# Patient Record
Sex: Male | Born: 1945
Health system: Southern US, Community
[De-identification: ages and names within clinical notes are randomized; demographics above are authoritative.]

## PROBLEM LIST (undated history)

## (undated) DIAGNOSIS — J449 Chronic obstructive pulmonary disease, unspecified: Secondary | ICD-10-CM

## (undated) DIAGNOSIS — I10 Essential (primary) hypertension: Secondary | ICD-10-CM

## (undated) DIAGNOSIS — E119 Type 2 diabetes mellitus without complications: Secondary | ICD-10-CM

## (undated) DIAGNOSIS — N400 Enlarged prostate without lower urinary tract symptoms: Secondary | ICD-10-CM

## (undated) DIAGNOSIS — K219 Gastro-esophageal reflux disease without esophagitis: Secondary | ICD-10-CM

## (undated) DIAGNOSIS — F419 Anxiety disorder, unspecified: Secondary | ICD-10-CM

## (undated) DIAGNOSIS — M1990 Unspecified osteoarthritis, unspecified site: Secondary | ICD-10-CM

## (undated) DIAGNOSIS — F329 Major depressive disorder, single episode, unspecified: Secondary | ICD-10-CM

## (undated) DIAGNOSIS — C8310 Mantle cell lymphoma, unspecified site: Secondary | ICD-10-CM

## (undated) DIAGNOSIS — F32A Depression, unspecified: Secondary | ICD-10-CM

## (undated) HISTORY — DX: Essential (primary) hypertension: I10

## (undated) HISTORY — PX: CERVICAL FUSION: SHX112

## (undated) HISTORY — DX: Chronic obstructive pulmonary disease, unspecified: J44.9

## (undated) HISTORY — DX: Depression, unspecified: F32.A

## (undated) HISTORY — DX: Mantle cell lymphoma, unspecified site: C83.10

## (undated) HISTORY — PX: OTHER SURGICAL HISTORY: SHX169

## (undated) HISTORY — DX: Major depressive disorder, single episode, unspecified: F32.9

## (undated) HISTORY — DX: Anxiety disorder, unspecified: F41.9

## (undated) HISTORY — PX: THYROIDECTOMY, PARTIAL: SHX18

## (undated) HISTORY — PX: TONSILLECTOMY: SUR1361

## (undated) HISTORY — DX: Benign prostatic hyperplasia without lower urinary tract symptoms: N40.0

---

## 1998-02-23 ENCOUNTER — Inpatient Hospital Stay (HOSPITAL_COMMUNITY): Admission: AD | Admit: 1998-02-23 | Discharge: 1998-02-25 | Payer: Self-pay | Admitting: Cardiology

## 1998-04-14 ENCOUNTER — Ambulatory Visit (HOSPITAL_COMMUNITY): Admission: RE | Admit: 1998-04-14 | Discharge: 1998-04-14 | Payer: Self-pay | Admitting: *Deleted

## 1998-04-14 ENCOUNTER — Encounter: Payer: Self-pay | Admitting: *Deleted

## 1998-12-21 ENCOUNTER — Inpatient Hospital Stay (HOSPITAL_COMMUNITY): Admission: EM | Admit: 1998-12-21 | Discharge: 1998-12-22 | Payer: Self-pay | Admitting: Cardiology

## 2001-12-04 ENCOUNTER — Ambulatory Visit (HOSPITAL_COMMUNITY): Admission: RE | Admit: 2001-12-04 | Discharge: 2001-12-04 | Payer: Self-pay | Admitting: Orthopaedic Surgery

## 2004-01-27 ENCOUNTER — Ambulatory Visit (HOSPITAL_COMMUNITY): Admission: RE | Admit: 2004-01-27 | Discharge: 2004-01-27 | Payer: Self-pay | Admitting: Family Medicine

## 2004-12-20 ENCOUNTER — Ambulatory Visit (HOSPITAL_COMMUNITY): Admission: RE | Admit: 2004-12-20 | Discharge: 2004-12-20 | Payer: Self-pay | Admitting: Podiatry

## 2005-03-05 ENCOUNTER — Ambulatory Visit (HOSPITAL_COMMUNITY): Admission: RE | Admit: 2005-03-05 | Discharge: 2005-03-05 | Payer: Self-pay | Admitting: Orthopaedic Surgery

## 2005-03-12 ENCOUNTER — Ambulatory Visit (HOSPITAL_COMMUNITY): Admission: RE | Admit: 2005-03-12 | Discharge: 2005-03-12 | Payer: Self-pay | Admitting: Orthopaedic Surgery

## 2005-03-12 ENCOUNTER — Encounter (INDEPENDENT_AMBULATORY_CARE_PROVIDER_SITE_OTHER): Payer: Self-pay | Admitting: Orthopaedic Surgery

## 2005-05-04 ENCOUNTER — Ambulatory Visit (HOSPITAL_COMMUNITY): Admission: RE | Admit: 2005-05-04 | Discharge: 2005-05-04 | Payer: Self-pay | Admitting: Orthopaedic Surgery

## 2005-05-10 ENCOUNTER — Ambulatory Visit (HOSPITAL_COMMUNITY): Admission: RE | Admit: 2005-05-10 | Discharge: 2005-05-10 | Payer: Self-pay | Admitting: Orthopaedic Surgery

## 2005-05-10 ENCOUNTER — Encounter (INDEPENDENT_AMBULATORY_CARE_PROVIDER_SITE_OTHER): Payer: Self-pay | Admitting: Orthopaedic Surgery

## 2005-05-17 ENCOUNTER — Ambulatory Visit (HOSPITAL_COMMUNITY): Admission: RE | Admit: 2005-05-17 | Discharge: 2005-05-17 | Payer: Self-pay | Admitting: Orthopaedic Surgery

## 2005-10-02 ENCOUNTER — Inpatient Hospital Stay (HOSPITAL_COMMUNITY): Admission: RE | Admit: 2005-10-02 | Discharge: 2005-10-06 | Payer: Self-pay | Admitting: Orthopaedic Surgery

## 2005-10-02 ENCOUNTER — Encounter (INDEPENDENT_AMBULATORY_CARE_PROVIDER_SITE_OTHER): Payer: Self-pay | Admitting: *Deleted

## 2006-05-15 ENCOUNTER — Ambulatory Visit: Payer: Self-pay | Admitting: Cardiology

## 2006-05-20 ENCOUNTER — Ambulatory Visit: Payer: Self-pay

## 2008-01-02 ENCOUNTER — Ambulatory Visit (HOSPITAL_COMMUNITY): Admission: RE | Admit: 2008-01-02 | Discharge: 2008-01-02 | Payer: Self-pay | Admitting: Orthopaedic Surgery

## 2008-03-25 ENCOUNTER — Inpatient Hospital Stay (HOSPITAL_COMMUNITY): Admission: RE | Admit: 2008-03-25 | Discharge: 2008-03-30 | Payer: Self-pay | Admitting: Orthopaedic Surgery

## 2009-04-26 ENCOUNTER — Encounter: Payer: Self-pay | Admitting: Cardiology

## 2009-04-27 ENCOUNTER — Ambulatory Visit (HOSPITAL_COMMUNITY): Admission: RE | Admit: 2009-04-27 | Discharge: 2009-04-27 | Payer: Self-pay | Admitting: Internal Medicine

## 2009-05-03 DIAGNOSIS — E669 Obesity, unspecified: Secondary | ICD-10-CM

## 2009-05-03 DIAGNOSIS — F17200 Nicotine dependence, unspecified, uncomplicated: Secondary | ICD-10-CM | POA: Insufficient documentation

## 2009-05-03 DIAGNOSIS — Z8679 Personal history of other diseases of the circulatory system: Secondary | ICD-10-CM | POA: Insufficient documentation

## 2009-05-03 DIAGNOSIS — I2510 Atherosclerotic heart disease of native coronary artery without angina pectoris: Secondary | ICD-10-CM

## 2009-05-03 DIAGNOSIS — I10 Essential (primary) hypertension: Secondary | ICD-10-CM | POA: Insufficient documentation

## 2009-05-19 ENCOUNTER — Ambulatory Visit (HOSPITAL_COMMUNITY): Admission: RE | Admit: 2009-05-19 | Discharge: 2009-05-19 | Payer: Self-pay | Admitting: Otolaryngology

## 2009-06-28 ENCOUNTER — Inpatient Hospital Stay (HOSPITAL_COMMUNITY): Admission: RE | Admit: 2009-06-28 | Discharge: 2009-07-01 | Payer: Self-pay | Admitting: Neurosurgery

## 2010-04-30 ENCOUNTER — Encounter: Payer: Self-pay | Admitting: Otolaryngology

## 2010-05-09 NOTE — Progress Notes (Signed)
Summary: Dr Keane Police Office Note  Dr Keane [employer] Office Note   Imported By: Roderic Ovens 09/28/2009 12:56:02  _____________________________________________________________________  External Attachment:    Type:   Image     Comment:   External Document

## 2010-06-29 ENCOUNTER — Other Ambulatory Visit (HOSPITAL_COMMUNITY): Payer: Self-pay | Admitting: Internal Medicine

## 2010-06-29 ENCOUNTER — Ambulatory Visit (HOSPITAL_COMMUNITY)
Admission: RE | Admit: 2010-06-29 | Discharge: 2010-06-29 | Disposition: A | Discharge status: Home / Self Care | Payer: BC Managed Care – PPO | Source: Ambulatory Visit | Attending: Internal Medicine | Admitting: Internal Medicine

## 2010-06-29 DIAGNOSIS — R0789 Other chest pain: Secondary | ICD-10-CM | POA: Insufficient documentation

## 2010-06-29 DIAGNOSIS — R05 Cough: Secondary | ICD-10-CM

## 2010-06-29 DIAGNOSIS — R52 Pain, unspecified: Secondary | ICD-10-CM

## 2010-06-29 DIAGNOSIS — J449 Chronic obstructive pulmonary disease, unspecified: Secondary | ICD-10-CM | POA: Insufficient documentation

## 2010-06-29 DIAGNOSIS — J4489 Other specified chronic obstructive pulmonary disease: Secondary | ICD-10-CM | POA: Insufficient documentation

## 2010-06-29 DIAGNOSIS — R059 Cough, unspecified: Secondary | ICD-10-CM | POA: Insufficient documentation

## 2010-07-03 LAB — CBC
HCT: 48.6 % (ref 39.0–52.0)
Hemoglobin: 16.4 g/dL (ref 13.0–17.0)
RBC: 5.34 MIL/uL (ref 4.22–5.81)
WBC: 9.1 10*3/uL (ref 4.0–10.5)

## 2010-07-03 LAB — BASIC METABOLIC PANEL
BUN: 18 mg/dL (ref 6–23)
CO2: 29 mEq/L (ref 19–32)
Calcium: 9.3 mg/dL (ref 8.4–10.5)
GFR calc Af Amer: >60 mL/min (ref >60)
GFR calc non Af Amer: >60 mL/min (ref >60)
Sodium: 134 mEq/L — ABNORMAL LOW (ref 135–145)

## 2010-07-03 LAB — SURGICAL PCR SCREEN: MRSA, PCR: NEGATIVE

## 2010-07-13 ENCOUNTER — Encounter (HOSPITAL_COMMUNITY): Payer: Medicare Other

## 2010-07-13 ENCOUNTER — Ambulatory Visit (HOSPITAL_COMMUNITY)
Admission: RE | Admit: 2010-07-13 | Discharge: 2010-07-13 | Disposition: A | Discharge status: Home / Self Care | Payer: Medicare Other | Source: Ambulatory Visit | Attending: Internal Medicine | Admitting: Internal Medicine

## 2010-07-13 DIAGNOSIS — R0609 Other forms of dyspnea: Secondary | ICD-10-CM | POA: Insufficient documentation

## 2010-07-13 DIAGNOSIS — R0989 Other specified symptoms and signs involving the circulatory and respiratory systems: Secondary | ICD-10-CM | POA: Insufficient documentation

## 2010-07-20 ENCOUNTER — Ambulatory Visit (INDEPENDENT_AMBULATORY_CARE_PROVIDER_SITE_OTHER): Payer: Medicare Other | Admitting: Gastroenterology

## 2010-07-20 ENCOUNTER — Encounter: Payer: Self-pay | Admitting: Gastroenterology

## 2010-07-20 VITALS — BP 156/86 | HR 70 | Temp 98.0°F | Ht 73.0 in | Wt 275.4 lb

## 2010-07-20 DIAGNOSIS — R1012 Left upper quadrant pain: Secondary | ICD-10-CM

## 2010-07-20 DIAGNOSIS — Z1211 Encounter for screening for malignant neoplasm of colon: Secondary | ICD-10-CM

## 2010-07-20 NOTE — Patient Instructions (Signed)
We have set you up for a colonoscopy. We will also retrieve your records from your colonoscopy in the past.  Continue to take Prilosec daily. At the time of the colonoscopy, we will decide if we need to add an upper endoscopy.  In the meantime, you may use heatpacks and extra strength tylenol for possible muscle strain.  No changes to medications

## 2010-07-20 NOTE — Progress Notes (Signed)
Referring Provider: Dwana Melena, MD Primary Care Physician:  Dwana Melena, MD Primary Gastroenterologist:  Dr. Jena Gauss  Chief Complaint  Patient presents with  . Pre-op Exam    colonoscopy    HPI:  Chris Lopez is a 65 y.o. male here as a referral from Dr. Margo Aye for a colonoscopy. Pt reports last colonoscopy >10 years ago; thinks it was done at [facility], however there is no record of this. He reports BM daily, no melena or brbpr, no abdominal pain. Does report LUQ pain, starting in January after episode of bronchitis with severe episodes of coughing. waxes and wanes, but somewhat underlying. Tenderness when pressing on it. Denies pain with eating, denies reflux. No dysphagia. LUQ discomfort worsened with movement/exertion. Riding in a car makes it more uncomfortable. Uses a pillow to hold against his abdomen.  Given Prilosec samples by PCP on April 4th. Has noticed some improvement of discomfort. No BC, Goodys, Ibuprofen.   Past Medical History  Diagnosis Date  . Hypertension   . Depression   . Anxiety   . COPD (chronic obstructive pulmonary disease)   . Chronic bronchitis   . BPH (benign prostatic hyperplasia)     Past Surgical History  Procedure Date  . Left knee replacement   . Left knee revised x 3   . Right knee replacement   . Tarsal tunnel repair   . Carpal tunnel right hand   . Cervical fusion   . Thyroidectomy, partial   . Tonsillectomy   . Multiple knee arthroscopies     Current Outpatient Prescriptions  Medication Sig Dispense Refill  . albuterol (VENTOLIN HFA) 108 (90 BASE) MCG/ACT inhaler Inhale 2 puffs into the lungs every 6 (six) hours as needed.        Marland Kitchen aspirin 325 MG tablet Take 325 mg by mouth daily.        . diazepam (VALIUM) 10 MG tablet Take 10 mg by mouth every 6 (six) hours as needed.        . diclofenac (VOLTAREN) 75 MG EC tablet Take 75 mg by mouth 2 (two) times daily.        . metoprolol (TOPROL-XL) 50 MG 24 hr tablet Take 50 mg by mouth daily.          . nitroGLYCERIN (NITROSTAT) 0.4 MG SL tablet Place 0.4 mg under the tongue every 5 (five) minutes as needed.        Marland Kitchen omeprazole (PRILOSEC) 20 MG capsule Take 20 mg by mouth daily.        Marland Kitchen oxyCODONE-acetaminophen (PERCOCET) 7.5-325 MG per tablet Take 1 tablet by mouth every 4 (four) hours as needed.        . pravastatin (PRAVACHOL) 40 MG tablet Take 40 mg by mouth daily.        . Tamsulosin HCl (FLOMAX) 0.4 MG CAPS Take 0.4 mg by mouth daily.        Marland Kitchen venlafaxine (EFFEXOR-XR) 150 MG 24 hr capsule Take 150 mg by mouth daily.        Marland Kitchen zolpidem (AMBIEN CR) 12.5 MG CR tablet Take 12.5 mg by mouth at bedtime as needed.          Allergies as of 07/20/2010 - Review Complete 07/20/2010  Allergen Reaction Noted  . Feldene (piroxicam) Rash 07/20/2010    Family History  Problem Relation Age of Onset  . Colon cancer Neg Hx     History   Social History  . Marital Status: Married   Social History Main  Topics  . Smoking status: Former Smoker -- 1.0 packs/day for 40 years    Types: Cigarettes    Quit date: 06/29/2010  . Smokeless tobacco: Never Used   Comment: using electronic cigarettes  . Alcohol Use: No  . Drug Use: No  . Sexually Active: Yes -- Male partner(s)    Birth Control/ Protection: None     spouse     Review of Systems: Gen: Denies any fever, chills, sweats, anorexia, fatigue, weakness, malaise, weight loss, and sleep disorder CV: Denies chest pain, angina, palpitations, syncope, orthopnea, PND, peripheral edema, and claudication. Resp: Denies dyspnea at rest, dyspnea with exercise, cough, sputum, wheezing, coughing up blood, and pleurisy. GI: See HPI GU : Denies urinary burning, blood in urine, urinary frequency, urinary hesitancy, nocturnal urination, and urinary incontinence. MS: Denies joint pain, limitation of movement, and swelling, stiffness, low back pain, extremity pain. Denies muscle weakness, cramps, atrophy.  Derm: Denies rash, itching, dry skin, hives,  moles, warts, or unhealing ulcers.  Psych: Denies depression, anxiety, memory loss, suicidal ideation, hallucinations, paranoia, and confusion. Heme: Denies bruising, bleeding, and enlarged lymph nodes.  Physical Exam: BP 156/86  Pulse 70  Temp 98 F (36.7 C)  Ht 6\' 1"  (1.854 m)  Wt 275 lb 6.4 oz (124.921 kg)  BMI 36.33 kg/m2  SpO2 96% General:   Alert,  Well-developed, well-nourished, pleasant and cooperative in NAD Head:  Normocephalic and atraumatic. Eyes:  Sclera clear, no icterus.   Conjunctiva pink. Ears:  Normal auditory acuity. Nose:  No deformity, discharge,  or lesions. Mouth:  No deformity or lesions, dentition normal. Neck:  Supple; no masses or thyromegaly. Lungs:  Clear throughout to auscultation.   No wheezes, crackles, or rhonchi. No acute distress. Heart:  Regular rate and rhythm; no murmurs, clicks, rubs,  or gallops. Abdomen:  +BS, soft, obese, non-distended, mildly TTP LUQ. No masses or hernias noted. No HSM. Rectal:  Deferred until time of colonoscopy.   Msk:  Symmetrical without gross deformities. Normal posture. Pulses:  Normal pulses noted. Extremities:  Without clubbing or edema. Neurologic:  Alert and  oriented x4;  grossly normal neurologically. Skin:  Intact without significant lesions or rashes. Psych:  Alert and cooperative. Normal mood and affect.

## 2010-07-24 ENCOUNTER — Encounter: Payer: Self-pay | Admitting: Gastroenterology

## 2010-07-24 DIAGNOSIS — Z1211 Encounter for screening for malignant neoplasm of colon: Secondary | ICD-10-CM | POA: Insufficient documentation

## 2010-07-24 DIAGNOSIS — R1012 Left upper quadrant pain: Secondary | ICD-10-CM | POA: Insufficient documentation

## 2010-07-24 NOTE — Assessment & Plan Note (Signed)
65 year old pleasant Caucasian male with a remote hx of prior colonoscopy; unable to find records. BM daily, no melena or hematochezia. No lower abdominal pain. No loss of appetite or wt loss. Will proceed with routine screening colonoscopy in near future.   Proceed with TCS with Dr. Jena Gauss in near future: the risks, benefits, and alternatives have been discussed with the patient in detail. The patient states understanding and desires to proceed.

## 2010-07-24 NOTE — Assessment & Plan Note (Addendum)
Onset in Jan of this year after severe episodes of coughing r/t bronchitis. Pt tender to palpation on exam. Yet, no exacerbation with eating/drinking. No reflux, dysphagia/odynophagia. However, Prilosec started April 4th with some mild improvement in symptoms. Pt seems to believe this is musculoskeletal in nature, which may be quite likely. However, will continue Prilosec daily, use therapeutic intervention such as heat packs and Tylenol prn. At time of colonoscopy, if no improvement, consider EGD.   The R/B/A of possible upper endoscopy was discussed with pt at time of visit. He states understanding. He was also offered a CT scan, but he has declined at this time.

## 2010-08-07 ENCOUNTER — Other Ambulatory Visit: Payer: Self-pay | Admitting: Internal Medicine

## 2010-08-07 ENCOUNTER — Ambulatory Visit (HOSPITAL_COMMUNITY)
Admission: RE | Admit: 2010-08-07 | Discharge: 2010-08-07 | Disposition: A | Discharge status: Home / Self Care | Payer: BC Managed Care – HMO | Source: Ambulatory Visit | Attending: Internal Medicine | Admitting: Internal Medicine

## 2010-08-07 ENCOUNTER — Encounter: Payer: Medicare Other | Admitting: Internal Medicine

## 2010-08-07 DIAGNOSIS — R1013 Epigastric pain: Secondary | ICD-10-CM | POA: Insufficient documentation

## 2010-08-07 DIAGNOSIS — D129 Benign neoplasm of anus and anal canal: Secondary | ICD-10-CM | POA: Insufficient documentation

## 2010-08-07 DIAGNOSIS — D128 Benign neoplasm of rectum: Secondary | ICD-10-CM | POA: Insufficient documentation

## 2010-08-07 DIAGNOSIS — I10 Essential (primary) hypertension: Secondary | ICD-10-CM | POA: Insufficient documentation

## 2010-08-07 DIAGNOSIS — Z1211 Encounter for screening for malignant neoplasm of colon: Secondary | ICD-10-CM | POA: Insufficient documentation

## 2010-08-07 DIAGNOSIS — K449 Diaphragmatic hernia without obstruction or gangrene: Secondary | ICD-10-CM | POA: Insufficient documentation

## 2010-08-07 DIAGNOSIS — K5730 Diverticulosis of large intestine without perforation or abscess without bleeding: Secondary | ICD-10-CM | POA: Insufficient documentation

## 2010-08-08 NOTE — Op Note (Signed)
NAME:  Chris Lopez, ERVEN                ACCOUNT NO.:  [id number]  MEDICAL RECORD NO.:  [id number]           PATIENT TYPE:  O  LOCATION:  DAYP                          FACILITY:  [facility]  PHYSICIAN:  R. Roetta Sessions, M.D. DATE OF BIRTH:  1945-05-20  DATE OF PROCEDURE:  08/07/2010 DATE OF DISCHARGE:                              OPERATIVE REPORT   PROCEDURE:  EGD with biopsy followed by colonoscopy with biopsy.  INDICATIONS FOR PROCEDURE:  A 65 year old gentleman is due for routine screening colonoscopy.  No family history of polyps or colon cancer. The last colonoscopy over 10 years ago.  No lower GI tract symptoms, has had some epigastric left upper quadrant abdominal pain for some time which has been felt to be due to musculoskeletal origin.  He has been on Prilosec 20 mg once daily with some improvement in these symptoms, but they continue.  No dysphagia, no hematemesis, nausea, vomiting.  Both EGD and colonoscopy now being done at this time.  Risks, benefits, limitations, alternatives, imponderables have been discussed, questions answered.  Please see the documentation in the medical record.  PROCEDURE NOTE:  O2 saturation, blood pressure, pulse, respirations were monitored throughout the entirety of the procedure.  CONSCIOUS SEDATION:  Versed 9 mg IV, Demerol 125 mg IV in divided doses, Phenergan 12.5 mg dilute slow IV push to augment conscious sedation. Cetacaine spray for topical pharyngeal anesthesia.  FINDINGS:  EGD examination of tubular esophagus revealed circumferential distal esophageal erosions within 5 mm of the GE junction.  There is no Barrett's esophagus or other abnormality.  EG junction was patent, easily traversed.  Stomach:  Gastric cavity was emptied and insufflated well with air. Thorough examination of gastric mucosa including retroflexion of proximal stomach, esophagogastric junction demonstrated antral erosions and submucosal petechiae and erosions in the  body and fundus as well. No ulcer infiltrating process.  There was a small hiatal hernia on retroflexion.  Pylorus was patent, easily traversed.  Examination of the bulb and second portion revealed two 1-cm pale yellow submucosal nodules consistent with lipomas, otherwise duodenum appeared normal through the second portion.  THERAPEUTIC/DIAGNOSTIC MANEUVERS PERFORMED:  Biopsies of the antrum and body were taken for H. pylori.  The patient tolerated this procedure, was repaired for colonoscopy.  Digital rectal exam revealed no abnormalities.  Endoscopic findings:  Prep was suboptimal, but doable. Colon:  Colonic mucosa was surveyed from the rectosigmoid junction through the left transverse right colon to the appendiceal orifice, ileocecal valve/cecum.  These structures were well seen and photographed for the record.  From this level, scope was slowly withdrawn.  All previously mentioned mucosal surfaces were again seen.  The patient has extensive left-sided diverticulum.  Remainder of colonic mucosa, however appeared normal.  Scope was pulled down to the rectum where a thorough examination of rectal mucosa including retroflexed view of the anal verge demonstrated two diminutive polyps which were cold biopsied/removed.  The patient tolerated this procedure well.  Cecal withdrawal time 10 minutes.  IMPRESSION: 1. EGD, distal esophageal erosions consistent with erosive reflux     esophagitis may have much to do with the patient's recent  upper     abdominal discomfort. 2. Small hiatal hernia gastric erosions described above status post     biopsy lipomatous lesions in the duodenum.  Colonoscopy findings,     diminutive rectal polyp status post cold biopsy removal, otherwise     unremarkable rectum. 3. Left-sided diverticula.  Remainder of colonic mucosa appeared     normal.  RECOMMENDATIONS: 1. Increase Prilosec to 20 mg orally twice daily for now. 2. Literature on reflux and hiatal  hernia provided to Mr. Presley. 3. Follow up on path. 4. Further recommendations to follow.     Jonathon Bellows, M.D.     RMR/MEDQ  D:  08/07/2010  T:  08/07/2010  Job:  [id number]  cc:   Catalina Pizza, M.D. Fax: [phone]  Electronically Signed by Lorrin Goodell M.D. on 08/08/2010 07:53:00 PM

## 2010-08-22 NOTE — Consult Note (Signed)
NAME:  Chris Lopez, Chris Lopez                ACCOUNT NO.:  [id number]   MEDICAL RECORD NO.:  [id number]          PATIENT TYPE:  INP   LOCATION:  A332                          FACILITY:  [facility]   PHYSICIAN:  Lonia Blood, M.D.      DATE OF BIRTH:  Sep 07, 1945   DATE OF CONSULTATION:  DATE OF DISCHARGE:                                 CONSULTATION   DATE OF CONSULT:  March 26, 2008.   CONSULT REQUESTED:  [name], MD   REASON FOR CONSULTATION:  Hypertension, dyslipidemia.   HISTORY OF PRESENT ILLNESS:  The patient is a 65 year old gentleman with  known history of degenerative joint disease who was admitted by Dr.  Hilda Lias for right total knee replacement.  The patient has a total knee  replacement done on March 25, 2008.  He has been recovering now in  the hospital.  He has underlying history of hypertension and also has  probably developed thrombocytopenia and other medical issues, hence we  are being consulted.  The patient is currently recuperating.  Denied any  specific complaints.  No dizziness.  He is on bedrest today.   PAST MEDICAL HISTORY:  Significant for hypertension and dyslipidemia.   ALLERGIES:  Feldene.   PAST SURGICAL HISTORY:  Status post multiple total knee surgery on the  left side who usually follows with [facility].   SOCIAL HISTORY:  The patient lives in [place].  Denied any tobacco,  alcohol, or IV drug use.  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  The patient is married, with 3 children.   FAMILY HISTORY:  Positive for premature coronary artery disease in one  of his brothers that died at the age of 90 from a heart attack.   REVIEW OF SYSTEMS:  A 14-point review of systems is negative except per  HPI.   PHYSICAL EXAMINATION:  VITAL SIGNS:  His temperature is 99.1, blood  pressure 125/68, pulse 102, and respiratory rate 20.  His sats 91% on 2  liters.  GENERAL:  The patient is awake, alert, conversant, in no acute  distress.  HEENT:  PERRL.  EOMI.  NECK:  Supple.  No JVD, no lymphadenopathy.  RESPIRATORY:  He has good air entry bilaterally.  No wheezes, no rales.  CARDIOVASCULAR SYSTEM:  The patient has S1 and S2.  No murmur.  ABDOMEN:  Soft, obese, nontender with positive bowel sounds.  EXTREMITIES:  No edema, cyanosis, or clubbing.  The right knee is  currently immobilized with pump in place postoperatively.   CURRENT MEDICATIONS:  1. Albuterol 2.5 mg inhale nebulizer.  2. Dilaudid 7.5 mg IV q.4 h. PCA pump as well as p.r.n. medications.   LABORATORY DATA:  Most recent labs today showed a white count of 9.7,  hemoglobin 12.5, and platelet count of 143.  Sodium 135, potassium 3.8,  chloride 101, CO2 29, glucose 150, BUN 8, creatinine 0.92, and calcium  8.4.   ASSESSMENT:  Therefore, this is consult for [name], status  post total knee replacement on the right, also had his baseline history  of hypertension, although his blood pressure is controlled today.  The  patient also had history of dyslipidemia and currently, has  thrombocytopenia and postoperative anemia.  A thrombocytopenia could be  due to heparin-induced thrombocytopenia or reactionary.  His anemia has  been borderline and could be postoperative in nature.  For his  hypertension, the patient's blood pressure is fully controlled on no  medication at the moment.  He was apparently not taking medicine before  coming in, although last year, he was on amlodipine.   RECOMMENDATIONS:  We will check fasting lipid panel and follow his CBCs  to see if his platelets continue to drop.  We will check screen for  heparin-induced thrombocytopenia.  We will continue his home medication  and continue to hold antihypertensives until his blood pressure rises.  We will follow up with you.  Thank you for the consult.      Lonia Blood, M.D.  Electronically Signed     LG/MEDQ  D:  03/27/2008  T:  03/27/2008  Job:  [id number]

## 2010-08-22 NOTE — H&P (Signed)
NAME:  Chris Lopez, Chris Lopez                ACCOUNT NO.:  [id number]   MEDICAL RECORD NO.:  [id number]          PATIENT TYPE:  AMB   LOCATION:  DAY                           FACILITY:  [facility]   PHYSICIAN:  J. Darreld Mclean, M.D. DATE OF BIRTH:  Feb 17, 1946   DATE OF ADMISSION:  DATE OF DISCHARGE:  LH                              HISTORY & PHYSICAL   CHIEF COMPLAINT:  Severe pain and tenderness in the right knee.   The patient is a 65 year old male with marked degenerative joint disease  of the right knee.  It has gotten progressively worse over the last  several years, so he would like to have a total knee arthroplasty.   He is status post total knee arthroplasty on the right originally done  by me October 02, 2005.  After surgery he did well but then he complained  of pain around the patella that became chronic.  There were no signs of  infection.  His motion was good but he complained of patella pain.  I  had him seen eventually at [facility], where they thought  that he had an oversized patella and in July of 2008 they did a revision  where they removed the patella button and placed a new patella button.  The prosthesis was found to be satisfactory and that was not removed.  Since then, they have done a total revision of the left total knee and  then he was taken to surgery in October of this year where he had  another surgical procedure on the left, with removal of retained bone  and cement fragments.  He is very familiar in other words about total  knee arthroplasty.  He has talked to his surgeon at [facility]  and he would rather have this right total knee done here in [place].  His surgeon feels that he can tolerate increased weight to the left knee  now.  He saw his surgeon just recently.  The patient would like to  proceed with total knee arthroplasty on the right.  Again, he  understands the risks and imponderables of the procedure.   Other than the multiple  procedures on his left knee for the total knee,  he has also had arthroscopy of the left knee in 1993.  He had a  thyroidectomy in 1992.  He has had the right knee arthroscopic surgery  in 1998 in [place], in 2000 in [place], [place], and in 2003 by me.  He  is also status post tarsal tunnel and [place] release in 2006.  The  patient has a history of heart disease.  He has had no significant  problems with his heart or arrhythmias.  He denies any other serious  medical problem except for the multiple knee surgeries.   ALLERGIES:  HE IS ALLERGIC TO FELDENE.   He takes Percocet 7.5/325 for pain, Voltaren 75 mg p.o. b.i.d., diazepam  10 mg p.r.n. spasms, and Ambien 10 mg.   The patient smokes, does not use alcoholic beverages.  Dr. Morrie Sheldon is his  family doctor.  The patient lives  in [place].   VITAL SIGNS:  Normal.  GENERAL:  He is alert, cooperative, and oriented.  HEENT:  Negative.  NECK:  Supple.  LUNGS:  Clear to P and A.  HEART:  Regular rhythm without murmur heard.  ABDOMEN:  Soft, nontender, without masses.  EXTREMITIES:  A well-healed scar, left knee, with good tracking of his  knee, with range of motion from 0 to 95 degrees.  On the right knee, he  has good range of motion from 0 to 90 degrees with pain and tenderness,  and he has a slight varus deformity.  He has crepitus.  The knee is  slightly unstable anteriorly about one-half +.  He has no distal edema.  The other extremities are negative.  CNS:  Neurovascularly intact.   IMPRESSION:  1. Status post multiple total knees on the left.  2. Significant degenerative joint disease of the right knee for a      total knee arthroplasty.   I discussed with the patient the planned procedure risks and  imponderables. He appears to understand.  Labs are pending.   Several years ago the patient had a MRSA infection and because of that,  it has been recommended he have vancomycin given preoperatively.  This  been done at [facility]  on his multiple revisions and it is again  recommended that it be done here through the infection control service  at [facility].  I will honor their request and he will be  having vancomycin preoperatively.                                            ______________________________  Shela Commons. Darreld Mclean, M.D.     JWK/MEDQ  D:  03/24/2008  T:  03/24/2008  Job:  [id number]

## 2010-08-22 NOTE — Group Therapy Note (Signed)
NAME:  Chris Lopez, Chris Lopez                ACCOUNT NO.:  [id number]   MEDICAL RECORD NO.:  [id number]          PATIENT TYPE:  INP   LOCATION:  A332                          FACILITY:  [facility]   PHYSICIAN:  Margaretmary Dys, M.D.DATE OF BIRTH:  07/03/1945   DATE OF PROCEDURE:  03/28/2008  DATE OF DISCHARGE:                                 PROGRESS NOTE   SUBJECTIVE:  The patient feels fairly well.  Patient is status post  right total knee replacement on December 17.  The patient has very  minimal pain.  We are following the patient secondary to hypertension,  dyslipidemia and thrombocytopenia.  The patient's blood pressure has  really remained stable with no concerns at this time.   OBJECTIVE:  Conscious, alert, comfortable, not in acute distress.  Well-  oriented in time, place and person.  VITAL SIGNS:  Blood pressure was 118/69, pulse of 111, respirations 24,  temperature 98.7 degrees Fahrenheit.  Oxygen saturation was 93% on room  air.  HEENT EXAM:  Normocephalic, atraumatic.  Oral mucosa was moist.  No  exudates.  NECK:  Supple.  No JVD, lymphadenopathy.  LUNGS:  Clear clinically, good air entry bilaterally.  HEART:  S1 and S2 regular.  ABDOMEN:  Soft.  EXTREMITIES:  Right leg in a splint, mobile splint machine.  Toes feel  warm.  CNS:  Exam was grossly intact with no focal neurological deficits.   LABORATORY/DIAGNOSTIC DATA:  White blood count was 11.2, hemoglobin  11.6, hematocrit 34.7, platelet count was 148,000.  Sodium is 132,  potassium 3.6, chloride 96,  CO2 was 29, BUN 7, creatinine was 0.77.  Heparin-induced thrombocytopenia panel was negative.   ASSESSMENT/PLAN:  1. Hypertension well-controlled.  2. History of dyslipidemia.  3. Thrombocytopenia, possibly consumptive thrombocytopenia.   PLAN:  1. Will continue to follow his platelet counts at this time.  The      benefit of continuing with Lovenox outweighs the      risks at this time.  His platelet count has actually  showed some      improvement.  2. The patient will likely need rehab.  I will defer this to Dr.      Hilda Lias.   DISPOSITION:  Overall the patient remains stable and from the medical  standpoint, I think we can discharge him.      Margaretmary Dys, M.D.  Electronically Signed     AM/MEDQ  D:  03/28/2008  T:  03/28/2008  Job:  [id number]

## 2010-08-22 NOTE — Op Note (Signed)
NAME:  Chris Lopez, Chris Lopez                ACCOUNT NO.:  [id number]   MEDICAL RECORD NO.:  [id number]          PATIENT TYPE:  INP   LOCATION:  A332                          FACILITY:  [facility]   PHYSICIAN:  J. Darreld Mclean, M.D. DATE OF BIRTH:  12-Mar-1946   DATE OF PROCEDURE:  03/25/2008  DATE OF DISCHARGE:                               OPERATIVE REPORT   PREOPERATIVE DIAGNOSIS:  Severe degenerative joint disease of the right  knee.   POSTOPERATIVE DIAGNOSIS:  Severe degenerative joint disease of the right  knee.   PROCEDURE:  Right knee arthroplasty using a #6 component femoral and #6  component tibial, an 11-mm tibial wafer, and a 29-mm, 9-mm tall patellar  button with methyl methacrylate.   ANESTHESIA:  Spinal.   SURGEON:  J. Darreld Mclean, MD   TOURNIQUET TIME:  79 minutes.  One large Hemovac drain used.  No  apparent complications, no blood given.   The patient is a 65 year old male with severe degenerative joint disease  of the right knee.  He has had a total knee arthroplasty on the left  with 3 revisions.  Please refer to the history and physical.  His has  had trouble with the right.  He would like to go with surgery on the  right knee.  The patient has a history in the past of MRSA and  vancomycin has been recommended and strongly recommended for preop  antibiotics.  The patient understands the risks and imponderables of the  procedure.  I have gone over these on multiple occasions plus he has had  multiple surgeries on the left knee.   DESCRIPTION OF PROCEDURE:  The patient was seen in the holding area.  The right knee was identified as correct surgical site by the patient  and then I put a mark on the right knee.  The patient put a mark on the  right knee.  He was brought to the operating room, given spinal  anesthesia.  He was placed supine.  Tourniquet placed deflated right  upper thigh.  He was prepped and draped in usual manner.  He had a  universal time-out  identifying the patient as Chris Lopez and doing the  right knee for total knee arthroplasty.  All members of the surgical  team knew each other and were introduced.  All instrumentations were  deemed to be present and working properly.   Leg was elevated, wrapped circumferentially with Esmarch bandage,  tourniquet inflated 300 mmHg.  Esmarch bandage removed.  Midline  incision made.  Parapatellar incision was made medially.  Patella was  everted.  Knee had significant degenerative joint disease present in all  compartments.  Initial drill hole was made and the cutting block was  then inserted on the femur.  I elected to a 0-cut at 11 mm.  Cuts were  made.  Tension directed.  The tibia was exposed and an 11-mm was taken  off of the lower side.  A spacing bag was placed for extension of the  test and it fit well without any undue tightness or any undue looseness  medially or laterally.  Attention directed back to the femoral side.  The femoral side was measured, it measured to size 6.  It should be  noted that the tibia was also measured and it measured to size 6 as  well.  The cutting block was made for the size 6.  Cuts were made  anterior-posterior, posterior chamfer, and anterior chamfer.  The  central cutting block was used and a large circular cutting jig was used  with power and then the box osteotome was used.  Femoral side was then  completed.  Trial reduction carried out and fit extremely well.  This  was kept in and then a trial reduction was carried out and a size 9  wafer was used on the tibia and a size 11/11 fit best.  These were all  removed after marking where the tibia rode the best and flexion and  extension was tested and fit very nicely.  Both flexion and extension  without any undue looseness and undue laxity.  The patella was then  prepared.  The patella measured 26 mm, rather large patella.  Cuts were  made to accommodate a 9-mm wafer.  Cut was made to 17 mm,  rechecked with  a caliber, was smoothened and leveled.  The 29 button was selected.  Drill holes were made.  The femur, tibia, and patellar area were  debrided with [place] while the methyl methacrylate layer was mixed up  by myself.  It was placed in a caulk gun after mixing and under vacuum.  The tibial component was put in first, impacted, excess glue removed.  Femoral component was placed, impacted, excess glue removed.  The tibial  wafer 11 mm was then inserted with excellent visualization and impacted,  fit very well.  Attention directed to patella button and this was then  inserted and compression applied to it with a special gage.  After glue  hardness was removed.  Patella tracked very nicely after putting these  few sutures and using a no-thumb technique.  There was no excess  looseness and that rode very well, clotted very well, and did not seem  to be extra big.  Hemovac drain was placed, sewn with 2-0 silk.  The  fascia was reapproximated using interrupted figure-of-eight #1 Surgilon  suture.  X-rays were taken.  On the true AP and true lateral view they  looked good and we will repeat these in recovery we will get true AP and  true lateral views.  There was no access bone cement seen or access bony  particles left.  Tourniquet was deflated for 79 minutes and then  subcutaneous tissue was examined and no excessive bleeding in this was  repaired using a 2-0 plain in layers.  Staples were then used and closed  the wound.  Sterile dressing was applied and a cryo cuff applied.  Hemovac was put to dissection.  The patient tolerated the procedure well  and went to recovery in good condition.  We placed in a CPM machine.  Morphine PCA has been ordered.  I will have a hospitalist see him.  We  will continue the vancomycin for one more dose later this evening.  Enoxaparin has been ordered postoperatively.           ______________________________  Shela Commons. Darreld Mclean, M.D.      JWK/MEDQ  D:  03/25/2008  T:  03/26/2008  Job:  [id number]

## 2010-08-25 NOTE — Op Note (Signed)
NAME:  Chris Lopez, Chris Lopez                ACCOUNT NO.:  [id number]   MEDICAL RECORD NO.:  [id number]          PATIENT TYPE:  AMB   LOCATION:  DAY                           FACILITY:  [facility]   PHYSICIAN:  Denny Peon. Ulice Brilliant, D.P.M.  DATE OF BIRTH:  May 15, 1945   DATE OF PROCEDURE:  12/20/2004  DATE OF DISCHARGE:                                 OPERATIVE REPORT   PREOPERATIVE DIAGNOSIS:  Plantar fasciitis, left foot. Tarsal tunnel  symptoms, left medial ankle and foot.   POSTOPERATIVE DIAGNOSIS:  Plantar fasciitis, left foot. Tarsal tunnel  symptoms, left medial ankle and foot.   PROCEDURE PERFORMED:  Endoscopic plantar fasciotomy, left foot. Tarsal  tunnel release, left medial ankle.   SURGEON:  Denny Peon. Ulice Brilliant, D.P.M.   ANESTHESIA:  Spinal.   INDICATIONS FOR SURGERY:  Several month to two-year history of pain in  medial and central and plantar heel with classic plantar fasciitis heel spur  symptoms and secondly burning pain in medial ankle extending into heel and  lateral aspect of the foot with some neuritis/anesthesia symptoms. He is  noted clinically to have absent epicritic sensation of the fourth and fifth  digits suggestive of a lateral plantar nerve entrapment.   DESCRIPTION OF PROCEDURE:  Arul is brought into the OR and placed on the  table in the supine position. He is then rotated over onto his side where  spinal anesthesia is induced. Within two minutes, the anesthesia had taken  effect. Local anesthesia is obtained about the medial aspect of his left  ankle utilizing 1% lidocaine with epinephrine 1:100,000. The purpose for the  lidocaine with epinephrine is for hemostasis during the tarsal tunnel  release. Following the injection of lidocaine with epinephrine, a pneumatic  ankle tourniquet is applied across his left ankle for use during the  endoscopic plantar fasciotomy. The left foot is then prepped and draped for  the EPF in standard aseptic fashion. An Esmarch bandage utilized  to  exsanguinate the foot, and the tourniquet is inflated to 250 mmHg.   PROCEDURE #1:  ENDOSCOPIC PLANTAR FASCIOTOMY OF LEFT FOOT. Attention is  directed to the medial aspect of the first MTP. The medial calcaneal  tubercle is appreciated. One centimeter distal to the palpable portion of  the calcaneal tubercle, a 1-cm vertical incision is made. The incision is  deepened via blunt dissection utilizing a curved Hemostat. A freer elevator  is introduced and utilized to create a channel just inferior to the plantar  fascia working from medial to lateral. This is broadened utilizing the  spatula from the EPF instrumentation. The obturator cannula is driven  through this tunnel, and a separate incision was made on the lateral aspect  of the heel to pass this through. The obturator was removed. The cannula was  rotated so that its slot laid against the fascia. The fiberoptic equipment  is inserted medially. A hook probe was inserted laterally. Prior to the  procedure, I palpated Gagan's heel, and he is painful almost across the  entire width of the heel, worse centrally and medially than laterally. The  lateral  most aspect of the central band of the fascia is approximated, and a  transection is made utilizing the triangle knife in this portion of the  fascia. Working from lateral to medial, the plantar fascia is transected  utilizing the triangle knife. Following this, the fiberoptic equipment is  removed and inserted laterally, and the orientation is reversed. At this  point, any further remaining fibers that were not cut were released. The  intrinsic muscle belly just superior to the fascia is well visualized on the  screen. The cannula was then rotated so that its slot laid plantar, and the  foot is inspected for any remaining fascia here if the cannula had somehow  driven through the body of the fascia itself. There is no ligament noted;  all adipose tissue. The EPF is deemed complete. The  wound is flushed. Both  incisions following removal of the cannula are closed utilizing 4-0 Prolene.  Two x 2s are folded over, and a Tegaderm dressing is applied over the  incision at this point. The tourniquet was then deflated. The patient's left  foot and leg are then removed of draping and the tourniquet and padding  material. The foot is then prepped up to about the lower one third of the  leg in standard fashion and then draped for the tarsal tunnel release.   PROCEDURE #2:  TARSAL TUNNEL RELEASE, LEFT MEDIAL ANKLE. Attention is  directed to the left medial ankle. A 7-cm slight hockey-stick skin incision  is made. The incision is deepened by sharp and blunt dissection through  subcutaneous tissues, all crossing vessels that could not be retracted or  clamped and cauterized. Slow careful blunt dissection is utilized to get  good exposure to the laciniate ligament. The superior and inferior  boundaries of this ligament are appreciated. Working from superior to  inferior, this ligament is then completed transected, utilizing a  combination of Metzenbaum scissors, iris scissors, and #15 blade. Following  complete severing of the ligament, the underlying contents of the tarsal  compartments are visualized. The posterior tibial artery is noted to be very  evident and appreciated. Care is taken that there is no crossing vessels  across the content of the tarsal canal at this point. Care is taken also  that there is good opening of the porta pedis inferiorly. During the  dissection, one of the veins running along the posterior tibial artery is  nicked. This is stopped of bleeding, utilizing cautery. The wound is flushed  with copious amounts of irrigant. No active bleeding is noted. Subcutaneous  tissues are reapproximated and closed, utilizing a horizontal mattress of 4-  0 Vicryl. Skin is closed with 4-0 Vicryl and subcuticular suture. Steri- Strips are applied across this incision. The  Tegaderm dressings are removed,  and the entire left foot and lower leg are dressed using a combination of a  2 x 2s, 3 x 3s, 4 x 4s, and Kerlix dressing. Prior to application of the  dressing material, a postoperative injection is administered to both the  ankle and the heel. Utilizing 0.5% Marcaine and Hexadrol. An Ace bandage is  applied across the Kerlix dressing. Tage was then removed of all draping  material.   Mr. Hal Hope tolerates the anesthesia and procedure well. During the last  portion of the skin closure, he was started to regain feeling in the foot.  While in postoperative recovery, he had complete feeling of the lower aspect  of his foot, and he had really no  pain in the medial ankle or heel secondary  to the Marcaine use. Following the procedure, I had a discussion with his  wife about postoperative care. A prescription for Tylox and Phenergan is  dispensed. He will be seen within seven days for first postoperative visit.           ______________________________  Denny Peon. Ulice Brilliant, D.P.M.     CMD/MEDQ  D:  12/20/2004  T:  12/20/2004  Job:  [id number]

## 2010-08-25 NOTE — H&P (Signed)
NAME:  Chris Lopez, Chris Lopez                ACCOUNT NO.:  [id number]   MEDICAL RECORD NO.:  [id number]          PATIENT TYPE:  AMB   LOCATION:  DAY                           FACILITY:  [facility]   PHYSICIAN:  J. Darreld Mclean, M.D. DATE OF BIRTH:  August 01, 1945   DATE OF ADMISSION:  DATE OF DISCHARGE:  LH                                HISTORY & PHYSICAL   CHIEF COMPLAINT:  My knee hurts, on the left.   The patient is a 65 year old male with pain and tenderness of his left knee  for several weeks.  I saw him in the office, March 05, 2005, with  complaints of pain and tenderness of his left knee that has been going on  for several months.  He recently had surgery by Dr. Ulice Brilliant on his foot but he  has had pain and tenderness to his knee.  Surgery was on the left foot.  There has been giving way of the knee, swelling.  I previously did an  arthroscopy on his right knee in 2003 and he says the left knee feels just  like that did.  I obtained an MRI through the hospital which showed a joint  effusion and tear of the medial and lateral menisci.  There was tri-  compartmental arthritis present.  Surgery is recommended.  He is agreeable  to this.   PAST HISTORY:  Heart disease.   ALLERGIES:  FELDENE.   He takes __________  and Flomax.   He smokes a pack a day.  He does not use alcohol.  He is a Engineer, maintenance (IT).   Dr. Morrie Sheldon is his family doctor.   SURGERY:  1.  Recent surgery on the right foot and heel.  2.  Right knee arthroscopy, by me, 2003.  3.  Right knee, 2000, while he was in [place].  4.  Right knee, 1998, at [facility].  5.  Thyroidectomy, 1992, in [place].  6.  Left knee surgery in 1993 in [place].   Prostate cancer runs in the family.   [place]   PHYSICAL EXAMINATION:  VITAL SIGNS:  Within normal limits.  HEENT:  Negative.  GENERAL:  He is alert, cooperative, oriented.  NECK:  Supple.  LUNGS:  Clear to P&A.  HEART:  Regular without murmur  heard.  ABDOMEN:  Soft, nontender without masses.  EXTREMITIES:  Left knee with slight effusion, tenderness particularly  medially.  Range of motion is good.  Some crepitus is present.  The right  knee is full range of motion.  Other extremities are negative.  CNS:  Intact.  SKIN:  Intact.   IMPRESSION:  1.  Tear of the medial and lateral meniscus of the left knee.  2.  Recent surgery, left foot.   PLAN:  Arthroscopy of the left knee.  Risks and imponderables have been  explained.  Labs are pending.  He understands the procedure having undergone  it on the other side.  ______________________________  Shela Commons. Darreld Mclean, M.D.     JWK/MEDQ  D:  03/09/2005  T:  03/09/2005  Job:  [id number]

## 2010-08-25 NOTE — Assessment & Plan Note (Signed)
[facility]                            CARDIOLOGY OFFICE NOTE   NAME:Chris Lopez                       MRN:          [id number]  DATE:05/15/2006                            DOB:          02-Nov-1945    CHIEF COMPLAINT:  I am having my angina again.   HISTORY OF PRESENT ILLNESS:  [name] is a pleasant 65 year old  gentleman who carries a diagnosis of angina since late 1998.  At that  time he had a heart catheterization which showed a 40% - 50% blockage in  the circumflex.  He pointed this out to me on the heart monitor today.   He had recurrent problems in 2000 and was apparently catheterized at  [facility].  We can not find records of that.  He was told then  that there was no blockage.  He has normal left ventricular function.   Christmas Eve he was under a lot of stress and had severe chest  pressure.  He had a little radiation to the left neck.  There was no  other concomitant symptoms.   He has had some light tach since then.   It is not provoked by exertion or activity.  He is fairly immobile now,  walking with a cane secondary to left foot and left knee surgery.   PAST MEDICAL HISTORY:  Has multiple risk factors including age, sex, and  tobacco use of a pack per day.  He is overweight and very sedentary, he  does not know his lipid status.  He is not diabetic.  He does have some  hypertension treated with amlodipine.  The rest of his past medical  history is significant for left knee replacement 2007, heel spurs on  left foot in 2006, right knee arthroscopy, partial thyroidectomy, and  tonsillectomy.  He is not on thyroid replacement therapy.   CURRENT MEDICATIONS:  1. Amlodipine 5 mg daily.  2. Oxycodone p.r.n.  3. Diclofenac 75 mg post-meals.  4. Aspirin 81 mg a day which is on hold.  5. Diazepam 5 mg two every day.   He does carry nitroglycerin.   HE IS INTOLERANT OF FELDENE.   FAMILY HISTORY:  Positive for  premature coronary disease in a brother  who died at 37 of a heart attack.   SOCIAL HISTORY:  He is a [employer] manager, Emergency planning/management officer.  He is  married and has 3 children.   REVIEW OF SYSTEMS:  Other than the HPI is remarkable for a hiatal  hernia, some urinary frequency problems, arthritis which is chronic,  diabetes which I do not get any history of otherwise, and anxiety.   PHYSICAL EXAMINATION:  He is very pleasant, he is bearded, he is no  acute distress.  He is alert and oriented x3.  Blood pressure is 140/92,  his pulse is 76 and regular, his weight is 267, he is 6 foot 1 inch.  HEENT:  Normocephalic/atraumatic, PERRLA, extraocular movements intact,  sclerae are clear.  He wears glasses, facial symmetry is normal.  NECK:  Supple, carotid upstrokes are equal bilaterally without bruits,  there is no JVD, thyroid is not enlarged, trachea is midline.  LUNGS:  Clear to auscultation.  HEART:  Reveals a soft S1, S2, PMI could not be appreciated.  ABDOMINAL:  Was protuberant, good bowel sounds, there is no tenderness.  Organomegaly could not be assessed.  EXTREMITIES:  Reveal a tender left ankle.  There was no obvious edema.  Pulses, posterior tibias were equal bilaterally.  NEUROLOGIC:  Grossly intact.  SKIN:  Grossly intact.   Electrocardiogram shows normal sinus rhythm and a normal EKG.   ASSESSMENT:  [name] apparently has had some angina in the past.  Catheterization in 1998 apparently showed nonobstructive disease in the  circumflex.  Catheterization in 2000 he was told was normal.   He has multiple cardiac risk factors as he and I discussed today.  We  discussed at length therapeutic lifestyle changes that would have a  favorable influence on his future.   PLAN:  Adenosine stress test Myoview.  If he has substantial ischemia he  will need a heart catheterization.  He and I are hoping we can avoid  this.   He has had lipids and blood work done at [facility].  I have  told him he probably needs a statin with his multiple risk factors.  He  will follow up with them concerning that.     Thomas C. Daleen Squibb, MD, [facility]  Electronically Signed    TCW/MedQ  DD: 05/15/2006  DT: 05/15/2006  Job #: [id number]   cc:   Ernestina Penna, M.D.

## 2010-08-25 NOTE — H&P (Signed)
NAME:  Chris Lopez, Chris Lopez                ACCOUNT NO.:  [id number]   MEDICAL RECORD NO.:  [id number]          PATIENT TYPE:  AMB   LOCATION:  DAY                           FACILITY:  [facility]   PHYSICIAN:  J. Darreld Mclean, M.D. DATE OF BIRTH:  01-Nov-1945   DATE OF ADMISSION:  05/10/2005  DATE OF DISCHARGE:  LH                                HISTORY & PHYSICAL   CHIEF COMPLAINT:  My knee is hurting again.   The patient is a 65 year old male on whom I did knee arthroscopy on his left  knee December 4.  He had a partial medial meniscectomy.  Postoperatively, he  initially did well, but then he started having more pain and tenderness in  this knee after undergoing physical therapy in the last several weeks.  The  pain has not improved.  I have injected the knee which has not helped.  Physical therapy has not helped.  Pain medicine has not helped.  Repeat MRI  has just been done here at the hospital on January 26.  This shows joint  effusion, DJD.  There is some subchondral edema, and he is post partial  meniscectomy.  Radiologist is suspicious there may be a recurrent tear.  Cannot identify displacement or meniscal fragment.   The patient's increased pain with MRI showing changes and no improvement  with his therapy, I think repeat arthroscopy is warranted.  The patient  appears to understand.   The patient is familiar with risks and imponderables.   PAST MEDICAL HISTORY:  Heart disease.   ALLERGIES:  He is allergic to [facility].   CURRENT MEDICATIONS:  BuSpar and Flomax.   He smokes a pack a day and does not use alcohol.  He is a Engineer, maintenance (IT).   Dr. Morrie Sheldon is his family physician.   PREVIOUS SURGERY:  1.  Right foot and heel.  2.  Right knee arthroscopy in 2003.  3.  Left knee arthroscopy this past December 2006.  4.  Right knee surgery in 2000 while in [place].  5.  Right knee surgery in 1998 at [facility].  6.  Thyroidectomy in 1992 in [place].  7.  Left knee surgery in 1993 in  [place].   Prostate cancer runs in the family.   The patient lives in [place].   PHYSICAL EXAMINATION:  VITAL SIGNS: Within normal limits.  HEENT:  Negative.  GENERAL:  Cooperative and oriented.  NECK:  Supple.  LUNGS:  Clear to percussion and auscultation.  HEART: Regular rate and rhythm.  No murmur heard.  ABDOMEN:  Soft, nontender with good bowel sounds.  Slightly obese.  EXTREMITIES:  Left knee has effusion, pain, tenderness medially.  Range of  motion of the knee is very good.  There is crepitus.  Right knee has full  range of motion, no pain, no tenderness.  He has a limp to the left when  walking.  CNS: Intact.  SKIN:  Intact.   IMPRESSION:  Recurrent tear of the left knee medial meniscus.   PLAN:  Discussed with patient plan and procedure, appears to understand  risks and  imponderables.  Surgery is scheduled for February 1.                                            ______________________________  J. Darreld Mclean, M.D.     JWK/MEDQ  D:  05/08/2005  T:  05/08/2005  Job:  [id number]

## 2010-08-25 NOTE — H&P (Signed)
NAME:  Chris Lopez, Chris Lopez                ACCOUNT NO.:  [id number]   MEDICAL RECORD NO.:  [id number]          PATIENT TYPE:  AMB   LOCATION:  DAY                           FACILITY:  [facility]   PHYSICIAN:  Denny Peon. Ulice Brilliant, D.P.M.  DATE OF BIRTH:  [birth year]   DATE OF ADMISSION:  12/20/2004  DATE OF DISCHARGE:  LH                                HISTORY & PHYSICAL   HISTORY OF PRESENT ILLNESS:  Chris Lopez has a longstanding problem with pain in  the left heel, foot, and ankle present since probably Christmas-time of  2004.  He originally was seen in our office April 05, 2004.  He was  diagnosed with plantar fasciitis and tarsal tunnel symptoms of this left  foot and ankle.  He has been treated conservatively with function orthotics,  10-20 BK compression hose, injection therapy, nonsteroidal anti-inflammatory  medication, reduction in activity, all of which have given him really  minimal relief.  Chris Lopez relates symptoms as thus:  When he gets up in the  morning, he has significant pain upon getting out of bed and ambulating.  Once it loosens up, it begins to feel a little better only to worsen as the  day goes on with an intense burning sensation in the medial ankle and going  through the heel up the lateral plantar aspect of the foot.  He relates that  the fourth and fifth toes feel as though they have gone numb.   PAST MEDICAL HISTORY:  Hypertension.   He is on BuSpar at this point, and he takes Flomax for his urological  condition.   He has an allergy to [facility] which causes a rash.  He did, however, tolerate  Celebrex and Diclofenac with no apparent problems.   OBJECTIVE:  MUSCULOSKELETAL:  He is focally tender to palpation of the  medial aspect of this left foot and heel.  He is noted radiographically to  have a small plantar calcaneal spur.  He does have a positive Tinel sign at  the medial ankle with pain radiating to the heel and the foot.  He has  decreased epicritic sensation along the  lateral plantar sole of this left  foot.   ASSESSMENT:  His symptoms are consistent with plantar fasciitis and tarsal  tunnel of the left medial ankle.   PLAN:  Surgical correction will consist of an endoscopic plantar fasciotomy  followed by a posterior tibial nerve decompression.  We will first to the  EPF under a separate draping with the use of a tourniquet.  Following the  completion of this procedure, he will be removed of the draping and of the  tourniquet and we will then proceed with the tarsal tunnel release.  Prior  to both procedures, he will be induced under general anesthesia and we will  couple that with lidocaine with epinephrine for hemostasis for the tarsal  tunnel.  I have discussed the procedure with Baldo Ash.  He has read his consent  form, apparently understood this, and signed.  We have discussed the  possibility of  complications including minimal to no improvement with the  burning pain and  numbness.  I think his heel pain will improve.  He also is described the  usual postoperative course.  He will need to be non-weightbearing on  crutches in a CAM walker.  [name], as stated, has participated in the  discussion and apparently has no further questions.                                            ______________________________  Denny Peon. Ulice Brilliant, D.P.M.     CMD/MEDQ  D:  12/19/2004  T:  12/19/2004  Job:  [id number]

## 2010-08-25 NOTE — H&P (Signed)
NAME:  Chris Lopez, ARMSTRONG                          ACCOUNT NO.:  [id number]   MEDICAL RECORD NO.:  [id number]                   PATIENT TYPE:  AMB   LOCATION:  DAY                                  FACILITY:  [facility]   PHYSICIAN:  J. Darreld Mclean, M.D.              DATE OF BIRTH:  25-May-1945   DATE OF ADMISSION:  DATE OF DISCHARGE:                                HISTORY & PHYSICAL   SUBJECTIVE:  My knee hurts.   HISTORY OF PRESENT ILLNESS:  The patient is a 65 year old male with pain and  tenderness in his right knee that he got injured on the job at work.  The  injury occurred around August 06, 2001 around 1:30 p.m.  He reports to [place] [facility] [place].  He was walking downstairs to the basement of a  building at 1247-D at [employer].  When he got on the last step, his foot  slipped and he jammed his knee.  When he came back down and hit the concrete  floor he had pain and tenderness.  He was eventually seen at [facility] in [place].  X-rays taken there on May 2.  He was seen on  subsequent visits on May 9 as well, and x-rays were taken.  He was first  seen in our office on July 16.  At that time there was concern he had a  meniscal injury.  It should be noted he has had two previous surgeries to  his knee on the right - the first was in March 1998 by Dr. Elana Alm. Wainer  in [place]; in August 2000 he had surgery in [place], [place].   MRI of his knee was done at [facility] which showed tear of the posterior  horn of the medial meniscus to the right knee, mild DJD, small joint  effusion.  It was requested from [employer], planned to have surgery on  the knee, it has been delayed until now, and they have agreed for him to  undergo arthroscopy of the right knee.  Risks and the problems have been  explained to the patient.  He is somewhat familiar with this, having  undergone two surgical procedures.   PAST MEDICAL HISTORY:  The patient has a history of heart  disease and denies  any other medical problems.   ALLERGIES:  FELDENE.   CURRENT MEDICATIONS:  1. BuSpar two a day.  2. Wellbutrin one a day.  3. Vicodin ES for pain.   SOCIAL HISTORY:  He smokes one pack of cigarettes per day and drinks  alcoholic beverages socially.  He has finished high school plus two years of  college.  Dr. Zenda Alpers is his family doctor.   PREVIOUS SURGERY:  Right knee surgery as stated above two times, and  thyroidectomy in 1993.   FAMILY HISTORY:  History of heart disease runs in his family and his  father  had prostate problems.   PHYSICAL EXAMINATION:  VITAL SIGNS:  Blood pressure 120/70, pulse 76,  respirations 16, afebrile.  Height 6 feet three-quarters inches, weight 237.  GENERAL:  The patient is alert, cooperative, and oriented.  HEENT:  Negative.  NECK:  Supple.  LUNGS:  Clear to P&A.  HEART:  Regular rhythm without murmur heard.  ABDOMEN:  Soft, nontender, without masses.  EXTREMITIES:  Right knee is tender, particularly in the medial joint line  with positive McMurray medially.  Range of motion is full.  There is no  crepitus.  Other extremities within normal limits.  CNS:  Intact.  SKIN:  Intact.   IMPRESSION:  Tear of the medial meniscus, right knee.   PLAN:  Operative arthroscopy, right knee.  Risks and problems of the  procedure have been discussed.  We have also discussed spinal anesthesia.  Labs are pending.  The patient appears to understand and agrees with the  procedure as outlined.                                               Chris Lopez, M.D.    JWK/MEDQ  D:  12/03/2001  T:  12/03/2001  Job:  [id number]

## 2010-08-25 NOTE — Discharge Summary (Signed)
NAME:  Chris Lopez, Chris Lopez                ACCOUNT NO.:  [id number]   MEDICAL RECORD NO.:  [id number]          PATIENT TYPE:  INP   LOCATION:  A332                          FACILITY:  [facility]   PHYSICIAN:  J. Darreld Mclean, M.D. DATE OF BIRTH:  29-Jan-1946   DATE OF ADMISSION:  03/25/2008  DATE OF DISCHARGE:  12/22/2009LH                               DISCHARGE SUMMARY   DISCHARGE DIAGNOSIS:  Severe degenerative joint disease of the right  knee.   PROCEDURE PERFORMED:  Total knee arthroplasty right knee.   DISCHARGE STATUS:  Improved.   PROGNOSIS:  Good.   DISPOSITION:  Home, to be seen and followed by Home Health.  The patient  is to use a Cryo/Cuff and is to be followed by [facility].  He is to have physical therapy through them.   DISCHARGE MEDICATIONS:  1. Enoxaparin 40 mg subcutaneous daily for 14 days.  2. Percocet 10/325 every four p.r.n. pain.  3. ProAir inhaler twice a day.  4. Effexor XR 150 mg daily.  5. Aspirin 325 mg daily.  6. Lipitor 40 mg daily.  7. Benazepril 20 mg daily.  8. Ambien 10 mg at night as needed.  9. Voltaren 75 twice a day after eating.  10.Diazepam 10 mg as needed.  11.NitroQuick patch as needed.   The patient was seen my office on January 5 for suture removal.  He is  to call me if any difficulty or any problem.  He has the numbers.   BRIEF HISTORY:  The patient was scheduled for an elective total knee  arthroplasty.  He underwent the above-mentioned procedure the day of  admission and tolerated it very well.  A CPM machine was placed and he  tolerated that well.  I saw him the night of surgery, he was doing well  and the pain was controlled.  I changed him to Dilaudid, as the morphine  did not seem to be as strong as he would like.  The first day after  surgery he was doing well, the pain was controlled, the CPM machine was  working well and he was seen by physical therapy.  He did well on his  first visit, he had previously had a  total knee on the left and was seen  and evaluated by the hospitalist during the stay per my request.  His  Hemovac was removed on the second postoperative day, he remained  afebrile.  His pain was well-controlled.  On the third day, he was doing  well, I discontinued his IV and his IV Dilaudid, I placed him on p.o.  medication.  His Foley was discontinued as well.  By the fourth  postoperative day, he was doing well, he was walking well and plans were  made for discharge.  I arranged home health.  Instructions were care and  use of the walker.  Labs otherwise were negative and normal.  If he has  any difficulty or any problem, he is to contact me through the office or  the hospital beeper system.  Holiday season is approaching and I  told  him that if he had any difficulty he could call through the hospital and  if I was not on-call that Dr. Romeo Apple would see him.  The patient  understood.                                            ______________________________  Shela Commons. Darreld Mclean, M.D.     JWK/MEDQ  D:  05/06/2008  T:  05/06/2008  Job:  [id number]

## 2010-08-25 NOTE — Op Note (Signed)
NAME:  TREYTEN, MONESTIME                ACCOUNT NO.:  [id number]   MEDICAL RECORD NO.:  [id number]          PATIENT TYPE:  AMB   LOCATION:  DAY                           FACILITY:  [facility]   PHYSICIAN:  J. Darreld Mclean, M.D. DATE OF BIRTH:  02-Apr-1946   DATE OF PROCEDURE:  10/02/2005  DATE OF DISCHARGE:                                 OPERATIVE REPORT   PREOPERATIVE DIAGNOSIS:  Severe degenerative joint disease of the left knee.   POSTOPERATIVE DIAGNOSES:  Severe degenerative joint disease of the left  knee.   PROCEDURE:  Total knee arthroplasty on the left using an [employer]  system with a #11 left femoral component, a #11 tibial component, a #10  tibial wafer, a #9 tibial button with methylmethacrylate cemented in place.   ANESTHESIA:  Spinal.   SURGEON:  J. Darreld Mclean, M.D.   TOURNIQUET TIME:  1 hour and 16 minutes.   DRAINS:  One large Hemovac drain used.   INDICATIONS:  The patient is a 65 year old male with pain and tenderness  left knee.  He has undergone two arthroscopies.  He has continued to have  pain and tenderness in his knee.  He was seen in [place] by another  orthopedist and felt to be a candidate for total knee arthroplasty.  We have  tried conservative treatment.  He has tried Synvisc, tried other means of  treatment and still has continued pain and tenderness.  Total knee  arthroplasty was entered and agreed to by the other surgeon, as well, in a  second opinion.  The patient understands the risk and imponderables of the  procedure.  I have discussed with him on multiple occasions including  infection, embolization, loosening of the knee, repeat surgery  possibilities, need for physical therapy, autotransfusion of blood,  anesthesia risk.  Elected to give autotransfusion of blood.  He understands  risk with transfusions.  He was given antibiotic, Vancomycin, preoperatively  and understands about infection and how this could be a very serious  problem.  He understands what pulmonary embolization is and this could lead  to death.  He has attended the total joint class and found that very  instructive.  He appears to understand and agrees to the procedure as  outlined.   DESCRIPTION OF PROCEDURE:  The patient was seen in the holding area.  He  identified the left knee as the correct surgical site.  He placed a mark on  the left knee.  I placed a mark on the left knee.  He was brought to the  operating room and given spinal anesthesia.  He was placed supine on the  operating room table.  Tourniquet placed deflated on left upper thigh.  He  was prepped and draped in the usual manner.  Leg was elevated, wrapped with  an Esmarch bandage, tourniquet inflated to 350 mmHg.  We had a time out,  reidentifying the patient as Mr. Carreker and doing the left knee as a total  knee arthroplasty.  Midline incision was made.  Medial parapatellar incision  was made.  The patella was everted.  The knee had significant degenerative  joint disease present, mostly medially.  The patient had first drill hole  made and a first gig was placed.  I cut it at a 0 mark.  Gig was removed and  first cuts were made.  We did a sizing with the third gig and it measured a  size 11.  Size 11 block was placed and cuts were made anterior to posterior,  posterior to chamfer, and anterior to chamfer.  Attention directed to the  tibia.  Tibia was brought forward and held in place with so-called pickle  fork.  Excess meniscus and anterior and posterior cruciate were removed.  Then, made a mark from 8 mm from the lowest side which was on the medial  aspect.  Cut was made.  Attention was then directed back to the femur.  So-  called box cuts were made.  After these were made, a trial was carried out  of the tibial component, a size 11 fitting the best and a trial reduction of  the femur which fit excellently.  Size #10 wafer fit the best.  Dawna Part were  made on the tibia.   Tibial prosthesis then had its components placed  properly with the so-called arrowheads.  Attention was then directed to the  patella.  Patella measured approximately 26 to 27 mm; 10 mm were removed.  We did another check and it was 16 mm.  A size  #9 button was fit the best.  Drill holes were made for this.  The knee was  irrigated with Water-Pik lavage systems.  Then, I mixed the cement under  vacuum in a viscous stage was placed in a caulk gun.  Caulk gun was used and  the cement was placed.  First, the tibial component was placed and put in  position, excess cement removal.  Femoral component was placed, excess  cement removed.  Patella button was placed, excess cement removed.  Then,  the tibial wafer was placed.  Once the glue had hardened, Hemovac drain was  placed along with 2-0 silk.  Fascial layer was reapproximated using  interrupted #1 Surgilon suture.  Tourniquet was deflated after 1 hour and 16  minutes.  X-rays were taken in [facility] and lateral view which looked very good.  Subcutaneous tissues were approximated using 2-0 plain and then the skin  reapproximated with skin staples.  Sterile dressing applied.  Bulky dressing  applied.  Will be placed in a cryo cuff and then placed into a CPM machine.  Tolerated the procedure well.           ______________________________  J. Darreld Mclean, M.D.     JWK/MEDQ  D:  10/02/2005  T:  10/02/2005  Job:  [id number]

## 2010-08-25 NOTE — H&P (Signed)
NAME:  Chris Lopez, Chris Lopez                ACCOUNT NO.:  [id number]   MEDICAL RECORD NO.:  [id number]          PATIENT TYPE:  AMB   LOCATION:  DAY                           FACILITY:  [facility]   PHYSICIAN:  J. Darreld Mclean, M.D. DATE OF BIRTH:  03/16/1946   DATE OF ADMISSION:  10/02/2005  DATE OF DISCHARGE:  LH                                HISTORY & PHYSICAL   CHIEF COMPLAINT:  Left knee pain.   HISTORY:  The patient is a 65 year old male with significant pain and  tenderness in his left knee. He has had an arthroscopy of the left knee on  March 12, 2006, underwent a medial and partial meniscectomy and a partial  lateral meniscectomy using a laser and had a repeat arthroscopy on May 10, 2005 with removal of loose fragments medially. Also had removal of small  remnant of the medial meniscus. Continued hip pain and tenderness in his  knee. He has had Synvisc injections. He has had cortisone injection. He has  had rest. He has had exercise, had physical therapy, and anti-  inflammatories. Still has continued pain and tenderness in his knee. I had  him seen as a second opinion in [place] by Dr. Sherlean Foot, and he concurred  that the patient had continued pain and tenderness and degenerative joint  disease and will need total knee arthroplasty if the pain continued. He  recommended further physical therapy which was done, but the patient still  had pain and tenderness, and he elected to have a total knee arthroplasty. I  have discussed with him the risks and imponderables of the procedure  including infection, pulmonary embolization leading to death, blood  transfusion and consideration of autotransfusion of blood, anesthesia risks,  postoperative physical therapy. I told him about loosening that could occur  over time and about some restrictions on activity he will have. He appeared  to understand. He elected to have autotransfusion of blood. He has obtained  this through the [facility]. He  has been to the Total Joint Class offered by  the hospital and found this to be informative and helpful. He also  reiterated the risks and imponderables that I previously discussed with him.  He wants to proceed with total knee arthroplasty. He says he is mentally  prepared for the procedure.   ALLERGIES:  THE PATIENT IS ALLERGIC TO FELDENE.   PAST HISTORY:  The patient has a history of heart disease. He smokes a pack  a day. Does not use alcohol. 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 Dr.  Morrie Sheldon is his family doctor. He lives in [place].   PREVIOUS SURGERY:  Right foot and heel. Right knee arthroscopy by me 2003,  left knee arthroscopy 2006 in November and again in February of this year.  Right knee surgery while in [place], right knee surgery in 1998. Thyroidectomy  in 1992. Left knee surgery 1993 while living in [place].   FAMILY HISTORY:  The patient states prostate cancer runs in his family.   PHYSICAL EXAMINATION:  VITAL SIGNS:  Within normal limits.  HEENT:  Negative.  Alert, cooperative, oriented.  NECK:  Supple.  LUNGS:  Clear to P&A.  HEART:  Regular without murmur heard.  ABDOMEN:  Soft, nontender without masses.  EXTREMITIES:  Left knee with effusion, tenderness, crepitus. He has pain and  tenderness more medially through the knee, and he points ____________  medially where it is very painful. Knee is very painful. Knee is stable.  Other extremities within normal limits.  CENTRAL NERVOUS SYSTEM:  Intact.  SKIN:  Intact.   IMPRESSION:  Significant degenerative joint disease, left knee, status post  arthroscopy x2 within the last year on the left knee. For total knee  arthroplasty.   We discussed with the patient the planned procedure, risks and imponderables  as stated. It appears to understand and agrees to the procedure as outlined.  I will obtain the hospitalist to see him after surgery, arrange for physical  therapy, and will arrange for home health postoperatively.  Will placed on  enoxaparin, and he has blood transfusion given by himself through the [facility].                                            ______________________________  Shela Commons. Darreld Mclean, M.D.     JWK/MEDQ  D:  10/02/2005  T:  10/02/2005  Job:  [id number]

## 2010-08-25 NOTE — Op Note (Signed)
NAME:  Chris Lopez, Chris Lopez                ACCOUNT NO.:  [id number]   MEDICAL RECORD NO.:  [id number]          PATIENT TYPE:  AMB   LOCATION:  DAY                           FACILITY:  [facility]   PHYSICIAN:  J. Darreld Mclean, M.D. DATE OF BIRTH:  1945-08-22   DATE OF PROCEDURE:  05/10/2005  DATE OF DISCHARGE:                                 OPERATIVE REPORT   PREOPERATIVE DIAGNOSES:  1.  Status post arthroscopy of the left knee with questionable retained      fragment.  2.  New meniscal tear.   POSTOPERATIVE DIAGNOSES:  Small retained fragment new meniscal tear, left  knee.   PROCEDURE:  1.  Arthroscopy of the knee.  2.  Removal of loose fragment.  3.  Partial medial meniscectomy, almost subtotal.   ANESTHESIA:  Spinal.   TOURNIQUET TIME:  37 minutes.   SURGEON:  J. Darreld Mclean, M.D.   INDICATIONS:  Patient had  arthroscopy of the knee done December 4. He  initially did well, then started having increased pain and tenderness to his  knee medially. He did not respond well to therapy or injection.  He had  another MRI done that showed a possible new meniscal tear.  Because he had  not gotten any better, I recommended repeat surgery.  The risks and  imponderables were discussed preoperatively.   DESCRIPTION OF PROCEDURE:  The patient was seen in the holding area.  The  left knee was identified as the correct surgical site.  He placed a mark, I  placed a mark on the left knee.  He was taken back to the operating room. He  was given spinal anesthesia, placed supine on the operating room table, leg  holder placed, deflated left upper thigh, tourniquet applied.  The patient  was prepped and draped in the usual manner.   We identified the patient as doing a left knee as the correct surgical site,  and doing a time out.  Then the leg was elevated and wrapped  circumferentially with an Esmarch bandage, tourniquet inflated to 300 mmHg.  Arthroscope was inserted laterally after medial inflow  cannula had been  inserted with lactated Ringers through an infusion pump.   Findings of the knee:  Suprapatellar pouch had some mild inflammation. There  were some grade 2 changes of the patellofemoral joint.  Medially there was a  small retained fragment floating in the knee, I removed. There was some  excess synovium anteriorly; I removed that.  There was a small tear in the  posterior horn with a small remnant that was left.  The rest of the knee  looked good; and there was grade 2-3 changes on the articular surface  particularly of the femur. Anterior cruciate was intact.  Laterally, the  menisci looked normal, just minor grade 2 changes; no loose fragments over  there.   DESCRIPTION OF PROCEDURE:  Attention was directed to the medial side, again,  there was a small fragment and removed that. There was significant synovium,  anteriorly, and we removed that.  The meniscus seemed to be loose around the  anterior margin and I cut the meniscus anteriorly to the portion that had  previously been removed posteriorly.  Posteriorly there was a very small  meniscal tear with a small remnant that remained and I removed that.  Basically, I did sort of a subtotal meniscectomy. I spent some time with  this. I used the laser, the meniscal shaver. When I finished there was no  evidence of any loose fragments.  There was very little meniscus, at all,  left. The knee tracked well.  I looked around for any other retained  fragments.   The wound was irrigated with the remaining part of lactated Ringers.  The  wound was reapproximated using 3-0 Nylon in an interrupted vertical mattress  manner.  Marcaine 0.25% was instilled into each portal. Tourniquet was  deflated after 37 minutes.  The patient tolerated the procedure well and  went to recovery in good condition.  Prescription for Vicodin ES given for  pain.  I will see him in the office in 10 days to 2 weeks.  Physical therapy  has been arranged.   If any difficulties, contact me through the office,  hospital beeper system.           ______________________________  Shela Commons. Darreld Mclean, M.D.     JWK/MEDQ  D:  05/10/2005  T:  05/10/2005  Job:  [id number]

## 2010-08-25 NOTE — Consult Note (Signed)
NAME:  Chris Lopez, ENRIQUES                ACCOUNT NO.:  [id number]   MEDICAL RECORD NO.:  [id number]          PATIENT TYPE:  INP   LOCATION:  A322                          FACILITY:  [facility]   PHYSICIAN:  Hanley Hays. Dechurch, M.D.DATE OF BIRTH:  1945-04-26   DATE OF CONSULTATION:  10/02/2005  DATE OF DISCHARGE:                                   CONSULTATION   REASON FOR CONSULTATION:  History of coronary artery disease and  perioperative management.   HISTORY OF THE PRESENT ILLNESS:  The patient is a 65 year old gentleman who  underwent a total left knee replacement by Dr. Hilda Lias and who has a past  medical history remarkable for coronary artery disease diagnosed by  catheterization, though apparently was not obstructive secondary to  progressive deterioration and degenerative joint disease.  The patient is  actually seen postoperatively post recovery.   PAST MEDICAL AND SURGICAL HISTORY:  The past medical history is remarkable  for:  1.  Coronary artery disease.  He is status post cardiac catheterization      times two.  Apparently he has no obstructive disease and is only on      aspirin.  He has no history of hypertension or other risk factors.  He      does smoke about a pack per day.  2.  History of thyroidectomy secondary to goiter.  3.  History of multiple arthroscopies of the bilateral knees.  4.  History of metatarsal surgery in September 2006.   Otherwise his history is unremarkable.  The patient did undergo an EGD and  total colonoscopy about a year ago as part of an employee physical.   FAMILY HISTORY:  The patient's family history is pertinent for prostate  cancer in his father, severe coronary artery disease in his brothers with  one is dead status post six stents and another died at age 7 of sudden  cardiac death.   SOCIAL HISTORY:  The patient has been married times two.  He currently  resides in [place].  He works as an Acupuncturist and travels quite  frequently.  He has a total of five children.  No alcohol abuse.  Tobacco  abuse as noted above.  No history of drug abuse.   MEDICATIONS:  The patient's current medications include:  1.  Valium 5 mg as needed for anxiety.  2.  Vicodin 5/500 q.4 hours p.r.n. pain.  3.  Voltaren.  4.  Ambien.  5.  Aspirin 81 mg daily, which has been on hold.   ALLERGIES:  ALLERGIES INCLUDE FELDENE.   REVIEW OF SYSTEMS:  The patient has been less active secondary to his  degenerative joint disease and has gained probably 10-15 pounds over the  last year.  No history of shortness of breath, chest pain, orthopnea, etc.  He has had, what he calls diarrhea that he thinks was related to voltaren;  where he will have normal bowel movements for three or four days, and then a  day with several loose bowel movements.  He has increased stooling when he  becomes anxious.  I suspect he  has a degree of irritable bowel syndrome.  He  describes himself as high strung.  He has had poor sleep over the last  several months secondary to knee pain.   PHYSICAL EXAMINATION:  GENERAL APPEARANCE:  The physical exam is limited as  the patient is in a CPM, but he is alert and appropriate, and in no  distress.  VITAL SIGNS:  Blood pressure is 114/67,  temperature 97.5, pulse is 60 and  regular, respirations are unlabored, and O2 saturation is documented as 94%  on room air.  HEENT:  Oropharynx is moist.  Teeth are in good repair.  LUNGS:  The patient's lungs are clear to auscultation anteriorly and  posteriorly.  HEART:  From what I can examine his heart is regular with no murmur or  gallop.  ABDOMEN:  The and is protuberant, soft and nontender with bowel sounds.  EXTREMITIES:  The left lower extremity is currently in a CPM.  His feet are  warm.  The right lower extremity is intact.  No edema. Good pulses.  NEUROLOGIC:  The neurological exam is intact.  SKIN:  The patient has no skin rashes, lesions or breakdown of  significance.   ASSESSMENT AND PLAN:  1.  Status post left total knee replacement.  In the immediate postoperative course he is very stable.   The patient is on prophylactic Lovenox, which, of course, will be continued  as per orthopedic's recommendation.   1.  Coronary artery disease, nonobstructive, stable.  Lipid status is unknown.  He is followed by Dr. Morrie Sheldon in [place].   1.  History of thyroidectomy.  The patient relates his thyroid functions are monitored and have been  stable.  He is not on replacement.   1.  Tobacco abuse.   The patient has opted to use this to quit smoking and has nicotine  replacement in place.  He was recommended to contact the smoking cessation  group should he need further assistance.   1.  Probable irritable bowel syndrome.   Manage expectantly with fiber; and, would not be surprised to see  constipation during this hospital stay,   1.  Anxiety.   The patient had previously been on BuSpar, but is not taking Valium as need.  Again, we will defer this to his primary care physician.   Thank you for the opportunity to see this very nice person.  I will follow  him with you in the postoperative period.      Hanley Hays Josefine Class, M.D.  Electronically Signed     FED/MEDQ  D:  10/03/2005  T:  10/04/2005  Job:  [id number]

## 2010-08-25 NOTE — Op Note (Signed)
NAME:  Chris Lopez, Chris Lopez                ACCOUNT NO.:  [id number]   MEDICAL RECORD NO.:  [id number]           PATIENT TYPE:  AMB   LOCATION:  DAY                           FACILITY:  [facility]   PHYSICIAN:  J. Darreld Mclean, M.D. DATE OF BIRTH:  1945-05-04   DATE OF PROCEDURE:  03/12/2005  DATE OF DISCHARGE:                                 OPERATIVE REPORT   PREOPERATIVE DIAGNOSIS:  Tear left knee medial and lateral meniscus.   POSTOPERATIVE DIAGNOSIS:  Tear left knee medial and lateral meniscus.   PROCEDURE:  Operative arthroscopy of the left knee with partial medial and  lateral meniscectomy using the laser.   ANESTHESIA:  Spinal.   SURGEON:  J. Darreld Mclean, M.D.   TOURNIQUET TIME:  24 minutes, no drains.   INDICATIONS:  The patient is a 65 year old male with pain and tenderness in  his left knee giving way.  MRI shows a tear in the medial and lateral  meniscus left knee.  He has not improved with conservative treatment and  surgery is recommended.  He has undergone a previous arthroscopy on the  other knee and understands the procedure, risk and imponderables. He asked  appropriate questions preoperatively.   DESCRIPTION OF PROCEDURE:  The patient was seen in the holding area.  The  left knee was identified as the correct surgical site.  He placed a marked  on the left knee, I placed a mark on the left knee.  He was brought back to  the operating room.  He was given spinal anesthesia.  He was placed supine  on the operating room table.  The leg holder was placed, deflated left upper  thigh along with the tourniquet.  The patient was prepped and draped in the  usual manner.  We took a time out, identifying the patient and the left knee  as an arthroscopy procedure.  The leg was elevated and wrapped  circumferentially with an esmarch bandage.  The tourniquet was inflated to  300 mmHg.  An inflow cannula inserted and lactated Ringers instilled in the  knee by an infusion pump.  An  arthroscope was inserted laterally and the  knee systematically examined.   FINDINGS:  The suprapatellar pouch looked normal and the surface of the  patella showed some grade 2 changes.  Medially there was a tear of the  posterior horn of the medial meniscus with grade 2 to grade 3 changes on the  articular surface, no loose bodies.  The anterior cruciate was intact.  Laterally, there was a tear on the midportion of the posterior horn, no  loose bodies.  The articular surface looked a little better on the lateral  side, a grade 2.   Using a meniscal shave, a punch and a laser, the meniscus was removed  medially and then laterally.  A smooth contour was obtained.  Permanent  pictures were taken throughout the procedure.  Once both sides were done,  the knee was systematically reexamined and no pathology was found and no  loose bodies.  The knee was irrigated with the remaining part of  lactated  Ringers.  The wound was reapproximated using 3-0 nylon in an interrupted  vertical mattress manner.  Marcaine 0.25% instilled in each portal.  The  tourniquet was deflated and it was 24 minutes.  Sterile dressing applied and bulky dressing applied.  The patient tolerated  the procedure well and will go to recovery in good condition.  A  prescription for Vicodin ES given for pain.  Physical therapy has been  arranged.  If he has any difficulties he is to contact me through the  office, hospital beeper system.           ______________________________  Shela Commons. Darreld Mclean, M.D.     JWK/MEDQ  D:  03/12/2005  T:  03/12/2005  Job:  [id number]

## 2010-08-25 NOTE — Op Note (Signed)
NAME:  Chris Lopez, Chris Lopez                          ACCOUNT NO.:  [id number]   MEDICAL RECORD NO.:  [id number]                   PATIENT TYPE:  AMB   LOCATION:  DAY                                  FACILITY:  [facility]   PHYSICIAN:  J. Darreld Mclean, M.D.              DATE OF BIRTH:  13-Jun-1945   DATE OF PROCEDURE:  DATE OF DISCHARGE:                                 OPERATIVE REPORT   PREOPERATIVE DIAGNOSIS:  Tear medial meniscus right knee.   POSTOPERATIVE DIAGNOSIS:  Tear medial meniscus right knee.   PROCEDURE:  Right knee arthroscopy, partial medial meniscectomy.   SURGEON:  J. Darreld Mclean, M.D.   ASSISTANT:  Candace Cruise, [facility]   TOURNIQUET TIME:  26 minutes.   DISPOSITION:  To recovery room in good condition   INDICATIONS:  The patient is a 65 year old male who injured his right knee  at work in April.  He has been having continued pain and tenderness since  then.  MRI of the knee shows tear of the posterior horn, medial meniscus.   The patient has had two previous surgeries on his knee, but this is a new  injury, new problem, new pain.  His history and findings are consistent with  a new tear of the medial meniscus.  Surgery is indicated as stated and risks  and imponderables of the procedure have been discussed with the patient.  He  appeared to understand and agreed to the procedure as outlined.   FINDINGS:  The suprapatellar pouch had a large plica, but otherwise  negative.  The medial gutter was intact.  The articular surfaces had grade 2  changes medially with perhaps an early grade 3 at the tibial plateau.  There  is a tear in the most posterior portion towards the midline of the medial  meniscus.  He had previous evidence of medial meniscectomy on the anterior  to posterior horn.  Anterior cruciate was intact.  Laterally the meniscus  was intact with much fraying.  Articular surfaces had grade 2 changes.  Lateral gutter was negative.  There were no loose bodies.   DESCRIPTION OF PROCEDURE:  The patient was given spinal anesthesia and  placed supine on the operating room table.  Tourniquet and leg holder  placed, deflated right upper thigh.  The patient was prepped and draped in  the usual manner.  The leg was elevated and wrapped circumferentially with  an Esmarch bandage, tourniquet inflated to 300 mmHg.  Prior to making any  incision we re-verified that we were doing Mr. Sida and it was his right  knee for a suspected right medial meniscectomy.   At this point, after verification was re-established, inflow cannula  inserted medially and lactated Ringers instilled by infusion pump.  Arthroscope inserted laterally and knee systematically examined.  Please see  findings above.  Pertinent pictures were taken.  When we first put the scope  in  there was a problem with the camera, one of the settings had been  unobtained and this was quickly corrected.  We could see very well.   Attention was directed to the medial side and given as a previous  meniscectomy anterior to the most medial portion, but in the posterior horn,  and in the far posterior horn more towards the midline there was a definite  definitive tear and this was removed using a meniscal punch and a meniscal  shaver.  A good smooth contour was obtained.  The knee was systematically re-  examined and no new pathology found.  Had a large plica in the pouch and  this was removed with the meniscal shaver.   The knee was systematically re-examined.  The wound was irrigated with  lactated Ringers.  The portals were then closed using 3-0 Nylon in an  interrupted vertical mattress manner.  Marcaine 0.25% was instilled into  each portal, 30 cc was used.  Tourniquet deflated after 26 minutes.  Sterile  dressing applied.  Bulky dressing applied.  Knee immobilizer applied.  Patient given a prescription for Tylox for pain and will be seen in the  office in approximately 10 days to 2 weeks.  Physical  therapy has been  arranged.  If any difficulty he is to contact me through the office or  through the hospital beeper system.  Numbers have been provided.                                               [name], M.D.    JWK/MEDQ  D:  12/04/2001  T:  12/06/2001  Job:  [id number]

## 2010-08-25 NOTE — Discharge Summary (Signed)
NAME:  Chris Lopez, Chris Lopez                ACCOUNT NO.:  [id number]   MEDICAL RECORD NO.:  [id number]          PATIENT TYPE:  INP   LOCATION:  A322                          FACILITY:  [facility]   PHYSICIAN:  J. Darreld Mclean, M.D. DATE OF BIRTH:  1945-12-13   DATE OF ADMISSION:  10/02/2005  DATE OF DISCHARGE:  06/30/2007LH                                 DISCHARGE SUMMARY   DISCHARGE DIAGNOSIS:  Degenerative joint disease in the knee on the left.   PROCEDURE PERFORMED:  Total knee arthroplasty on the left.   DISCHARGE STATUS:  Improved.   DISPOSITION:  Home.   The patient had an [employer] system, a #11 left femoral component, a  #11 left tibial component, a #10 tibial wafer, and a #9 tibial button with  methylmethacrylate.   DISCHARGE MEDICATIONS:  1.  Percocet 7.5/325, one every 4 hours p.r.n. pain.  2.  Enoxaparin 40 mg subcutaneously to be given by home health daily.   FOLLOW UP:  She will be seen in my office on October 16, 2005, for suture  removal.  Instructions were given for care, home health is going to arrange.   BRIEF HISTORY:  The patient was admitted on the date of the procedure,  underwent the procedure, and tolerated it well.  Postoperatively, he was  given a nicotine patch and has since stopped smoking which is a wonderful  thing.  On the first day, he was afebrile, pain was controlled, CPM was  adjusted.  CryoCuff was adjusted.  He began physical therapy.  He was seen  by PT and also by the hospitalist because he does not have a local family  physician.  On the second postoperative day, his temperature maximum was  100, Hemovac was removed, hemoglobin was 12.1.  His pain was controlled with  PCA.  The Foley was discontinued and he began walking approximately 15 feet  that day with motion from 15 to 75.  On October 05, 2005, he was doing well,  the pain was well-controlled.  I discontinued his intravenous morphine and  his CPM.  I recommended increasing physical therapy  and home health was  ordered.  On October 05, 2005, he was afebrile, the lungs looked good, he was  having excellent motion, he was doing well with physical therapy, and he was  discharged home.  Home health was arranged.  The patient's labs basically  stayed within normal limits.  EKG was negative.  Chest x-ray negative.  He  will be seen in the office as stated.  If he has any difficulties, he can  contact me through the office/hospital beeper system.                                           ______________________________  Shela Commons. Darreld Mclean, M.D.    JWK/MEDQ  D:  10/23/2005  T:  10/23/2005  Job:  [id number]

## 2011-01-11 LAB — DIFFERENTIAL
Basophils Absolute: 0 10*3/uL (ref 0.0–0.1)
Basophils Absolute: 0 10*3/uL (ref 0.0–0.1)
Basophils Relative: 0 % (ref 0–1)
Basophils Relative: 0 % (ref 0–1)
Basophils Relative: 0 % (ref 0–1)
Blasts: 0 %
Eosinophils Absolute: 0 10*3/uL (ref 0.0–0.7)
Eosinophils Absolute: 0.1 10*3/uL (ref 0.0–0.7)
Eosinophils Absolute: 0.3 10*3/uL (ref 0.0–0.7)
Eosinophils Relative: 0 % (ref 0–5)
Eosinophils Relative: 1 % (ref 0–5)
Eosinophils Relative: 1 % (ref 0–5)
Eosinophils Relative: 1 % (ref 0–5)
Eosinophils Relative: 3 % (ref 0–5)
Lymphocytes Relative: 10 % — ABNORMAL LOW (ref 12–46)
Lymphocytes Relative: 17 % (ref 12–46)
Lymphocytes Relative: 8 % — ABNORMAL LOW (ref 12–46)
Lymphs Abs: 1.1 10*3/uL (ref 0.7–4.0)
Lymphs Abs: 1.9 10*3/uL (ref 0.7–4.0)
Metamyelocytes Relative: 0 %
Metamyelocytes Relative: 0 %
Monocytes Absolute: 0.6 10*3/uL (ref 0.1–1.0)
Monocytes Absolute: 0.8 10*3/uL (ref 0.1–1.0)
Monocytes Absolute: 0.9 10*3/uL (ref 0.1–1.0)
Monocytes Relative: 10 % (ref 3–12)
Monocytes Relative: 6 % (ref 3–12)
Monocytes Relative: 7 % (ref 3–12)
Myelocytes: 0 %
Neutro Abs: 7.3 10*3/uL (ref 1.7–7.7)
Neutro Abs: 8.1 10*3/uL — ABNORMAL HIGH (ref 1.7–7.7)
Neutro Abs: 8.3 10*3/uL — ABNORMAL HIGH (ref 1.7–7.7)
Neutrophils Relative %: 78 % — ABNORMAL HIGH (ref 43–77)
nRBC: 0 /100 WBC
nRBC: 0 /100 WBC

## 2011-01-11 LAB — CROSSMATCH
ABO/RH(D): O POS
Antibody Screen: NEGATIVE
Antibody Screen: NEGATIVE

## 2011-01-11 LAB — BASIC METABOLIC PANEL
BUN: 10 mg/dL (ref 6–23)
BUN: 7 mg/dL (ref 6–23)
BUN: 9 mg/dL (ref 6–23)
CO2: 29 mEq/L (ref 19–32)
Calcium: 8.5 mg/dL (ref 8.4–10.5)
Calcium: 8.6 mg/dL (ref 8.4–10.5)
Chloride: 101 mEq/L (ref 96–112)
Chloride: 101 mEq/L (ref 96–112)
Creatinine, Ser: 0.92 mg/dL (ref 0.4–1.5)
GFR calc Af Amer: >60 mL/min (ref >60)
GFR calc Af Amer: >60 mL/min (ref >60)
GFR calc Af Amer: >60 mL/min (ref >60)
GFR calc non Af Amer: >60 mL/min (ref >60)
GFR calc non Af Amer: >60 mL/min (ref >60)
GFR calc non Af Amer: >60 mL/min (ref >60)
GFR calc non Af Amer: >60 mL/min (ref >60)
Glucose, Bld: 138 mg/dL — ABNORMAL HIGH (ref 70–99)
Potassium: 3.5 mEq/L (ref 3.5–5.1)
Potassium: 3.6 mEq/L (ref 3.5–5.1)
Potassium: 3.8 mEq/L (ref 3.5–5.1)
Sodium: 132 mEq/L — ABNORMAL LOW (ref 135–145)
Sodium: 138 mEq/L (ref 135–145)
Sodium: 139 mEq/L (ref 135–145)

## 2011-01-11 LAB — CBC
HCT: 34.5 % — ABNORMAL LOW (ref 39.0–52.0)
HCT: 34.7 % — ABNORMAL LOW (ref 39.0–52.0)
HCT: 37.1 % — ABNORMAL LOW (ref 39.0–52.0)
HCT: 39.3 % (ref 39.0–52.0)
Hemoglobin: 12.5 g/dL — ABNORMAL LOW (ref 13.0–17.0)
Hemoglobin: 13.1 g/dL (ref 13.0–17.0)
Hemoglobin: 14.8 g/dL (ref 13.0–17.0)
MCHC: 33.6 g/dL (ref 30.0–36.0)
MCHC: 33.7 g/dL (ref 30.0–36.0)
MCV: 88.7 fL (ref 78.0–100.0)
MCV: 88.9 fL (ref 78.0–100.0)
MCV: 89.5 fL (ref 78.0–100.0)
Platelets: 123 10*3/uL — ABNORMAL LOW (ref 150–400)
Platelets: 127 10*3/uL — ABNORMAL LOW (ref 150–400)
Platelets: 148 10*3/uL — ABNORMAL LOW (ref 150–400)
Platelets: 180 10*3/uL (ref 150–400)
Platelets: 185 10*3/uL (ref 150–400)
RBC: 3.89 MIL/uL — ABNORMAL LOW (ref 4.22–5.81)
RBC: 4.15 MIL/uL — ABNORMAL LOW (ref 4.22–5.81)
RBC: 4.95 MIL/uL (ref 4.22–5.81)
RDW: 14.4 % (ref 11.5–15.5)
RDW: 14.4 % (ref 11.5–15.5)
WBC: 10 10*3/uL (ref 4.0–10.5)
WBC: 11.2 10*3/uL — ABNORMAL HIGH (ref 4.0–10.5)
WBC: 13.3 10*3/uL — ABNORMAL HIGH (ref 4.0–10.5)
WBC: 7.9 10*3/uL (ref 4.0–10.5)

## 2011-01-11 LAB — BLOOD GAS, ARTERIAL
Acid-Base Excess: 1.9 mmol/L (ref 0.0–2.0)
Patient temperature: 37
TCO2: 22.3 mmol/L (ref 0–100)
pCO2 arterial: 33.6 mmHg — ABNORMAL LOW (ref 35.0–45.0)
pH, Arterial: 7.487 — ABNORMAL HIGH (ref 7.350–7.450)

## 2011-01-11 LAB — PROTIME-INR
INR: 1 (ref 0.00–1.49)
Prothrombin Time: 13.1 seconds (ref 11.6–15.2)

## 2011-01-11 LAB — COMPREHENSIVE METABOLIC PANEL
Alkaline Phosphatase: 81 U/L (ref 39–117)
BUN: 12 mg/dL (ref 6–23)
Creatinine, Ser: 0.99 mg/dL (ref 0.4–1.5)
Glucose, Bld: 90 mg/dL (ref 70–99)
Potassium: 4.2 mEq/L (ref 3.5–5.1)
Total Protein: 6.2 g/dL (ref 6.0–8.3)

## 2011-01-11 LAB — URINALYSIS, ROUTINE W REFLEX MICROSCOPIC
Glucose, UA: NEGATIVE mg/dL
pH: 5.5 (ref 5.0–8.0)

## 2011-01-11 LAB — LIPID PANEL
LDL Cholesterol: 124 mg/dL — ABNORMAL HIGH (ref 0–99)
Total CHOL/HDL Ratio: 5 RATIO
VLDL: 16 mg/dL (ref 0–40)

## 2011-01-11 LAB — HEPARIN ANTIBODY SCREEN: Heparin Antibody Screen: NEGATIVE

## 2011-01-11 LAB — APTT: aPTT: 29 seconds (ref 24–37)

## 2012-03-15 ENCOUNTER — Emergency Department (HOSPITAL_COMMUNITY): Payer: Medicare Other

## 2012-03-15 ENCOUNTER — Emergency Department (HOSPITAL_COMMUNITY)
Admission: EM | Admit: 2012-03-15 | Discharge: 2012-03-15 | Disposition: A | Discharge status: Home / Self Care | Payer: Medicare Other | Attending: Emergency Medicine | Admitting: Emergency Medicine

## 2012-03-15 ENCOUNTER — Encounter (HOSPITAL_COMMUNITY): Payer: Self-pay | Admitting: Emergency Medicine

## 2012-03-15 DIAGNOSIS — Y929 Unspecified place or not applicable: Secondary | ICD-10-CM | POA: Insufficient documentation

## 2012-03-15 DIAGNOSIS — E119 Type 2 diabetes mellitus without complications: Secondary | ICD-10-CM | POA: Insufficient documentation

## 2012-03-15 DIAGNOSIS — Z9889 Other specified postprocedural states: Secondary | ICD-10-CM | POA: Insufficient documentation

## 2012-03-15 DIAGNOSIS — J449 Chronic obstructive pulmonary disease, unspecified: Secondary | ICD-10-CM | POA: Insufficient documentation

## 2012-03-15 DIAGNOSIS — S6390XA Sprain of unspecified part of unspecified wrist and hand, initial encounter: Secondary | ICD-10-CM | POA: Insufficient documentation

## 2012-03-15 DIAGNOSIS — Z79899 Other long term (current) drug therapy: Secondary | ICD-10-CM | POA: Insufficient documentation

## 2012-03-15 DIAGNOSIS — F411 Generalized anxiety disorder: Secondary | ICD-10-CM | POA: Insufficient documentation

## 2012-03-15 DIAGNOSIS — W19XXXA Unspecified fall, initial encounter: Secondary | ICD-10-CM | POA: Insufficient documentation

## 2012-03-15 DIAGNOSIS — F3289 Other specified depressive episodes: Secondary | ICD-10-CM | POA: Insufficient documentation

## 2012-03-15 DIAGNOSIS — Y939 Activity, unspecified: Secondary | ICD-10-CM | POA: Insufficient documentation

## 2012-03-15 DIAGNOSIS — I10 Essential (primary) hypertension: Secondary | ICD-10-CM | POA: Insufficient documentation

## 2012-03-15 DIAGNOSIS — N400 Enlarged prostate without lower urinary tract symptoms: Secondary | ICD-10-CM | POA: Insufficient documentation

## 2012-03-15 DIAGNOSIS — S63613A Unspecified sprain of left middle finger, initial encounter: Secondary | ICD-10-CM

## 2012-03-15 DIAGNOSIS — J4489 Other specified chronic obstructive pulmonary disease: Secondary | ICD-10-CM | POA: Insufficient documentation

## 2012-03-15 DIAGNOSIS — Z87891 Personal history of nicotine dependence: Secondary | ICD-10-CM | POA: Insufficient documentation

## 2012-03-15 DIAGNOSIS — Z7982 Long term (current) use of aspirin: Secondary | ICD-10-CM | POA: Insufficient documentation

## 2012-03-15 DIAGNOSIS — Z9181 History of falling: Secondary | ICD-10-CM | POA: Insufficient documentation

## 2012-03-15 DIAGNOSIS — F329 Major depressive disorder, single episode, unspecified: Secondary | ICD-10-CM | POA: Insufficient documentation

## 2012-03-15 DIAGNOSIS — J42 Unspecified chronic bronchitis: Secondary | ICD-10-CM | POA: Insufficient documentation

## 2012-03-15 HISTORY — DX: Type 2 diabetes mellitus without complications: E11.9

## 2012-03-15 NOTE — ED Provider Notes (Signed)
History     CSN: [id number]  Arrival date & time 03/15/12  7829   First MD Initiated Contact with Patient 03/15/12 0935      Chief Complaint  Patient presents with  . Finger Injury    (Consider location/radiation/quality/duration/timing/severity/associated sxs/prior treatment) HPI Comments: Pt states he falls frequently.  Walks with a cane.  Fell last PM  And injured L 3rd finger.  No other injuries or complaints.  The history is provided by the patient. No language interpreter was used.    Past Medical History  Diagnosis Date  . Hypertension   . Depression   . Anxiety   . COPD (chronic obstructive pulmonary disease)   . Chronic bronchitis   . BPH (benign prostatic hyperplasia)   . Diabetes mellitus without complication     Past Surgical History  Procedure Date  . Left knee replacement   . Left knee revised x 3   . Right knee replacement   . Tarsal tunnel repair   . Carpal tunnel right hand   . Cervical fusion   . Thyroidectomy, partial   . Tonsillectomy   . Multiple knee arthroscopies     Family History  Problem Relation Age of Onset  . Colon cancer Neg Hx     History  Substance Use Topics  . Smoking status: Former Smoker -- 1.0 packs/day for 40 years    Types: Cigarettes    Quit date: 06/29/2010  . Smokeless tobacco: Never Used     Comment: using electronic cigarettes  . Alcohol Use: No      Review of Systems  Musculoskeletal:       Finger injury   Skin: Positive for wound.  Neurological: Negative for weakness and numbness.  All other systems reviewed and are negative.    Allergies  Feldene  Home Medications   Current Outpatient Rx  Name  Route  Sig  Dispense  Refill  . ALBUTEROL SULFATE HFA 108 (90 BASE) MCG/ACT IN AERS   Inhalation   Inhale 2 puffs into the lungs every 6 (six) hours as needed. For shortness of  breath         . ASPIRIN 325 MG PO TABS   Oral   Take 325 mg by mouth daily.          . ATORVASTATIN CALCIUM 20 MG  PO TABS   Oral   Take 20 mg by mouth daily.         Marland Kitchen DIAZEPAM 10 MG PO TABS   Oral   Take 10 mg by mouth every 6 (six) hours as needed. For muscle spasms         . DICLOFENAC SODIUM 75 MG PO TBEC   Oral   Take 75 mg by mouth 2 (two) times daily.           Marland Kitchen GABAPENTIN 300 MG PO CAPS   Oral   Take 300 mg by mouth daily.         Marland Kitchen LOSARTAN POTASSIUM 100 MG PO TABS   Oral   Take 100 mg by mouth daily.         Marland Kitchen METFORMIN HCL 500 MG PO TABS   Oral   Take 500 mg by mouth 2 (two) times daily.         Marland Kitchen METOPROLOL TARTRATE 50 MG PO TABS   Oral   Take 50 mg by mouth daily.         Marland Kitchen OMEPRAZOLE MAGNESIUM 20 MG PO TBEC  Oral   Take 20 mg by mouth daily.         . OXYCODONE-ACETAMINOPHEN 10-325 MG PO TABS   Oral   Take 1 tablet by mouth every 4 (four) hours as needed. For pain         . TAMSULOSIN HCL 0.4 MG PO CAPS   Oral   Take 0.4 mg by mouth daily.           . VENLAFAXINE HCL ER 150 MG PO CP24   Oral   Take 150 mg by mouth daily.           Marland Kitchen ZOLPIDEM TARTRATE 10 MG PO TABS   Oral   Take 10 mg by mouth at bedtime.         Marland Kitchen NITROGLYCERIN 0.4 MG SL SUBL   Sublingual   Place 0.4 mg under the tongue every 5 (five) minutes x 3 doses as needed. For chest pain           BP 147/87  Pulse 60  Temp 98.6 F (37 C) (Oral)  Resp 18  Wt 275 lb (124.739 kg)  SpO2 96%  Physical Exam  Nursing note and vitals reviewed. Constitutional: He is oriented to person, place, and time. He appears well-developed and well-nourished.  HENT:  Head: Normocephalic and atraumatic.  Eyes: EOM are normal.  Neck: Normal range of motion.  Cardiovascular: Normal rate, regular rhythm and intact distal pulses.   Pulmonary/Chest: Effort normal. No respiratory distress.  Abdominal: Soft. He exhibits no distension. There is no tenderness.  Musculoskeletal: He exhibits tenderness.       Left hand: He exhibits decreased range of motion and tenderness. He exhibits no  deformity and no laceration. normal sensation noted. Normal strength noted.       Hands: Neurological: He is alert and oriented to person, place, and time.  Skin: Skin is warm and dry.  Psychiatric: He has a normal mood and affect. Judgment normal.    ED Course  Procedures (including critical care time)  Labs Reviewed - No data to display Dg Hand Complete Left  03/15/2012  *RADIOLOGY REPORT*  Clinical Data: Pain post fall  LEFT HAND - COMPLETE 3+ VIEW  Comparison: None.  Findings: Three views of the left hand submitted.  No acute fracture or subluxation.  No radiopaque foreign body.  IMPRESSION: No acute fracture or subluxation.   Original Report Authenticated By: Natasha Mead, M.D.      1. Sprain of left middle finger       MDM  No fxs  Splint and buddy tape Ice, elevation F/u with PCP prn.        Evalina Field, [place] 03/15/12 1025

## 2012-03-15 NOTE — ED Notes (Signed)
Patient c/o fall last night. States "my knees gave out". Swelling to left third finger, PMS intact.

## 2012-03-15 NOTE — ED Provider Notes (Signed)
Medical screening examination/treatment/procedure(s) were performed by non-physician practitioner and as supervising physician I was immediately available for consultation/collaboration.   Charles B. Bernette Mayers, MD 03/15/12 [phone]

## 2012-09-19 ENCOUNTER — Other Ambulatory Visit (HOSPITAL_COMMUNITY): Payer: Self-pay | Admitting: Internal Medicine

## 2012-09-19 DIAGNOSIS — G459 Transient cerebral ischemic attack, unspecified: Secondary | ICD-10-CM

## 2012-09-23 ENCOUNTER — Ambulatory Visit (HOSPITAL_COMMUNITY)
Admission: RE | Admit: 2012-09-23 | Discharge: 2012-09-23 | Disposition: A | Discharge status: Home / Self Care | Payer: Medicare Other | Source: Ambulatory Visit | Attending: Internal Medicine | Admitting: Internal Medicine

## 2012-09-23 DIAGNOSIS — G459 Transient cerebral ischemic attack, unspecified: Secondary | ICD-10-CM | POA: Insufficient documentation

## 2012-10-13 ENCOUNTER — Encounter: Payer: Self-pay | Admitting: *Deleted

## 2012-10-13 DIAGNOSIS — M25569 Pain in unspecified knee: Secondary | ICD-10-CM | POA: Insufficient documentation

## 2012-10-13 DIAGNOSIS — E782 Mixed hyperlipidemia: Secondary | ICD-10-CM

## 2012-10-13 DIAGNOSIS — F329 Major depressive disorder, single episode, unspecified: Secondary | ICD-10-CM

## 2012-10-13 DIAGNOSIS — E119 Type 2 diabetes mellitus without complications: Secondary | ICD-10-CM | POA: Insufficient documentation

## 2012-10-13 DIAGNOSIS — R0789 Other chest pain: Secondary | ICD-10-CM | POA: Insufficient documentation

## 2012-10-13 DIAGNOSIS — R252 Cramp and spasm: Secondary | ICD-10-CM | POA: Insufficient documentation

## 2012-10-14 ENCOUNTER — Ambulatory Visit: Payer: BC Managed Care – PPO | Admitting: Cardiovascular Disease

## 2012-10-30 ENCOUNTER — Encounter: Payer: Self-pay | Admitting: *Deleted

## 2012-10-31 ENCOUNTER — Ambulatory Visit: Payer: Self-pay | Admitting: Cardiovascular Disease

## 2012-11-13 ENCOUNTER — Ambulatory Visit: Payer: Self-pay | Admitting: Cardiovascular Disease

## 2012-11-27 ENCOUNTER — Ambulatory Visit: Payer: Self-pay | Admitting: Cardiovascular Disease

## 2012-12-12 ENCOUNTER — Ambulatory Visit (INDEPENDENT_AMBULATORY_CARE_PROVIDER_SITE_OTHER): Payer: Medicare Other | Admitting: Internal Medicine

## 2012-12-12 ENCOUNTER — Encounter: Payer: Self-pay | Admitting: *Deleted

## 2012-12-12 ENCOUNTER — Encounter: Payer: Self-pay | Admitting: Internal Medicine

## 2012-12-12 VITALS — BP 138/84 | HR 59 | Ht 73.0 in | Wt 210.8 lb

## 2012-12-12 DIAGNOSIS — I10 Essential (primary) hypertension: Secondary | ICD-10-CM

## 2012-12-12 DIAGNOSIS — R079 Chest pain, unspecified: Secondary | ICD-10-CM

## 2012-12-12 NOTE — Patient Instructions (Addendum)
Your physician recommends that you schedule a follow-up appointment in: AS NEEDED  Your physician has requested that you have a lexiscan myoview. For further information please visit [email or web address]. Please follow instruction sheet, as given.    

## 2012-12-12 NOTE — Progress Notes (Signed)
HPI Patinet seen by Hughie Closs  Hx CP for past couple years Mid chest goest to L neck.  Not associated with activty.  Breathing makes worse.  NO cough.  Occurs a couple times per month  catn get with sitting Having now.   Uses cane.  WOrking in garden  If gets hot can pass out.  Occurred last couple years.   Allergies  Allergen Reactions  . Feldene [Piroxicam] Rash    Current Outpatient Prescriptions  Medication Sig Dispense Refill  . aspirin 325 MG tablet Take 325 mg by mouth daily.       Marland Kitchen atorvastatin (LIPITOR) 20 MG tablet Take 20 mg by mouth daily.      . diazepam (VALIUM) 10 MG tablet Take 10 mg by mouth every 6 (six) hours as needed. For muscle spasms      . diclofenac (VOLTAREN) 75 MG EC tablet Take 75 mg by mouth 2 (two) times daily.        Marland Kitchen gabapentin (NEURONTIN) 300 MG capsule Take 300 mg by mouth 3 (three) times daily.       Marland Kitchen LORazepam (ATIVAN) 1 MG tablet Take 1 mg by mouth at bedtime.       . metFORMIN (GLUCOPHAGE) 500 MG tablet Take 500 mg by mouth 2 (two) times daily.      . metoprolol (LOPRESSOR) 50 MG tablet Take 50 mg by mouth 2 (two) times daily.       . nitroGLYCERIN (NITROSTAT) 0.4 MG SL tablet Place 0.4 mg under the tongue every 5 (five) minutes x 3 doses as needed. For chest pain      . omeprazole (PRILOSEC OTC) 20 MG tablet Take 20 mg by mouth daily.      Marland Kitchen oxyCODONE-acetaminophen (PERCOCET) 10-325 MG per tablet Take 1 tablet by mouth every 4 (four) hours as needed. For pain      . Tamsulosin HCl (FLOMAX) 0.4 MG CAPS Take 0.4 mg by mouth daily.        Marland Kitchen venlafaxine (EFFEXOR-XR) 150 MG 24 hr capsule Take 150 mg by mouth daily.         No current facility-administered medications for this visit.    Past Medical History  Diagnosis Date  . Hypertension   . Depression   . Anxiety   . COPD (chronic obstructive pulmonary disease)   . Chronic bronchitis   . BPH (benign prostatic hyperplasia)   . Diabetes mellitus without complication     Past Surgical History   Procedure Laterality Date  . Left knee replacement    . Left knee revised x 3    . Right knee replacement    . Tarsal tunnel repair    . Carpal tunnel right hand    . Cervical fusion    . Thyroidectomy, partial    . Tonsillectomy    . Multiple knee arthroscopies      Family History  Problem Relation Age of Onset  . Colon cancer Neg Hx     History   Social History  . Marital Status: Married    Spouse Name: N/A    Number of Children: N/A  . Years of Education: N/A   Occupational History  . Not on file.   Social History Main Topics  . Smoking status: Former Smoker -- 1.00 packs/day for 40 years    Types: Cigarettes    Quit date: 06/29/2010  . Smokeless tobacco: Never Used     Comment: using electronic cigarettes  . Alcohol Use:  No  . Drug Use: No  . Sexual Activity: Yes    Partners: Female    Pharmacist, hospital Protection: None     Comment: spouse   Other Topics Concern  . Not on file   Social History Narrative  . No narrative on file    Review of Systems:  All systems reviewed.  They are negative to the above problem except as previously stated.  Vital Signs: BP 138/84  Pulse 59  Ht 6\' 1"  (1.854 m)  Wt 210 lb 12 oz (95.596 kg)  BMI 27.81 kg/m2  Physical Exam Patient is in NAD HEENT:  Normocephalic, atraumatic. EOMI, PERRLA.  Neck: JVP is normal.  No bruits. Tender t palpitation of L neck   Lungs: clear to auscultation. No rales no wheezes. Rhonchi bilaterally Heart: Regular rate and rhythm. Normal S1, S2. No S3.   No significant murmurs. PMI not displaced. Chest:  Minimal tenderness.   Abdomen:  Supple, nontender. Normal bowel sounds. No masses. No hepatomegaly.  Extremities:   Good distal pulses throughout. No lower extremity edema.  Musculoskeletal :moving all extremities.  Neuro:   alert and oriented x3.  CN II-XII grossly intact.  EKG  SB 57 Assessment and Plan: 1  CP/Neck pain.  Neck pain appears to be muscular.  chst pain is probably also  noncardiac given how presents But he is not very active  Risks for CAD  Would sched lexiscan myoview  2.  Tobacco  Patient counselled on tobacco  3.  HL  Will need to get lipids  4.  Syncope  Patients with spells over years that sound orthostatic  Has warning symptoms  Rec following when feels bad and sit down.

## 2012-12-18 ENCOUNTER — Encounter: Payer: Self-pay | Admitting: Internal Medicine

## 2012-12-19 ENCOUNTER — Encounter (HOSPITAL_COMMUNITY): Payer: Self-pay

## 2012-12-19 ENCOUNTER — Encounter (HOSPITAL_COMMUNITY)
Admission: RE | Admit: 2012-12-19 | Discharge: 2012-12-19 | Disposition: A | Discharge status: Home / Self Care | Payer: Medicare Other | Source: Ambulatory Visit | Attending: Internal Medicine | Admitting: Internal Medicine

## 2012-12-19 DIAGNOSIS — R079 Chest pain, unspecified: Secondary | ICD-10-CM

## 2012-12-19 DIAGNOSIS — R0789 Other chest pain: Secondary | ICD-10-CM

## 2012-12-19 MED ORDER — SODIUM CHLORIDE 0.9 % IJ SOLN
INTRAMUSCULAR | Status: AC
  Administered 2012-12-19: 10 mL via INTRAVENOUS
  Filled 2012-12-19: qty 10

## 2012-12-19 MED ORDER — TECHNETIUM TC 99M SESTAMIBI - CARDIOLITE
10.0000 | Freq: Once | INTRAVENOUS | Status: AC | PRN
  Administered 2012-12-19: 10 via INTRAVENOUS

## 2012-12-19 MED ORDER — TECHNETIUM TC 99M SESTAMIBI - CARDIOLITE
30.0000 | Freq: Once | INTRAVENOUS | Status: AC | PRN
  Administered 2012-12-19: 12:00:00 30 via INTRAVENOUS

## 2012-12-19 MED ORDER — REGADENOSON 0.4 MG/5ML IV SOLN
INTRAVENOUS | Status: AC
  Administered 2012-12-19: 0.4 mg via INTRAVENOUS
  Filled 2012-12-19: qty 5

## 2012-12-19 NOTE — Progress Notes (Signed)
Stress Lab Nurses Notes - Chris Lopez  Chris Lopez 12/19/2012 Reason for doing test: Chest Pain Type of test: Marlane Hatcher Nurse performing test: Parke Poisson, RN Nuclear Medicine Tech: Lyndel Pleasure Echo Tech: Not Applicable MD performing test: Chris / Joni Reining NP Family MD: Dr. Margo Aye Test explained and consent signed: yes IV started: 22g jelco, Saline lock flushed, No redness or edema and Saline lock started in radiology Symptoms: SOB & stomach pressure Treatment/Intervention: None Reason test stopped: protocol completed After recovery IV was: Discontinued via X-ray tech and No redness or edema Patient to return to Nuc. Med at : 12:30 Patient discharged: Home Patient's Condition upon discharge was: stable Comments: During test BP 111/72 & HR 71.  Recovery BP 130/76 & HR 57.  Symptoms resolved in recovery. Chris Lopez

## 2013-12-24 ENCOUNTER — Other Ambulatory Visit (HOSPITAL_COMMUNITY): Payer: Self-pay | Admitting: Orthopaedic Surgery

## 2013-12-24 DIAGNOSIS — M545 Low back pain, unspecified: Secondary | ICD-10-CM

## 2013-12-28 ENCOUNTER — Ambulatory Visit (HOSPITAL_COMMUNITY)
Admission: RE | Admit: 2013-12-28 | Discharge: 2013-12-28 | Disposition: A | Discharge status: Home / Self Care | Payer: Medicare PPO | Source: Ambulatory Visit | Attending: Orthopaedic Surgery | Admitting: Orthopaedic Surgery

## 2013-12-28 DIAGNOSIS — M51379 Other intervertebral disc degeneration, lumbosacral region without mention of lumbar back pain or lower extremity pain: Secondary | ICD-10-CM | POA: Insufficient documentation

## 2013-12-28 DIAGNOSIS — M4800 Spinal stenosis, site unspecified: Secondary | ICD-10-CM | POA: Diagnosis not present

## 2013-12-28 DIAGNOSIS — M545 Low back pain, unspecified: Secondary | ICD-10-CM

## 2013-12-28 DIAGNOSIS — M79609 Pain in unspecified limb: Secondary | ICD-10-CM | POA: Diagnosis present

## 2013-12-28 DIAGNOSIS — M5137 Other intervertebral disc degeneration, lumbosacral region: Secondary | ICD-10-CM | POA: Diagnosis not present

## 2013-12-28 DIAGNOSIS — R29898 Other symptoms and signs involving the musculoskeletal system: Secondary | ICD-10-CM | POA: Diagnosis present

## 2014-04-16 DIAGNOSIS — M5416 Radiculopathy, lumbar region: Secondary | ICD-10-CM | POA: Diagnosis not present

## 2014-04-16 DIAGNOSIS — M533 Sacrococcygeal disorders, not elsewhere classified: Secondary | ICD-10-CM | POA: Diagnosis not present

## 2014-04-16 DIAGNOSIS — G8929 Other chronic pain: Secondary | ICD-10-CM | POA: Diagnosis not present

## 2014-04-16 DIAGNOSIS — M545 Low back pain: Secondary | ICD-10-CM | POA: Diagnosis not present

## 2014-05-05 DIAGNOSIS — M47817 Spondylosis without myelopathy or radiculopathy, lumbosacral region: Secondary | ICD-10-CM | POA: Diagnosis not present

## 2014-05-05 DIAGNOSIS — M545 Low back pain: Secondary | ICD-10-CM | POA: Diagnosis not present

## 2014-05-10 DIAGNOSIS — M545 Low back pain: Secondary | ICD-10-CM | POA: Diagnosis not present

## 2014-05-10 DIAGNOSIS — M25562 Pain in left knee: Secondary | ICD-10-CM | POA: Diagnosis not present

## 2014-05-10 DIAGNOSIS — Z720 Tobacco use: Secondary | ICD-10-CM | POA: Diagnosis not present

## 2014-05-10 DIAGNOSIS — M25561 Pain in right knee: Secondary | ICD-10-CM | POA: Diagnosis not present

## 2014-06-09 DIAGNOSIS — M47817 Spondylosis without myelopathy or radiculopathy, lumbosacral region: Secondary | ICD-10-CM | POA: Diagnosis not present

## 2014-06-09 DIAGNOSIS — M62830 Muscle spasm of back: Secondary | ICD-10-CM | POA: Diagnosis not present

## 2014-06-21 DIAGNOSIS — M25562 Pain in left knee: Secondary | ICD-10-CM | POA: Diagnosis not present

## 2014-06-21 DIAGNOSIS — Z720 Tobacco use: Secondary | ICD-10-CM | POA: Diagnosis not present

## 2014-06-21 DIAGNOSIS — Z5181 Encounter for therapeutic drug level monitoring: Secondary | ICD-10-CM | POA: Diagnosis not present

## 2014-06-21 DIAGNOSIS — M25561 Pain in right knee: Secondary | ICD-10-CM | POA: Diagnosis not present

## 2014-07-05 DIAGNOSIS — M47817 Spondylosis without myelopathy or radiculopathy, lumbosacral region: Secondary | ICD-10-CM | POA: Diagnosis not present

## 2014-07-05 DIAGNOSIS — M62830 Muscle spasm of back: Secondary | ICD-10-CM | POA: Diagnosis not present

## 2014-07-12 DIAGNOSIS — E782 Mixed hyperlipidemia: Secondary | ICD-10-CM | POA: Diagnosis not present

## 2014-07-12 DIAGNOSIS — E119 Type 2 diabetes mellitus without complications: Secondary | ICD-10-CM | POA: Diagnosis not present

## 2014-08-02 DIAGNOSIS — E782 Mixed hyperlipidemia: Secondary | ICD-10-CM | POA: Diagnosis not present

## 2014-08-02 DIAGNOSIS — E119 Type 2 diabetes mellitus without complications: Secondary | ICD-10-CM | POA: Diagnosis not present

## 2014-08-02 DIAGNOSIS — G3189 Other specified degenerative diseases of nervous system: Secondary | ICD-10-CM | POA: Diagnosis not present

## 2014-08-02 DIAGNOSIS — M25561 Pain in right knee: Secondary | ICD-10-CM | POA: Diagnosis not present

## 2014-08-02 DIAGNOSIS — G894 Chronic pain syndrome: Secondary | ICD-10-CM | POA: Diagnosis not present

## 2014-08-02 DIAGNOSIS — Z6833 Body mass index (BMI) 33.0-33.9, adult: Secondary | ICD-10-CM | POA: Diagnosis not present

## 2014-08-02 DIAGNOSIS — M25562 Pain in left knee: Secondary | ICD-10-CM | POA: Diagnosis not present

## 2014-08-02 DIAGNOSIS — Z720 Tobacco use: Secondary | ICD-10-CM | POA: Diagnosis not present

## 2014-08-18 DIAGNOSIS — N189 Chronic kidney disease, unspecified: Secondary | ICD-10-CM | POA: Diagnosis not present

## 2014-08-18 DIAGNOSIS — J449 Chronic obstructive pulmonary disease, unspecified: Secondary | ICD-10-CM | POA: Diagnosis not present

## 2014-08-18 DIAGNOSIS — Z91030 Bee allergy status: Secondary | ICD-10-CM | POA: Diagnosis not present

## 2014-08-18 DIAGNOSIS — F17210 Nicotine dependence, cigarettes, uncomplicated: Secondary | ICD-10-CM | POA: Diagnosis not present

## 2014-08-18 DIAGNOSIS — Z7982 Long term (current) use of aspirin: Secondary | ICD-10-CM | POA: Diagnosis not present

## 2014-08-18 DIAGNOSIS — M5416 Radiculopathy, lumbar region: Secondary | ICD-10-CM | POA: Diagnosis not present

## 2014-08-18 DIAGNOSIS — E119 Type 2 diabetes mellitus without complications: Secondary | ICD-10-CM | POA: Diagnosis not present

## 2014-08-18 DIAGNOSIS — I129 Hypertensive chronic kidney disease with stage 1 through stage 4 chronic kidney disease, or unspecified chronic kidney disease: Secondary | ICD-10-CM | POA: Diagnosis not present

## 2014-08-18 DIAGNOSIS — Z888 Allergy status to other drugs, medicaments and biological substances status: Secondary | ICD-10-CM | POA: Diagnosis not present

## 2014-09-09 DIAGNOSIS — Z79899 Other long term (current) drug therapy: Secondary | ICD-10-CM | POA: Diagnosis not present

## 2014-09-09 DIAGNOSIS — I10 Essential (primary) hypertension: Secondary | ICD-10-CM | POA: Diagnosis not present

## 2014-09-09 DIAGNOSIS — E119 Type 2 diabetes mellitus without complications: Secondary | ICD-10-CM | POA: Diagnosis not present

## 2014-09-09 DIAGNOSIS — M5417 Radiculopathy, lumbosacral region: Secondary | ICD-10-CM | POA: Diagnosis not present

## 2014-09-27 DIAGNOSIS — M1711 Unilateral primary osteoarthritis, right knee: Secondary | ICD-10-CM | POA: Diagnosis not present

## 2014-09-27 DIAGNOSIS — M1712 Unilateral primary osteoarthritis, left knee: Secondary | ICD-10-CM | POA: Diagnosis not present

## 2014-09-27 DIAGNOSIS — Z5181 Encounter for therapeutic drug level monitoring: Secondary | ICD-10-CM | POA: Diagnosis not present

## 2014-09-27 DIAGNOSIS — Z720 Tobacco use: Secondary | ICD-10-CM | POA: Diagnosis not present

## 2014-09-27 DIAGNOSIS — Z6831 Body mass index (BMI) 31.0-31.9, adult: Secondary | ICD-10-CM | POA: Diagnosis not present

## 2014-10-27 DIAGNOSIS — Z6830 Body mass index (BMI) 30.0-30.9, adult: Secondary | ICD-10-CM | POA: Diagnosis not present

## 2014-10-27 DIAGNOSIS — M25561 Pain in right knee: Secondary | ICD-10-CM | POA: Diagnosis not present

## 2014-10-27 DIAGNOSIS — Z720 Tobacco use: Secondary | ICD-10-CM | POA: Diagnosis not present

## 2014-10-27 DIAGNOSIS — Z8709 Personal history of other diseases of the respiratory system: Secondary | ICD-10-CM | POA: Diagnosis not present

## 2014-10-27 DIAGNOSIS — M25562 Pain in left knee: Secondary | ICD-10-CM | POA: Diagnosis not present

## 2014-10-27 DIAGNOSIS — M1711 Unilateral primary osteoarthritis, right knee: Secondary | ICD-10-CM | POA: Diagnosis not present

## 2014-10-27 DIAGNOSIS — M1712 Unilateral primary osteoarthritis, left knee: Secondary | ICD-10-CM | POA: Diagnosis not present

## 2014-10-27 DIAGNOSIS — M79662 Pain in left lower leg: Secondary | ICD-10-CM | POA: Diagnosis not present

## 2014-12-08 DIAGNOSIS — Z6830 Body mass index (BMI) 30.0-30.9, adult: Secondary | ICD-10-CM | POA: Diagnosis not present

## 2014-12-08 DIAGNOSIS — M79662 Pain in left lower leg: Secondary | ICD-10-CM | POA: Diagnosis not present

## 2014-12-08 DIAGNOSIS — Z8709 Personal history of other diseases of the respiratory system: Secondary | ICD-10-CM | POA: Diagnosis not present

## 2014-12-08 DIAGNOSIS — Z720 Tobacco use: Secondary | ICD-10-CM | POA: Diagnosis not present

## 2014-12-08 DIAGNOSIS — M25562 Pain in left knee: Secondary | ICD-10-CM | POA: Diagnosis not present

## 2015-01-25 DIAGNOSIS — Z125 Encounter for screening for malignant neoplasm of prostate: Secondary | ICD-10-CM | POA: Diagnosis not present

## 2015-01-25 DIAGNOSIS — E119 Type 2 diabetes mellitus without complications: Secondary | ICD-10-CM | POA: Diagnosis not present

## 2015-02-07 DIAGNOSIS — M25569 Pain in unspecified knee: Secondary | ICD-10-CM | POA: Diagnosis not present

## 2015-02-07 DIAGNOSIS — M25562 Pain in left knee: Secondary | ICD-10-CM | POA: Diagnosis not present

## 2015-02-07 DIAGNOSIS — M25561 Pain in right knee: Secondary | ICD-10-CM | POA: Diagnosis not present

## 2015-02-07 DIAGNOSIS — D696 Thrombocytopenia, unspecified: Secondary | ICD-10-CM | POA: Diagnosis not present

## 2015-02-07 DIAGNOSIS — E119 Type 2 diabetes mellitus without complications: Secondary | ICD-10-CM | POA: Diagnosis not present

## 2015-02-07 DIAGNOSIS — Z6829 Body mass index (BMI) 29.0-29.9, adult: Secondary | ICD-10-CM | POA: Diagnosis not present

## 2015-02-07 DIAGNOSIS — E782 Mixed hyperlipidemia: Secondary | ICD-10-CM | POA: Diagnosis not present

## 2015-02-07 DIAGNOSIS — D509 Iron deficiency anemia, unspecified: Secondary | ICD-10-CM | POA: Diagnosis not present

## 2015-02-07 DIAGNOSIS — Z23 Encounter for immunization: Secondary | ICD-10-CM | POA: Diagnosis not present

## 2015-02-07 DIAGNOSIS — Z720 Tobacco use: Secondary | ICD-10-CM | POA: Diagnosis not present

## 2015-02-07 DIAGNOSIS — J209 Acute bronchitis, unspecified: Secondary | ICD-10-CM | POA: Diagnosis not present

## 2015-02-07 DIAGNOSIS — G3189 Other specified degenerative diseases of nervous system: Secondary | ICD-10-CM | POA: Diagnosis not present

## 2015-02-07 DIAGNOSIS — Z8709 Personal history of other diseases of the respiratory system: Secondary | ICD-10-CM | POA: Diagnosis not present

## 2015-04-08 DIAGNOSIS — M25561 Pain in right knee: Secondary | ICD-10-CM | POA: Diagnosis not present

## 2015-04-08 DIAGNOSIS — Z8709 Personal history of other diseases of the respiratory system: Secondary | ICD-10-CM | POA: Diagnosis not present

## 2015-04-08 DIAGNOSIS — Z6830 Body mass index (BMI) 30.0-30.9, adult: Secondary | ICD-10-CM | POA: Diagnosis not present

## 2015-04-08 DIAGNOSIS — M25562 Pain in left knee: Secondary | ICD-10-CM | POA: Diagnosis not present

## 2015-04-08 DIAGNOSIS — J449 Chronic obstructive pulmonary disease, unspecified: Secondary | ICD-10-CM | POA: Diagnosis not present

## 2015-04-08 DIAGNOSIS — E079 Disorder of thyroid, unspecified: Secondary | ICD-10-CM | POA: Diagnosis not present

## 2015-04-08 DIAGNOSIS — I10 Essential (primary) hypertension: Secondary | ICD-10-CM | POA: Diagnosis not present

## 2015-05-16 ENCOUNTER — Telehealth: Payer: Self-pay | Admitting: Orthopaedic Surgery

## 2015-05-16 NOTE — Telephone Encounter (Signed)
Patient requesting refill on Oxycodone 7.5/325mg  Please advise.

## 2015-05-17 MED ORDER — OXYCODONE-ACETAMINOPHEN 7.5-325 MG PO TABS: 1.0000 | ORAL_TABLET | ORAL | Status: DC | PRN

## 2015-05-17 NOTE — Telephone Encounter (Signed)
Rx printed

## 2015-05-18 NOTE — Telephone Encounter (Signed)
Patient picked up meds.

## 2015-05-26 ENCOUNTER — Telehealth: Payer: Self-pay | Admitting: Orthopaedic Surgery

## 2015-05-26 NOTE — Telephone Encounter (Signed)
Prescription faxed to patient's pharmacy. 

## 2015-06-07 ENCOUNTER — Ambulatory Visit (INDEPENDENT_AMBULATORY_CARE_PROVIDER_SITE_OTHER): Payer: Commercial Managed Care - HMO | Admitting: Orthopaedic Surgery

## 2015-06-07 VITALS — BP 145/82 | HR 62 | Temp 97.9°F | Ht 72.0 in | Wt 215.6 lb

## 2015-06-07 DIAGNOSIS — M25561 Pain in right knee: Secondary | ICD-10-CM | POA: Diagnosis not present

## 2015-06-07 DIAGNOSIS — M25562 Pain in left knee: Secondary | ICD-10-CM

## 2015-06-07 MED ORDER — OXYCODONE-ACETAMINOPHEN 7.5-325 MG PO TABS: 1.0000 | ORAL_TABLET | ORAL | Status: DC | PRN

## 2015-06-07 NOTE — Progress Notes (Signed)
Patient Chris Lopez, male DOB:05/11/1945, 70 y.o. LTJ:[id number]  Chief Complaint  Patient presents with  . Follow-up    bilateral knee pain left worse than right    HPI  Chris Lopez is a 71 y.o. male who has chronic pain in both knees more on the left than the right. He has bilateral total knees.  He has been followed at [facility].  He uses a knee sleeve on the left knee.  He has no new trauma.  He has deep constant pain with no acute episode.  He takes his pain medicine which works.   HPI  Body mass index is 29.23 kg/(m^2).  Review of Systems  Patient has Diabetes Mellitus. Patient has hypertension. Patient has COPD or shortness of breath. Patient does not have BMI > 35. Patient has current smoking history.  Review of Systems  Past Medical History  Diagnosis Date  . Hypertension   . Depression   . Anxiety   . COPD (chronic obstructive pulmonary disease)   . Chronic bronchitis   . BPH (benign prostatic hyperplasia)   . Diabetes mellitus without complication     Past Surgical History  Procedure Laterality Date  . Left knee replacement    . Left knee revised x 3    . Right knee replacement    . Tarsal tunnel repair    . Carpal tunnel right hand    . Cervical fusion    . Thyroidectomy, partial    . Tonsillectomy    . Multiple knee arthroscopies      Family History  Problem Relation Age of Onset  . Colon cancer Neg Hx     Social History Social History  Substance Use Topics  . Smoking status: Former Smoker -- 1.00 packs/day for 40 years    Types: Cigarettes    Quit date: 06/29/2010  . Smokeless tobacco: Never Used     Comment: using electronic cigarettes  . Alcohol Use: No    Allergies  Allergen Reactions  . Bee Venom Anaphylaxis    Allergic to bee stings not the kit  . Feldene [Piroxicam] Rash and Dermatitis    Current Outpatient Prescriptions  Medication Sig Dispense Refill  . aspirin 325 MG tablet Take 325 mg by mouth daily.      . diazepam (VALIUM) 10 MG tablet Take 10 mg by mouth every 6 (six) hours as needed. Reported on 06/07/2015    . diclofenac (VOLTAREN) 75 MG EC tablet Take 75 mg by mouth 2 (two) times daily.      Marland Kitchen gabapentin (NEURONTIN) 300 MG capsule Take 300 mg by mouth 3 (three) times daily. Reported on 06/07/2015    . losartan (COZAAR) 100 MG tablet Take 100 mg by mouth daily.    . metFORMIN (GLUCOPHAGE) 500 MG tablet Take 500 mg by mouth 2 (two) times daily. Reported on 06/07/2015    . metoprolol (LOPRESSOR) 50 MG tablet Take 50 mg by mouth 2 (two) times daily.     . nitroGLYCERIN (NITROSTAT) 0.4 MG SL tablet Place 0.4 mg under the tongue every 5 (five) minutes x 3 doses as needed. For chest pain    . oxyCODONE-acetaminophen (PERCOCET) 7.5-325 MG tablet Take 1 tablet by mouth every 4 (four) hours as needed for moderate pain or severe pain (Must last 21 days). 90 tablet 0  . Tamsulosin HCl (FLOMAX) 0.4 MG CAPS Take 0.4 mg by mouth daily. Reported on 06/07/2015    . atorvastatin (LIPITOR) 20 MG tablet Take  20 mg by mouth daily. Reported on 06/07/2015    . diazepam (VALIUM) 5 MG tablet TAKE ONE TABLET BY MOUTH EVERY 6 HOURS AS NEEDED FOR PAIN/SPASMS FOR 10 DAYS (Patient not taking: Reported on 06/07/2015) 40 tablet 3  . LORazepam (ATIVAN) 1 MG tablet Take 1 mg by mouth at bedtime. Reported on 06/07/2015    . omeprazole (PRILOSEC OTC) 20 MG tablet Take 20 mg by mouth daily. Reported on 06/07/2015    . venlafaxine (EFFEXOR-XR) 150 MG 24 hr capsule Take 150 mg by mouth daily.       No current facility-administered medications for this visit.     PLAN Call if any problems.  Precautions discussed.  Continue current medications.   Return to clinic 2 months   Physical Exam  Blood pressure 145/82, pulse 62, temperature 97.9 F (36.6 C), height 6' (1.829 m), weight 215 lb 9.6 oz (97.796 kg).  Constitutional: overall normal hygiene, normal nutrition, well developed, normal grooming, normal body habitus. Assistive  device:cane  Musculoskeletal: gait and station Limp left, muscle tone and strength are normal, no tremors or atrophy is present.  .  Neurological: coordination overall normal.  Deep tendon reflex/nerve stretch intact.  Sensation normal.  Cranial nerves II-XII intact.   Skin:   He has scars anterior of both knees, no lesions, ulcers or rashes. No psoriasis.  Psychiatric: Alert and oriented x 3.  Recent memory intact, remote memory unclear.  Normal mood and affect. Well groomed.  Good eye contact.  Cardiovascular: overall no swelling, no varicosities, no edema bilaterally, normal temperatures of the legs and arms, no clubbing, cyanosis and good capillary refill.  Lymphatic: palpation is normal. The right lower extremity is examined:  Inspection:  Thigh:  Non-tender and no defects  Knee has swelling 1+ effusion.                        Joint tenderness is present                        Patient is not tender over the medial joint line  Lower Leg:  Has normal appearance and no tenderness or defects  Ankle:  Non-tender and no defects  Foot:  Non-tender and no defects Range of Motion:  Knee:  Range of motion is: 0 to 100 with pain                        Crepitus is  present  Ankle:  Range of motion is normal. Strength and Tone:  The left lower extremity has normal strength and tone. Stability:  Knee:  The knee is stable.  Ankle:  The ankle is stable.  I have talked to him about going back to [facility] for examination of the left total knee.    The patient has been educated about the nature of the problem(s) and counseled on treatment options.  The patient appeared to understand what I have discussed and is in agreement with it.   The patient has been educated about the nature of the problem(s) and counseled on treatment options.  The patient appeared to understand what I have discussed and is in agreement with it.  PLAN Call if any problems.  Precautions discussed.   Continue current medications.   Return to clinic 2 months

## 2015-06-28 ENCOUNTER — Telehealth: Payer: Self-pay | Admitting: Orthopaedic Surgery

## 2015-06-28 MED ORDER — OXYCODONE-ACETAMINOPHEN 7.5-325 MG PO TABS: 1.0000 | ORAL_TABLET | ORAL | Status: DC | PRN

## 2015-06-28 NOTE — Telephone Encounter (Signed)
Rx printed

## 2015-07-20 ENCOUNTER — Telehealth: Payer: Self-pay | Admitting: Orthopaedic Surgery

## 2015-07-20 MED ORDER — OXYCODONE-ACETAMINOPHEN 7.5-325 MG PO TABS: 1.0000 | ORAL_TABLET | ORAL | Status: DC | PRN

## 2015-07-20 NOTE — Telephone Encounter (Signed)
Rx done. 

## 2015-07-20 NOTE — Telephone Encounter (Signed)
Call from patient requesting refill on medication: oxyCODONE-acetaminophen (PERCOCET) 7.5-325 MG tablet [id number] quantity 90, 21-day supply.  [phone]

## 2015-07-27 DIAGNOSIS — I10 Essential (primary) hypertension: Secondary | ICD-10-CM | POA: Diagnosis not present

## 2015-07-27 DIAGNOSIS — E782 Mixed hyperlipidemia: Secondary | ICD-10-CM | POA: Diagnosis not present

## 2015-07-27 DIAGNOSIS — E119 Type 2 diabetes mellitus without complications: Secondary | ICD-10-CM | POA: Diagnosis not present

## 2015-08-03 DIAGNOSIS — G894 Chronic pain syndrome: Secondary | ICD-10-CM | POA: Diagnosis not present

## 2015-08-03 DIAGNOSIS — D696 Thrombocytopenia, unspecified: Secondary | ICD-10-CM | POA: Diagnosis not present

## 2015-08-03 DIAGNOSIS — E119 Type 2 diabetes mellitus without complications: Secondary | ICD-10-CM | POA: Diagnosis not present

## 2015-08-03 DIAGNOSIS — I10 Essential (primary) hypertension: Secondary | ICD-10-CM | POA: Diagnosis not present

## 2015-08-04 ENCOUNTER — Encounter: Payer: Self-pay | Admitting: Orthopaedic Surgery

## 2015-08-04 ENCOUNTER — Telehealth: Payer: Self-pay | Admitting: Orthopaedic Surgery

## 2015-08-04 ENCOUNTER — Ambulatory Visit (INDEPENDENT_AMBULATORY_CARE_PROVIDER_SITE_OTHER): Payer: Commercial Managed Care - HMO | Admitting: Orthopaedic Surgery

## 2015-08-04 VITALS — BP 120/73 | HR 61 | Temp 97.7°F | Ht 72.0 in | Wt 215.0 lb

## 2015-08-04 DIAGNOSIS — M25561 Pain in right knee: Secondary | ICD-10-CM

## 2015-08-04 DIAGNOSIS — M25562 Pain in left knee: Secondary | ICD-10-CM | POA: Diagnosis not present

## 2015-08-04 NOTE — Telephone Encounter (Signed)
Rx done. 

## 2015-08-04 NOTE — Progress Notes (Signed)
Patient Chris Lopez, male DOB:1945/10/28, 70 y.o. XLK:[id number]  Chief Complaint  Patient presents with  . Follow-up    Bilateral knee pain left worse than right    HPI  Chris Lopez is a 70 y.o. male who has bilateral knee pain, left far worse than the right.  He has bilateral total knees.  The left knee has had several revisions and needs another one.  He has been putting it off for some time now.  The left knee has diffuse tenderness.  He wears a knee sleeve and uses a cane.  He has no redness or new trauma.  He has more medial knee pain than lateral.  He has no crepitus.  He has been seen at [facility] for the knee and has not been back there in a while.  I have advised that he get a new check up.  HPI  Body mass index is 29.15 kg/(m^2). He is a smoker and has COPD.   He has hypertension well controlled.  He has diabetes well controlled.  Review of Systems  HENT: Negative for congestion.   Respiratory: Positive for shortness of breath. Negative for cough.   Cardiovascular: Negative for chest pain and leg swelling.  Endocrine: Positive for cold intolerance.  Musculoskeletal: Positive for myalgias, back pain, arthralgias and gait problem.  Allergic/Immunologic: Positive for environmental allergies.    Past Medical History  Diagnosis Date  . Hypertension   . Depression   . Anxiety   . COPD (chronic obstructive pulmonary disease) ([place])   . Chronic bronchitis   . BPH (benign prostatic hyperplasia)   . Diabetes mellitus without complication [facility])     Past Surgical History  Procedure Laterality Date  . Left knee replacement    . Left knee revised x 3    . Right knee replacement    . Tarsal tunnel repair    . Carpal tunnel right hand    . Cervical fusion    . Thyroidectomy, partial    . Tonsillectomy    . Multiple knee arthroscopies      Family History  Problem Relation Age of Onset  . Colon cancer Neg Hx     Social History Social History  Substance Use  Topics  . Smoking status: Former Smoker -- 1.00 packs/day for 40 years    Types: Cigarettes    Quit date: 06/29/2010  . Smokeless tobacco: Never Used     Comment: using electronic cigarettes  . Alcohol Use: No    Allergies  Allergen Reactions  . Bee Venom Anaphylaxis    Allergic to bee stings not the kit  . Feldene [Piroxicam] Rash and Dermatitis    Current Outpatient Prescriptions  Medication Sig Dispense Refill  . aspirin 325 MG tablet Take 325 mg by mouth daily.     Marland Kitchen atorvastatin (LIPITOR) 20 MG tablet Take 20 mg by mouth daily. Reported on 06/07/2015    . diclofenac (VOLTAREN) 75 MG EC tablet Take 75 mg by mouth 2 (two) times daily.      Marland Kitchen gabapentin (NEURONTIN) 300 MG capsule Take 300 mg by mouth 3 (three) times daily. Reported on 06/07/2015    . LORazepam (ATIVAN) 1 MG tablet Take 1 mg by mouth at bedtime. Reported on 06/07/2015    . losartan (COZAAR) 100 MG tablet Take 100 mg by mouth daily.    . metFORMIN (GLUCOPHAGE) 500 MG tablet Take 500 mg by mouth 2 (two) times daily. Reported on 06/07/2015    .  metoprolol (LOPRESSOR) 50 MG tablet Take 50 mg by mouth 2 (two) times daily.     . nitroGLYCERIN (NITROSTAT) 0.4 MG SL tablet Place 0.4 mg under the tongue every 5 (five) minutes x 3 doses as needed. For chest pain    . omeprazole (PRILOSEC OTC) 20 MG tablet Take 20 mg by mouth daily. Reported on 06/07/2015    . oxyCODONE-acetaminophen (PERCOCET) 7.5-325 MG tablet Take 1 tablet by mouth every 4 (four) hours as needed for moderate pain or severe pain (Must last 21 days). 90 tablet 0  . Tamsulosin HCl (FLOMAX) 0.4 MG CAPS Take 0.4 mg by mouth daily. Reported on 06/07/2015    . venlafaxine (EFFEXOR-XR) 150 MG 24 hr capsule Take 150 mg by mouth daily.      . diazepam (VALIUM) 5 MG tablet TAKE ONE TABLET BY MOUTH EVERY 6 HOURS AS NEEDED FOR PAIN/SPASMS FOR 10 DAYS 40 tablet 3   No current facility-administered medications for this visit.     Physical Exam  Blood pressure 120/73,  pulse 61, temperature 97.7 F (36.5 C), height 6' (1.829 m), weight 215 lb (97.523 kg).  Constitutional: overall normal hygiene, normal nutrition, well developed, normal grooming, normal body habitus. Assistive device:cane, neoprene sleeve brace  Musculoskeletal: gait and station Limp left, muscle tone and strength are normal, no tremors or atrophy is present.  .  Neurological: coordination overall normal.  Deep tendon reflex/nerve stretch intact.  Sensation normal.  Cranial nerves II-XII intact.   Skin:   Scars of both knees, but otheriwise overall no scars, lesions, ulcers or rashes. No psoriasis.  Psychiatric: Alert and oriented x 3.  Recent memory intact, remote memory unclear.  Normal mood and affect. Well groomed.  Good eye contact.  Cardiovascular: overall no swelling, no varicosities, no edema bilaterally, normal temperatures of the legs and arms, no clubbing, cyanosis and good capillary refill.  Lymphatic: palpation is normal.  The bilateral lower extremity is examined:  Inspection:  Thigh:  Non-tender and no defects  Knee does not have swelling 0 effusion.                        Joint tenderness is present                        Patient is not tender over the medial joint line  Lower Leg:  Has normal appearance and no tenderness or defects  Ankle:  Non-tender and no defects  Foot:  Non-tender and no defects Range of Motion:  Knee:  Range of motion is: 0-110 right, 0-95 left                        Crepitus is not  present  Ankle:  Range of motion is normal. Strength and Tone:  The bilateral lower extremity has normal strength and tone. Stability:  Knee:  The knee is stable.  Ankle:  The ankle is stable.  The patient has been educated about the nature of the problem(s) and counseled on treatment options.  The patient appeared to understand what I have discussed and is in agreement with it.  Encounter Diagnosis  Name Primary?  . Bilateral knee pain Yes     PLAN Call if any problems.  Precautions discussed.  Continue current medications.   Return to clinic 2 months

## 2015-08-09 ENCOUNTER — Telehealth: Payer: Self-pay | Admitting: Orthopaedic Surgery

## 2015-08-09 MED ORDER — OXYCODONE-ACETAMINOPHEN 7.5-325 MG PO TABS: 1.0000 | ORAL_TABLET | ORAL | Status: DC | PRN

## 2015-08-09 NOTE — Telephone Encounter (Signed)
Rx done. 

## 2015-08-09 NOTE — Telephone Encounter (Signed)
Oxycodone-Acetaminophen 7.5/325mg   Qty 90 tablets

## 2015-08-31 ENCOUNTER — Telehealth: Payer: Self-pay | Admitting: Orthopaedic Surgery

## 2015-08-31 MED ORDER — OXYCODONE-ACETAMINOPHEN 7.5-325 MG PO TABS: 1.0000 | ORAL_TABLET | ORAL | Status: DC | PRN

## 2015-08-31 NOTE — Telephone Encounter (Signed)
Patient aware.

## 2015-08-31 NOTE — Telephone Encounter (Signed)
Rx Done . 

## 2015-08-31 NOTE — Telephone Encounter (Signed)
Patient called and requested a refill on Oxycodone-Acetaminophen(Percocet) 7.5-325 mgs.  Qty 90  Sig: Take 1 tablet by mouth every 4 (four) hours as needed for moderate pain or severe pain (Must last 21 days).

## 2015-09-15 DIAGNOSIS — M11252 Other chondrocalcinosis, left hip: Secondary | ICD-10-CM | POA: Diagnosis not present

## 2015-09-15 DIAGNOSIS — F17210 Nicotine dependence, cigarettes, uncomplicated: Secondary | ICD-10-CM | POA: Diagnosis not present

## 2015-09-15 DIAGNOSIS — M25552 Pain in left hip: Secondary | ICD-10-CM | POA: Diagnosis not present

## 2015-09-15 DIAGNOSIS — Z471 Aftercare following joint replacement surgery: Secondary | ICD-10-CM | POA: Diagnosis not present

## 2015-09-15 DIAGNOSIS — Z7982 Long term (current) use of aspirin: Secondary | ICD-10-CM | POA: Diagnosis not present

## 2015-09-15 DIAGNOSIS — Z96652 Presence of left artificial knee joint: Secondary | ICD-10-CM | POA: Diagnosis not present

## 2015-09-15 DIAGNOSIS — M25562 Pain in left knee: Secondary | ICD-10-CM | POA: Diagnosis not present

## 2015-09-22 ENCOUNTER — Telehealth: Payer: Self-pay | Admitting: Orthopaedic Surgery

## 2015-09-22 MED ORDER — OXYCODONE-ACETAMINOPHEN 7.5-325 MG PO TABS: 1.0000 | ORAL_TABLET | ORAL | Status: DC | PRN

## 2015-09-22 NOTE — Telephone Encounter (Signed)
Rx done. 

## 2015-09-22 NOTE — Telephone Encounter (Signed)
Patient called and requested a refill on Oxycodone-Acetaminophen (Percocet) 7.5-325 mgs.  Qty 90  Sig: Take 1 tablet by mouth every 4 (four) hours as needed for moderate pain or severe pain (Must last 21 days).    Patient also wanted you to know that he went to [facility] as you suggested.  He said that Dr. Al Corpus is no longer there.  He saw Dr. Jefferson Fuel instead.  He was told by Dr. Jefferson Fuel that the knees look good but he thinks the pain is coming from his hips.  Xrays were taken and they showed bone spurs on the hips.  On Friday, June 23rd, Chris Lopez is going to have injections in the hips.  He just wanted you to know this.

## 2015-10-04 ENCOUNTER — Ambulatory Visit: Payer: Commercial Managed Care - HMO | Admitting: Orthopaedic Surgery

## 2015-10-07 DIAGNOSIS — M25552 Pain in left hip: Secondary | ICD-10-CM | POA: Diagnosis not present

## 2015-10-12 ENCOUNTER — Ambulatory Visit (INDEPENDENT_AMBULATORY_CARE_PROVIDER_SITE_OTHER): Payer: Commercial Managed Care - HMO | Admitting: Orthopaedic Surgery

## 2015-10-12 ENCOUNTER — Encounter: Payer: Self-pay | Admitting: Orthopaedic Surgery

## 2015-10-12 VITALS — BP 139/84 | HR 61 | Temp 98.2°F | Ht 71.0 in | Wt 213.5 lb

## 2015-10-12 DIAGNOSIS — M25561 Pain in right knee: Secondary | ICD-10-CM | POA: Diagnosis not present

## 2015-10-12 DIAGNOSIS — M25562 Pain in left knee: Secondary | ICD-10-CM | POA: Diagnosis not present

## 2015-10-12 MED ORDER — OXYCODONE-ACETAMINOPHEN 7.5-325 MG PO TABS: 1.0000 | ORAL_TABLET | ORAL | Status: DC | PRN

## 2015-10-12 NOTE — Progress Notes (Signed)
Patient Chris Lopez, male DOB:Apr 12, 1945, 70 y.o. UXN:[id number]  No chief complaint on file.   HPI  Chris Lopez is a 70 y.o. male who has bilateral knee pain.  He has been seen at [facility] for his left knee and he had an injection into the left hip joint this past Friday.  He has no new trauma, no giving way of the knees. He has some swelling of the left knee.    HPI  Body mass index is 29.79 kg/(m^2).  ROS  Review of Systems  HENT: Negative for congestion.   Respiratory: Positive for shortness of breath. Negative for cough.   Cardiovascular: Negative for chest pain and leg swelling.  Endocrine: Positive for cold intolerance.  Musculoskeletal: Positive for myalgias, back pain, arthralgias and gait problem.  Allergic/Immunologic: Positive for environmental allergies.    Past Medical History  Diagnosis Date  . Hypertension   . Depression   . Anxiety   . COPD (chronic obstructive pulmonary disease) ([place])   . Chronic bronchitis   . BPH (benign prostatic hyperplasia)   . Diabetes mellitus without complication [facility])     Past Surgical History  Procedure Laterality Date  . Left knee replacement    . Left knee revised x 3    . Right knee replacement    . Tarsal tunnel repair    . Carpal tunnel right hand    . Cervical fusion    . Thyroidectomy, partial    . Tonsillectomy    . Multiple knee arthroscopies      Family History  Problem Relation Age of Onset  . Colon cancer Neg Hx     Social History Social History  Substance Use Topics  . Smoking status: Former Smoker -- 1.00 packs/day for 40 years    Types: Cigarettes    Quit date: 06/29/2010  . Smokeless tobacco: Never Used     Comment: using electronic cigarettes  . Alcohol Use: No    Allergies  Allergen Reactions  . Bee Venom Anaphylaxis    Allergic to bee stings not the kit  . Feldene [Piroxicam] Rash and Dermatitis    Current Outpatient Prescriptions  Medication Sig Dispense Refill  .  aspirin 325 MG tablet Take 325 mg by mouth daily.     Marland Kitchen atorvastatin (LIPITOR) 20 MG tablet Take 20 mg by mouth daily. Reported on 06/07/2015    . diazepam (VALIUM) 5 MG tablet TAKE ONE TABLET BY MOUTH EVERY 6 HOURS AS NEEDED FOR PAIN/SPASMS FOR 10 DAYS 40 tablet 3  . diclofenac (VOLTAREN) 75 MG EC tablet Take 75 mg by mouth 2 (two) times daily.      Marland Kitchen gabapentin (NEURONTIN) 300 MG capsule Take 300 mg by mouth 3 (three) times daily. Reported on 06/07/2015    . LORazepam (ATIVAN) 1 MG tablet Take 1 mg by mouth at bedtime. Reported on 06/07/2015    . losartan (COZAAR) 100 MG tablet Take 100 mg by mouth daily.    . metFORMIN (GLUCOPHAGE) 500 MG tablet Take 500 mg by mouth 2 (two) times daily. Reported on 06/07/2015    . metoprolol (LOPRESSOR) 50 MG tablet Take 50 mg by mouth 2 (two) times daily.     . nitroGLYCERIN (NITROSTAT) 0.4 MG SL tablet Place 0.4 mg under the tongue every 5 (five) minutes x 3 doses as needed. For chest pain    . omeprazole (PRILOSEC OTC) 20 MG tablet Take 20 mg by mouth daily. Reported on 06/07/2015    .  oxyCODONE-acetaminophen (PERCOCET) 7.5-325 MG tablet Take 1 tablet by mouth every 4 (four) hours as needed for moderate pain or severe pain (Must last 21 days). 90 tablet 0  . Tamsulosin HCl (FLOMAX) 0.4 MG CAPS Take 0.4 mg by mouth daily. Reported on 06/07/2015    . venlafaxine (EFFEXOR-XR) 150 MG 24 hr capsule Take 150 mg by mouth daily.       No current facility-administered medications for this visit.     Physical Exam  Blood pressure 139/84, pulse 61, temperature 98.2 F (36.8 C), height 5' 11"  (1.803 m), weight 213 lb 8 oz (96.843 kg).  Constitutional: overall normal hygiene, normal nutrition, well developed, normal grooming, normal body habitus. Assistive device:cane  Musculoskeletal: gait and station Limp left, muscle tone and strength are normal, no tremors or atrophy is present.  .  Neurological: coordination overall normal.  Deep tendon reflex/nerve stretch  intact.  Sensation normal.  Cranial nerves II-XII intact.   Skin:   Bilateral scars both knees; otherwise overall no scars, lesions, ulcers or rashes. No psoriasis.  Psychiatric: Alert and oriented x 3.  Recent memory intact, remote memory unclear.  Normal mood and affect. Well groomed.  Good eye contact.  Cardiovascular: overall no swelling, no varicosities, no edema bilaterally, normal temperatures of the legs and arms, no clubbing, cyanosis and good capillary refill.  Lymphatic: palpation is normal.  The bilateral lower extremity is examined:  Inspection:  Thigh:  Non-tender and no defects  Knee has swelling 1+ effusion of the left knee                        Joint tenderness is not present                        Patient is not tender over the medial joint line  Lower Leg:  Has normal appearance and no tenderness or defects  Ankle:  Non-tender and no defects  Foot:  Non-tender and no defects Range of Motion:  Knee:  Range of motion is: 0 to 105 right; 0 to 100 left                        Crepitus is  present  Ankle:  Range of motion is normal. Strength and Tone:  The bilateral lower extremity has normal strength and tone. Stability:  Knee:  The knee is stable.  Ankle:  The ankle is stable.    The patient has been educated about the nature of the problem(s) and counseled on treatment options.  The patient appeared to understand what I have discussed and is in agreement with it.  Encounter Diagnosis  Name Primary?  . Bilateral knee pain Yes    PLAN Call if any problems.  Precautions discussed.  Continue current medications.   Return to clinic 2 months   Electronically Signed Sanjuana Kava, MD 7/5/201710:55 AM

## 2015-10-23 ENCOUNTER — Telehealth: Payer: Self-pay | Admitting: Orthopaedic Surgery

## 2015-10-25 NOTE — Telephone Encounter (Signed)
Rx done. 

## 2015-11-07 ENCOUNTER — Telehealth: Payer: Self-pay | Admitting: Orthopaedic Surgery

## 2015-11-07 MED ORDER — OXYCODONE-ACETAMINOPHEN 7.5-325 MG PO TABS
1.0000 | ORAL_TABLET | ORAL | 0 refills | Status: DC | PRN
Start: 2015-11-07 — End: 2015-11-30

## 2015-11-07 NOTE — Telephone Encounter (Signed)
Patient called and requested a refill on Oxycodone/Acetaminophen (Percocet) 7.5-325 mgs.  Qty  90  Sig: Take 1 tablet by mouth every 4 (four) hours as needed for moderate pain or severe pain (Must last 21 days).

## 2015-11-30 ENCOUNTER — Telehealth: Payer: Self-pay | Admitting: Orthopaedic Surgery

## 2015-11-30 MED ORDER — OXYCODONE-ACETAMINOPHEN 7.5-325 MG PO TABS: 1.0000 | ORAL_TABLET | ORAL | 0 refills | Status: DC | PRN

## 2015-11-30 NOTE — Telephone Encounter (Signed)
Oxycodone-Acetaminophen 7.5/325mg   Qty 90 Tablets

## 2015-12-14 ENCOUNTER — Encounter: Payer: Self-pay | Admitting: Orthopaedic Surgery

## 2015-12-14 ENCOUNTER — Ambulatory Visit (INDEPENDENT_AMBULATORY_CARE_PROVIDER_SITE_OTHER): Payer: Commercial Managed Care - HMO | Admitting: Orthopaedic Surgery

## 2015-12-14 VITALS — BP 111/68 | HR 64 | Temp 98.1°F | Ht 70.0 in | Wt 214.0 lb

## 2015-12-14 DIAGNOSIS — Z720 Tobacco use: Secondary | ICD-10-CM | POA: Diagnosis not present

## 2015-12-14 DIAGNOSIS — M25562 Pain in left knee: Secondary | ICD-10-CM

## 2015-12-14 DIAGNOSIS — M25561 Pain in right knee: Secondary | ICD-10-CM | POA: Diagnosis not present

## 2015-12-14 DIAGNOSIS — F17200 Nicotine dependence, unspecified, uncomplicated: Secondary | ICD-10-CM

## 2015-12-14 NOTE — Patient Instructions (Signed)
You Can Quit Smoking If you are ready to quit smoking or are thinking about it, congratulations! You have chosen to help yourself be healthier and live longer! There are lots of different ways to quit smoking. Nicotine gum, nicotine patches, a nicotine inhaler, or nicotine nasal spray can help with physical craving. Hypnosis, support groups, and medicines help break the habit of smoking. TIPS TO GET OFF AND STAY OFF CIGARETTES  Learn to predict your moods. Do not let a bad situation be your excuse to have a cigarette. Some situations in your life might tempt you to have a cigarette.  Ask friends and co-workers not to smoke around you.  Make your home smoke-free.  Never have "just one" cigarette. It leads to wanting another and another. Remind yourself of your decision to quit.  On a card, make a list of your reasons for not smoking. Read it at least the same number of times a day as you have a cigarette. Tell yourself everyday, "I do not want to smoke. I choose not to smoke."  Ask someone at home or work to help you with your plan to quit smoking.  Have something planned after you eat or have a cup of coffee. Take a walk or get other exercise to perk you up. This will help to keep you from overeating.  Try a relaxation exercise to calm you down and decrease your stress. Remember, you may be tense and nervous the first two weeks after you quit. This will pass.  Find new activities to keep your hands busy. Play with a pen, coin, or rubber band. Doodle or draw things on paper.  Brush your teeth right after eating. This will help cut down the craving for the taste of tobacco after meals. You can try mouthwash too.  Try gum, breath mints, or diet candy to keep something in your mouth. IF YOU SMOKE AND WANT TO QUIT:  Do not stock up on cigarettes. Never buy a carton. Wait until one pack is finished before you buy another.  Never carry cigarettes with you at work or at home.  Keep cigarettes  as far away from you as possible. Leave them with someone else.  Never carry matches or a lighter with you.  Ask yourself, "Do I need this cigarette or is this just a reflex?"  Bet with someone that you can quit. Put cigarette money in a piggy bank every morning. If you smoke, you give up the money. If you do not smoke, by the end of the week, you keep the money.  Keep trying. It takes 21 days to change a habit!  Talk to your doctor about using medicines to help you quit. These include nicotine replacement gum, lozenges, or skin patches.   This information is not intended to replace advice given to you by your health care provider. Make sure you discuss any questions you have with your health care provider.   Document Released: 01/20/2009 Document Revised: 06/18/2011 Document Reviewed: 01/20/2009 Elsevier Interactive Patient Education 2016 Elsevier Inc[employer]  Generic Knee Exercises EXERCISES RANGE OF MOTION (ROM) AND STRETCHING EXERCISES These exercises may help you when beginning to rehabilitate your injury. Your symptoms may resolve with or without further involvement from your physician, physical therapist, or athletic trainer. While completing these exercises, remember:   Restoring tissue flexibility helps normal motion to return to the joints. This allows healthier, less painful movement and activity.  An effective stretch should be held for at least 30 seconds.  A stretch should never be painful. You should only feel a gentle lengthening or release in the stretched tissue. STRETCH - Knee Extension, Prone  Lie on your stomach on a firm surface, such as a bed or countertop. Place your right / left knee and leg just beyond the edge of the surface. You may wish to place a towel under the far end of your right / left thigh for comfort.  Relax your leg muscles and allow gravity to straighten your knee. Your clinician may advise you to add an ankle weight if more resistance is helpful for  you.  You should feel a stretch in the back of your right / left knee. Hold this position for __________ seconds. Repeat __________ times. Complete this stretch __________ times per day. * Your physician, physical therapist, or athletic trainer may ask you to add ankle weight to enhance your stretch.  RANGE OF MOTION - Knee Flexion, Active  Lie on your back with both knees straight. (If this causes back discomfort, bend your opposite knee, placing your foot flat on the floor.)  Slowly slide your heel back toward your buttocks until you feel a gentle stretch in the front of your knee or thigh.  Hold for __________ seconds. Slowly slide your heel back to the starting position. Repeat __________ times. Complete this exercise __________ times per day.  STRETCH - Quadriceps, Prone   Lie on your stomach on a firm surface, such as a bed or padded floor.  Bend your right / left knee and grasp your ankle. If you are unable to reach your ankle or pant leg, use a belt around your foot to lengthen your reach.  Gently pull your heel toward your buttocks. Your knee should not slide out to the side. You should feel a stretch in the front of your thigh and/or knee.  Hold this position for __________ seconds. Repeat __________ times. Complete this stretch __________ times per day.  STRETCH - Hamstrings, Supine   Lie on your back. Loop a belt or towel over the ball of your right / left foot.  Straighten your right / left knee and slowly pull on the belt to raise your leg. Do not allow the right / left knee to bend. Keep your opposite leg flat on the floor.  Raise the leg until you feel a gentle stretch behind your right / left knee or thigh. Hold this position for __________ seconds. Repeat __________ times. Complete this stretch __________ times per day.  STRENGTHENING EXERCISES These exercises may help you when beginning to rehabilitate your injury. They may resolve your symptoms with or without  further involvement from your physician, physical therapist, or athletic trainer. While completing these exercises, remember:   Muscles can gain both the endurance and the strength needed for everyday activities through controlled exercises.  Complete these exercises as instructed by your physician, physical therapist, or athletic trainer. Progress the resistance and repetitions only as guided.  You may experience muscle soreness or fatigue, but the pain or discomfort you are trying to eliminate should never worsen during these exercises. If this pain does worsen, stop and make certain you are following the directions exactly. If the pain is still present after adjustments, discontinue the exercise until you can discuss the trouble with your clinician. STRENGTH - Quadriceps, Isometrics  Lie on your back with your right / left leg extended and your opposite knee bent.  Gradually tense the muscles in the front of your right / left thigh.  You should see either your knee cap slide up toward your hip or increased dimpling just above the knee. This motion will push the back of the knee down toward the floor/mat/bed on which you are lying.  Hold the muscle as tight as you can without increasing your pain for __________ seconds.  Relax the muscles slowly and completely in between each repetition. Repeat __________ times. Complete this exercise __________ times per day.  STRENGTH - Quadriceps, Short Arcs   Lie on your back. Place a __________ inch towel roll under your knee so that the knee slightly bends.  Raise only your lower leg by tightening the muscles in the front of your thigh. Do not allow your thigh to rise.  Hold this position for __________ seconds. Repeat __________ times. Complete this exercise __________ times per day.  OPTIONAL ANKLE WEIGHTS: Begin with ____________________, but DO NOT exceed ____________________. Increase in 1 pound/0.5 kilogram increments.  STRENGTH - Quadriceps,  Straight Leg Raises  Quality counts! Watch for signs that the quadriceps muscle is working to insure you are strengthening the correct muscles and not "cheating" by substituting with healthier muscles.  Lay on your back with your right / left leg extended and your opposite knee bent.  Tense the muscles in the front of your right / left thigh. You should see either your knee cap slide up or increased dimpling just above the knee. Your thigh may even quiver.  Tighten these muscles even more and raise your leg 4 to 6 inches off the floor. Hold for __________ seconds.  Keeping these muscles tense, lower your leg.  Relax the muscles slowly and completely in between each repetition. Repeat __________ times. Complete this exercise __________ times per day.  STRENGTH - Hamstring, Curls  Lay on your stomach with your legs extended. (If you lay on a bed, your feet may hang over the edge.)  Tighten the muscles in the back of your thigh to bend your right / left knee up to 90 degrees. Keep your hips flat on the bed/floor.  Hold this position for __________ seconds.  Slowly lower your leg back to the starting position. Repeat __________ times. Complete this exercise __________ times per day.  OPTIONAL ANKLE WEIGHTS: Begin with ____________________, but DO NOT exceed ____________________. Increase in 1 pound/0.5 kilogram increments.  STRENGTH - Quadriceps, Squats  Stand in a door frame so that your feet and knees are in line with the frame.  Use your hands for balance, not support, on the frame.  Slowly lower your weight, bending at the hips and knees. Keep your lower legs upright so that they are parallel with the door frame. Squat only within the range that does not increase your knee pain. Never let your hips drop below your knees.  Slowly return upright, pushing with your legs, not pulling with your hands. Repeat __________ times. Complete this exercise __________ times per day.  STRENGTH -  Quadriceps, Wall Slides  Follow guidelines for form closely. Increased knee pain often results from poorly placed feet or knees.  Lean against a smooth wall or door and walk your feet out 18-24 inches. Place your feet hip-width apart.  Slowly slide down the wall or door until your knees bend __________ degrees.* Keep your knees over your heels, not your toes, and in line with your hips, not falling to either side.  Hold for __________ seconds. Stand up to rest for __________ seconds in between each repetition. Repeat __________ times. Complete this exercise __________ times  per day. * Your physician, physical therapist, or athletic trainer will alter this angle based on your symptoms and progress.   This information is not intended to replace advice given to you by your health care provider. Make sure you discuss any questions you have with your health care provider.   Document Released: 02/07/2005 Document Revised: 04/16/2014 Document Reviewed: 07/08/2008 Elsevier Interactive Patient Education [employer].

## 2015-12-14 NOTE — Progress Notes (Signed)
Patient Chris Lopez, male DOB:09-28-1945, 70 y.o. GGY:[id number]  Chief Complaint  Patient presents with  . Follow-up    Left knee    HPI  Chris Lopez is a 70 y.o. male who has chronic bilateral knee pain.  The left knee hurts more than the right.  He is stable.  He has bilateral total knees.  He has been evaluated at [facility] about another revision of the left knee and they said to wait for now.  He has no new trauma.  He uses his cane and takes his medicine. HPI  Body mass index is 30.71 kg/m.  ROS  Review of Systems  HENT: Negative for congestion.   Respiratory: Positive for shortness of breath. Negative for cough.   Cardiovascular: Negative for chest pain and leg swelling.  Endocrine: Positive for cold intolerance.  Musculoskeletal: Positive for arthralgias, back pain, gait problem and myalgias.  Allergic/Immunologic: Positive for environmental allergies.    Past Medical History:  Diagnosis Date  . Anxiety   . BPH (benign prostatic hyperplasia)   . Chronic bronchitis   . COPD (chronic obstructive pulmonary disease) ([place])   . Depression   . Diabetes mellitus without complication ([place])   . Hypertension     Past Surgical History:  Procedure Laterality Date  . carpal tunnel right hand    . CERVICAL FUSION    . left knee replacement    . left knee revised X 3    . multiple knee arthroscopies    . right knee replacement    . tarsal tunnel repair    . THYROIDECTOMY, PARTIAL    . TONSILLECTOMY      Family History  Problem Relation Age of Onset  . Colon cancer Neg Hx     Social History Social History  Substance Use Topics  . Smoking status: Former Smoker    Packs/day: 1.00    Years: 40.00    Types: Cigarettes    Quit date: 06/29/2010  . Smokeless tobacco: Never Used     Comment: using electronic cigarettes  . Alcohol use No    Allergies  Allergen Reactions  . Bee Venom Anaphylaxis    Allergic to bee stings not the kit  . Feldene  [Piroxicam] Rash and Dermatitis    Current Outpatient Prescriptions  Medication Sig Dispense Refill  . aspirin 325 MG tablet Take 325 mg by mouth daily.     Marland Kitchen atorvastatin (LIPITOR) 20 MG tablet Take 20 mg by mouth daily. Reported on 06/07/2015    . diazepam (VALIUM) 5 MG tablet TAKE ONE TABLET BY MOUTH EVERY 6 HOURS AS NEEDED FOR PAIN/SPASMS FOR 10 DAYS 40 tablet 3  . diclofenac (VOLTAREN) 75 MG EC tablet Take 75 mg by mouth 2 (two) times daily.      Marland Kitchen gabapentin (NEURONTIN) 300 MG capsule Take 300 mg by mouth 3 (three) times daily. Reported on 06/07/2015    . LORazepam (ATIVAN) 1 MG tablet Take 1 mg by mouth at bedtime. Reported on 06/07/2015    . losartan (COZAAR) 100 MG tablet Take 100 mg by mouth daily.    . metFORMIN (GLUCOPHAGE) 500 MG tablet Take 500 mg by mouth 2 (two) times daily. Reported on 06/07/2015    . metoprolol (LOPRESSOR) 50 MG tablet Take 50 mg by mouth 2 (two) times daily.     . nitroGLYCERIN (NITROSTAT) 0.4 MG SL tablet Place 0.4 mg under the tongue every 5 (five) minutes x 3 doses as needed. For chest  pain    . omeprazole (PRILOSEC OTC) 20 MG tablet Take 20 mg by mouth daily. Reported on 06/07/2015    . oxyCODONE-acetaminophen (PERCOCET) 7.5-325 MG tablet Take 1 tablet by mouth every 4 (four) hours as needed for moderate pain or severe pain (Must last 21 days). 90 tablet 0  . Tamsulosin HCl (FLOMAX) 0.4 MG CAPS Take 0.4 mg by mouth daily. Reported on 06/07/2015    . venlafaxine (EFFEXOR-XR) 150 MG 24 hr capsule Take 150 mg by mouth daily.       No current facility-administered medications for this visit.      Physical Exam  Blood pressure 111/68, pulse 64, temperature 98.1 F (36.7 C), height 5' 10"  (1.778 m), weight 214 lb (97.1 kg).  Constitutional: overall normal hygiene, normal nutrition, well developed, normal grooming, normal body habitus. Assistive device:cane  Musculoskeletal: gait and station Limp left, muscle tone and strength are normal, no tremors or  atrophy is present.  .  Neurological: coordination overall normal.  Deep tendon reflex/nerve stretch intact.  Sensation normal.  Cranial nerves II-XII intact.   Skin:   Normal overall no scars, lesions, ulcers or rashes. No psoriasis.  Psychiatric: Alert and oriented x 3.  Recent memory intact, remote memory unclear.  Normal mood and affect. Well groomed.  Good eye contact.  Cardiovascular: overall no swelling, no varicosities, no edema bilaterally, normal temperatures of the legs and arms, no clubbing, cyanosis and good capillary refill.  Lymphatic: palpation is normal.  The bilateral lower extremity is examined:  Inspection:  Thigh:  Non-tender and no defects  Knee has swelling 1+ effusion.                        Joint tenderness is not present                        Patient is not tender over the medial joint line  Lower Leg:  Has normal appearance and no tenderness or defects  Ankle:  Non-tender and no defects  Foot:  Non-tender and no defects Range of Motion:  Knee:  Range of motion is: 0-105 right, 0 to 100 left                        Crepitus is  present  Ankle:  Range of motion is normal. Strength and Tone:  The bilateral lower extremity has normal strength and tone. Stability:  Knee:  The knee is stable.  Ankle:  The ankle is stable.    The patient has been educated about the nature of the problem(s) and counseled on treatment options.  The patient appeared to understand what I have discussed and is in agreement with it.  Encounter Diagnoses  Name Primary?  . Bilateral knee pain Yes  . Tobacco smoker within last 12 months     PLAN Call if any problems.  Precautions discussed.  Continue current medications.   Return to clinic 6 weeks   Electronically Signed Sanjuana Kava, MD 9/6/201710:35 AM

## 2015-12-22 ENCOUNTER — Telehealth: Payer: Self-pay | Admitting: Orthopaedic Surgery

## 2015-12-22 MED ORDER — OXYCODONE-ACETAMINOPHEN 7.5-325 MG PO TABS: 1.0000 | ORAL_TABLET | ORAL | 0 refills | Status: DC | PRN

## 2015-12-22 NOTE — Telephone Encounter (Signed)
Oxycodone-Acetaminophen  7.5/325mg   Qty 90 Tablets

## 2016-01-12 ENCOUNTER — Other Ambulatory Visit: Payer: Self-pay | Admitting: Orthopaedic Surgery

## 2016-01-17 ENCOUNTER — Telehealth: Payer: Self-pay | Admitting: Orthopaedic Surgery

## 2016-01-17 MED ORDER — OXYCODONE-ACETAMINOPHEN 7.5-325 MG PO TABS: 1.0000 | ORAL_TABLET | ORAL | 0 refills | Status: DC | PRN

## 2016-01-17 NOTE — Telephone Encounter (Signed)
Patient called for refill: oxyCODONE-acetaminophen (PERCOCET) 7.5-325 - 21-day supply.   Insurance is [employer]

## 2016-01-25 ENCOUNTER — Ambulatory Visit: Payer: Commercial Managed Care - HMO | Admitting: Orthopaedic Surgery

## 2016-01-26 ENCOUNTER — Encounter: Payer: Self-pay | Admitting: Orthopaedic Surgery

## 2016-01-26 ENCOUNTER — Ambulatory Visit (INDEPENDENT_AMBULATORY_CARE_PROVIDER_SITE_OTHER): Payer: Commercial Managed Care - HMO | Admitting: Orthopaedic Surgery

## 2016-01-26 VITALS — BP 106/67 | HR 68 | Temp 97.9°F | Ht 70.0 in | Wt 210.0 lb

## 2016-01-26 DIAGNOSIS — G8929 Other chronic pain: Secondary | ICD-10-CM

## 2016-01-26 DIAGNOSIS — M25561 Pain in right knee: Secondary | ICD-10-CM | POA: Diagnosis not present

## 2016-01-26 DIAGNOSIS — M25562 Pain in left knee: Secondary | ICD-10-CM | POA: Diagnosis not present

## 2016-01-26 NOTE — Progress Notes (Signed)
Patient ID:Chris Lopez, male DOB:September 08, 1945, 70 y.o. MPN:[id number]  Chief Complaint  Patient presents with  . Follow-up    BILATERAL KNEE PAIN    HPI  Chris Lopez is a 70 y.o. male who has chronic pain of both knees, more on the left. He has bilateral total knees.  He uses a knee sleeve on the left.  He has no new trauma.  He has no swelling or redness. HPI  Body mass index is 30.13 kg/m.  ROS  Review of Systems  HENT: Negative for congestion.   Respiratory: Positive for shortness of breath. Negative for cough.   Cardiovascular: Negative for chest pain and leg swelling.  Endocrine: Positive for cold intolerance.  Musculoskeletal: Positive for arthralgias, back pain, gait problem and myalgias.  Allergic/Immunologic: Positive for environmental allergies.    Past Medical History:  Diagnosis Date  . Anxiety   . BPH (benign prostatic hyperplasia)   . Chronic bronchitis   . COPD (chronic obstructive pulmonary disease) ([place])   . Depression   . Diabetes mellitus without complication ([place])   . Hypertension     Past Surgical History:  Procedure Laterality Date  . carpal tunnel right hand    . CERVICAL FUSION    . left knee replacement    . left knee revised X 3    . multiple knee arthroscopies    . right knee replacement    . tarsal tunnel repair    . THYROIDECTOMY, PARTIAL    . TONSILLECTOMY      Family History  Problem Relation Age of Onset  . Colon cancer Neg Hx     Social History Social History  Substance Use Topics  . Smoking status: Former Smoker    Packs/day: 1.00    Years: 40.00    Types: Cigarettes    Quit date: 06/29/2010  . Smokeless tobacco: Never Used     Comment: using electronic cigarettes  . Alcohol use No    Allergies  Allergen Reactions  . Bee Venom Anaphylaxis    Allergic to bee stings not the kit  . Feldene [Piroxicam] Rash and Dermatitis    Current Outpatient Prescriptions  Medication Sig Dispense Refill  . aspirin 325 MG  tablet Take 325 mg by mouth daily.     Marland Kitchen atorvastatin (LIPITOR) 20 MG tablet Take 20 mg by mouth daily. Reported on 06/07/2015    . diazepam (VALIUM) 5 MG tablet Take 1 tablet (5 mg total) by mouth every 12 (twelve) hours as needed for anxiety. 40 tablet 1  . diclofenac (VOLTAREN) 75 MG EC tablet Take 75 mg by mouth 2 (two) times daily.      Marland Kitchen gabapentin (NEURONTIN) 300 MG capsule Take 300 mg by mouth 3 (three) times daily. Reported on 06/07/2015    . LORazepam (ATIVAN) 1 MG tablet Take 1 mg by mouth at bedtime. Reported on 06/07/2015    . losartan (COZAAR) 100 MG tablet Take 100 mg by mouth daily.    . metFORMIN (GLUCOPHAGE) 500 MG tablet Take 500 mg by mouth 2 (two) times daily. Reported on 06/07/2015    . metoprolol (LOPRESSOR) 50 MG tablet Take 50 mg by mouth 2 (two) times daily.     . nitroGLYCERIN (NITROSTAT) 0.4 MG SL tablet Place 0.4 mg under the tongue every 5 (five) minutes x 3 doses as needed. For chest pain    . omeprazole (PRILOSEC OTC) 20 MG tablet Take 20 mg by mouth daily. Reported on 06/07/2015    .  oxyCODONE-acetaminophen (PERCOCET) 7.5-325 MG tablet Take 1 tablet by mouth every 4 (four) hours as needed for moderate pain or severe pain (Must last 21 days). 90 tablet 0  . Tamsulosin HCl (FLOMAX) 0.4 MG CAPS Take 0.4 mg by mouth daily. Reported on 06/07/2015    . venlafaxine (EFFEXOR-XR) 150 MG 24 hr capsule Take 150 mg by mouth daily.       No current facility-administered medications for this visit.      Physical Exam  Blood pressure 106/67, pulse 68, temperature 97.9 F (36.6 C), height 5' 10"  (1.778 m), weight 210 lb (95.3 kg).  Constitutional: overall normal hygiene, normal nutrition, well developed, normal grooming, normal body habitus. Assistive device:cane  Musculoskeletal: gait and station Limp left, muscle tone and strength are normal, no tremors or atrophy is present.  .  Neurological: coordination overall normal.  Deep tendon reflex/nerve stretch intact.  Sensation  normal.  Cranial nerves II-XII intact.   Skin:   Normal overall he has knee scars, no lesions, ulcers or rashes. No psoriasis.  Psychiatric: Alert and oriented x 3.  Recent memory intact, remote memory unclear.  Normal mood and affect. Well groomed.  Good eye contact.  Cardiovascular: overall no swelling, no varicosities, no edema bilaterally, normal temperatures of the legs and arms, no clubbing, cyanosis and good capillary refill.  Lymphatic: palpation is normal.  Both knees have well healed scars. ROM of the right is 0 - 110 and left is 0 - 100.  He has some crepitus on the left.  NV intact.  Limp to the left.   The patient has been educated about the nature of the problem(s) and counseled on treatment options.  The patient appeared to understand what I have discussed and is in agreement with it.  Encounter Diagnosis  Name Primary?  . Chronic pain of both knees Yes    PLAN Call if any problems.  Precautions discussed.  Continue current medications.   Return to clinic 2 months   Electronically Signed Sanjuana Kava, MD 10/19/201710:25 AM

## 2016-01-26 NOTE — Patient Instructions (Signed)
Smoking Cessation, Tips for Success If you are ready to quit smoking, congratulations! You have chosen to help yourself be healthier. Cigarettes bring nicotine, tar, carbon monoxide, and other irritants into your body. Your lungs, heart, and blood vessels will be able to work better without these poisons. There are many different ways to quit smoking. Nicotine gum, nicotine patches, a nicotine inhaler, or nicotine nasal spray can help with physical craving. Hypnosis, support groups, and medicines help break the habit of smoking. WHAT THINGS CAN I DO TO MAKE QUITTING EASIER?  Here are some tips to help you quit for good:  Pick a date when you will quit smoking completely. Tell all of your friends and family about your plan to quit on that date.  Do not try to slowly cut down on the number of cigarettes you are smoking. Pick a quit date and quit smoking completely starting on that day.  Throw away all cigarettes.   Clean and remove all ashtrays from your home, work, and car.  On a card, write down your reasons for quitting. Carry the card with you and read it when you get the urge to smoke.  Cleanse your body of nicotine. Drink enough water and fluids to keep your urine clear or pale yellow. Do this after quitting to flush the nicotine from your body.  Learn to predict your moods. Do not let a bad situation be your excuse to have a cigarette. Some situations in your life might tempt you into wanting a cigarette.  Never have "just one" cigarette. It leads to wanting another and another. Remind yourself of your decision to quit.  Change habits associated with smoking. If you smoked while driving or when feeling stressed, try other activities to replace smoking. Stand up when drinking your coffee. Brush your teeth after eating. Sit in a different chair when you read the paper. Avoid alcohol while trying to quit, and try to drink fewer caffeinated beverages. Alcohol and caffeine may urge you to  smoke.  Avoid foods and drinks that can trigger a desire to smoke, such as sugary or spicy foods and alcohol.  Ask people who smoke not to smoke around you.  Have something planned to do right after eating or having a cup of coffee. For example, plan to take a walk or exercise.  Try a relaxation exercise to calm you down and decrease your stress. Remember, you may be tense and nervous for the first 2 weeks after you quit, but this will pass.  Find new activities to keep your hands busy. Play with a pen, coin, or rubber band. Doodle or draw things on paper.  Brush your teeth right after eating. This will help cut down on the craving for the taste of tobacco after meals. You can also try mouthwash.   Use oral substitutes in place of cigarettes. Try using lemon drops, carrots, cinnamon sticks, or chewing gum. Keep them handy so they are available when you have the urge to smoke.  When you have the urge to smoke, try deep breathing.  Designate your home as a nonsmoking area.  If you are a heavy smoker, ask your health care provider about a prescription for nicotine chewing gum. It can ease your withdrawal from nicotine.  Reward yourself. Set aside the cigarette money you save and buy yourself something nice.  Look for support from others. Join a support group or smoking cessation program. Ask someone at home or at work to help you with your plan   to quit smoking.  Always ask yourself, "Do I need this cigarette or is this just a reflex?" Tell yourself, "Today, I choose not to smoke," or "I do not want to smoke." You are reminding yourself of your decision to quit.  Do not replace cigarette smoking with electronic cigarettes (commonly called e-cigarettes). The safety of e-cigarettes is unknown, and some may contain harmful chemicals.  If you relapse, do not give up! Plan ahead and think about what you will do the next time you get the urge to smoke. HOW WILL I FEEL WHEN I QUIT SMOKING? You  may have symptoms of withdrawal because your body is used to nicotine (the addictive substance in cigarettes). You may crave cigarettes, be irritable, feel very hungry, cough often, get headaches, or have difficulty concentrating. The withdrawal symptoms are only temporary. They are strongest when you first quit but will go away within 10-14 days. When withdrawal symptoms occur, stay in control. Think about your reasons for quitting. Remind yourself that these are signs that your body is healing and getting used to being without cigarettes. Remember that withdrawal symptoms are easier to treat than the major diseases that smoking can cause.  Even after the withdrawal is over, expect periodic urges to smoke. However, these cravings are generally short lived and will go away whether you smoke or not. Do not smoke! WHAT RESOURCES ARE AVAILABLE TO HELP ME QUIT SMOKING? Your health care provider can direct you to community resources or hospitals for support, which may include:  Group support.  Education.  Hypnosis.  Therapy.   This information is not intended to replace advice given to you by your health care provider. Make sure you discuss any questions you have with your health care provider.   Document Released: 12/23/2003 Document Revised: 04/16/2014 Document Reviewed: 09/11/2012 Elsevier Interactive Patient Education 2016 [employer]  

## 2016-01-30 ENCOUNTER — Telehealth: Payer: Self-pay | Admitting: Orthopaedic Surgery

## 2016-02-01 DIAGNOSIS — E119 Type 2 diabetes mellitus without complications: Secondary | ICD-10-CM | POA: Diagnosis not present

## 2016-02-01 DIAGNOSIS — D509 Iron deficiency anemia, unspecified: Secondary | ICD-10-CM | POA: Diagnosis not present

## 2016-02-01 DIAGNOSIS — I10 Essential (primary) hypertension: Secondary | ICD-10-CM | POA: Diagnosis not present

## 2016-02-09 ENCOUNTER — Telehealth: Payer: Self-pay | Admitting: Orthopaedic Surgery

## 2016-02-09 NOTE — Telephone Encounter (Signed)
Oxycodone-Acetaminophen 7.5/325mg   Qty 90 Tablets

## 2016-02-13 MED ORDER — OXYCODONE-ACETAMINOPHEN 7.5-325 MG PO TABS: 1.0000 | ORAL_TABLET | ORAL | 0 refills | Status: DC | PRN

## 2016-02-24 DIAGNOSIS — Z6830 Body mass index (BMI) 30.0-30.9, adult: Secondary | ICD-10-CM | POA: Diagnosis not present

## 2016-02-24 DIAGNOSIS — L03317 Cellulitis of buttock: Secondary | ICD-10-CM | POA: Diagnosis not present

## 2016-02-24 DIAGNOSIS — L89153 Pressure ulcer of sacral region, stage 3: Secondary | ICD-10-CM | POA: Diagnosis not present

## 2016-02-28 DIAGNOSIS — L89153 Pressure ulcer of sacral region, stage 3: Secondary | ICD-10-CM | POA: Diagnosis not present

## 2016-02-28 DIAGNOSIS — Z6830 Body mass index (BMI) 30.0-30.9, adult: Secondary | ICD-10-CM | POA: Diagnosis not present

## 2016-02-28 DIAGNOSIS — L03317 Cellulitis of buttock: Secondary | ICD-10-CM | POA: Diagnosis not present

## 2016-02-29 ENCOUNTER — Telehealth: Payer: Self-pay | Admitting: Orthopaedic Surgery

## 2016-03-06 DIAGNOSIS — L89153 Pressure ulcer of sacral region, stage 3: Secondary | ICD-10-CM | POA: Diagnosis not present

## 2016-03-06 DIAGNOSIS — Z23 Encounter for immunization: Secondary | ICD-10-CM | POA: Diagnosis not present

## 2016-03-07 ENCOUNTER — Other Ambulatory Visit: Payer: Self-pay | Admitting: Orthopaedic Surgery

## 2016-03-07 ENCOUNTER — Encounter: Payer: Self-pay | Admitting: Orthopaedic Surgery

## 2016-03-07 ENCOUNTER — Telehealth: Payer: Self-pay | Admitting: Orthopaedic Surgery

## 2016-03-08 MED ORDER — OXYCODONE-ACETAMINOPHEN 7.5-325 MG PO TABS: 1.0000 | ORAL_TABLET | ORAL | 0 refills | Status: DC | PRN

## 2016-03-22 DIAGNOSIS — L89153 Pressure ulcer of sacral region, stage 3: Secondary | ICD-10-CM | POA: Diagnosis not present

## 2016-03-27 ENCOUNTER — Ambulatory Visit: Payer: Commercial Managed Care - HMO | Admitting: Orthopaedic Surgery

## 2016-03-28 ENCOUNTER — Ambulatory Visit (INDEPENDENT_AMBULATORY_CARE_PROVIDER_SITE_OTHER): Payer: Commercial Managed Care - HMO | Admitting: Orthopaedic Surgery

## 2016-03-28 VITALS — BP 94/67 | HR 66 | Temp 96.8°F | Ht 70.0 in | Wt 212.0 lb

## 2016-03-28 DIAGNOSIS — M25561 Pain in right knee: Secondary | ICD-10-CM

## 2016-03-28 DIAGNOSIS — F17200 Nicotine dependence, unspecified, uncomplicated: Secondary | ICD-10-CM

## 2016-03-28 DIAGNOSIS — M25562 Pain in left knee: Secondary | ICD-10-CM | POA: Diagnosis not present

## 2016-03-28 DIAGNOSIS — G8929 Other chronic pain: Secondary | ICD-10-CM | POA: Diagnosis not present

## 2016-03-28 MED ORDER — OXYCODONE-ACETAMINOPHEN 7.5-325 MG PO TABS: 1.0000 | ORAL_TABLET | ORAL | 0 refills | Status: DC | PRN

## 2016-03-28 NOTE — Progress Notes (Signed)
Patient Chris Lopez, male DOB:[birth year], 70 y.o. HKV:[id number]  Chief Complaint  Patient presents with  . Follow-up    knee pain    HPI  Chris Lopez is a 70 y.o. male who has chronic pain of both knees.  He has bilateral total knees.  The left one is more tender.  He had recent large boil on his buttocks area treated by Dr. Merlyn Albert.  He is much improved.  He fell two weeks ago and hurt his back.  He is better. HPI  Body mass index is 30.42 kg/m.  ROS  Review of Systems  HENT: Negative for congestion.   Respiratory: Positive for shortness of breath. Negative for cough.   Cardiovascular: Negative for chest pain and leg swelling.  Endocrine: Positive for cold intolerance.  Musculoskeletal: Positive for arthralgias, back pain, gait problem and myalgias.  Allergic/Immunologic: Positive for environmental allergies.    Past Medical History:  Diagnosis Date  . Anxiety   . BPH (benign prostatic hyperplasia)   . Chronic bronchitis   . COPD (chronic obstructive pulmonary disease) ([place])   . Depression   . Diabetes mellitus without complication ([place])   . Hypertension     Past Surgical History:  Procedure Laterality Date  . carpal tunnel right hand    . CERVICAL FUSION    . left knee replacement    . left knee revised X 3    . multiple knee arthroscopies    . right knee replacement    . tarsal tunnel repair    . THYROIDECTOMY, PARTIAL    . TONSILLECTOMY      Family History  Problem Relation Age of Onset  . Colon cancer Neg Hx     Social History Social History  Substance Use Topics  . Smoking status: Former Smoker    Packs/day: 1.00    Years: 40.00    Types: Cigarettes    Quit date: 06/29/2010  . Smokeless tobacco: Never Used     Comment: using electronic cigarettes  . Alcohol use No    Allergies  Allergen Reactions  . Bee Venom Anaphylaxis    Allergic to bee stings not the kit  . Feldene [Piroxicam] Rash and Dermatitis    Current Outpatient  Prescriptions  Medication Sig Dispense Refill  . aspirin 325 MG tablet Take 325 mg by mouth daily.     Marland Kitchen atorvastatin (LIPITOR) 20 MG tablet Take 20 mg by mouth daily. Reported on 06/07/2015    . diazepam (VALIUM) 5 MG tablet One every 12 hours as needed for spasm 40 tablet 1  . diclofenac (VOLTAREN) 75 MG EC tablet TAKE 1 TABLET TWICE DAILY 180 tablet 5  . gabapentin (NEURONTIN) 300 MG capsule Take 300 mg by mouth 3 (three) times daily. Reported on 06/07/2015    . LORazepam (ATIVAN) 1 MG tablet Take 1 mg by mouth at bedtime. Reported on 06/07/2015    . losartan (COZAAR) 100 MG tablet Take 100 mg by mouth daily.    . metFORMIN (GLUCOPHAGE) 500 MG tablet Take 500 mg by mouth 2 (two) times daily. Reported on 06/07/2015    . metoprolol (LOPRESSOR) 50 MG tablet Take 50 mg by mouth 2 (two) times daily.     . nitroGLYCERIN (NITROSTAT) 0.4 MG SL tablet Place 0.4 mg under the tongue every 5 (five) minutes x 3 doses as needed. For chest pain    . omeprazole (PRILOSEC OTC) 20 MG tablet Take 20 mg by mouth daily. Reported on 06/07/2015    .  oxyCODONE-acetaminophen (PERCOCET) 7.5-325 MG tablet Take 1 tablet by mouth every 4 (four) hours as needed for moderate pain or severe pain (Must last 21 days). 90 tablet 0  . Tamsulosin HCl (FLOMAX) 0.4 MG CAPS Take 0.4 mg by mouth daily. Reported on 06/07/2015    . venlafaxine (EFFEXOR-XR) 150 MG 24 hr capsule Take 150 mg by mouth daily.       No current facility-administered medications for this visit.      Physical Exam  Blood pressure 94/67, pulse 66, temperature (!) 96.8 F (36 C), height _0  (1.778 m), weight 212 lb (96.2 kg).  Constitutional: overall normal hygiene, normal nutrition, well developed, normal grooming, normal body habitus. Assistive device:cane  Musculoskeletal: gait and station Limp left, muscle tone and strength are normal, no tremors or atrophy is present.  .  Neurological: coordination overall normal.  Deep tendon reflex/nerve stretch  intact.  Sensation normal.  Cranial nerves II-XII intact.   Skin:   Normal overall no scars, lesions, ulcers or rashes. No psoriasis.  Psychiatric: Alert and oriented x 3.  Recent memory intact, remote memory unclear.  Normal mood and affect. Well groomed.  Good eye contact.  Cardiovascular: overall no swelling, no varicosities, no edema bilaterally, normal temperatures of the legs and arms, no clubbing, cyanosis and good capillary refill.  Lymphatic: palpation is normal.  Both knees have scars.  He has ROM of 0 to 105 right, 0 to 100 left.  NV intact.  Knees are stable.  The patient has been educated about the nature of the problem(s) and counseled on treatment options.  The patient appeared to understand what I have discussed and is in agreement with it.  Encounter Diagnoses  Name Primary?  . Chronic pain of both knees Yes  . Tobacco smoker within last 12 months     PLAN Call if any problems.  Precautions discussed.  Continue current medications.   Return to clinic 2 months   Electronically Signed Sanjuana Kava, MD 12/20/201710:56 AM

## 2016-04-16 ENCOUNTER — Telehealth: Payer: Self-pay | Admitting: Orthopaedic Surgery

## 2016-04-16 MED ORDER — OXYCODONE-ACETAMINOPHEN 7.5-325 MG PO TABS: 1.0000 | ORAL_TABLET | ORAL | 0 refills | Status: DC | PRN

## 2016-04-20 ENCOUNTER — Telehealth: Payer: Self-pay | Admitting: Orthopaedic Surgery

## 2016-04-20 NOTE — Telephone Encounter (Signed)
Diazepam (VALIUM) 5 mg  Qty 40 Tablets

## 2016-04-24 MED ORDER — DIAZEPAM 5 MG PO TABS: ORAL_TABLET | ORAL | 1 refills | Status: DC

## 2016-05-10 ENCOUNTER — Telehealth: Payer: Self-pay | Admitting: Orthopaedic Surgery

## 2016-05-10 MED ORDER — OXYCODONE-ACETAMINOPHEN 7.5-325 MG PO TABS: 1.0000 | ORAL_TABLET | ORAL | 0 refills | Status: DC | PRN

## 2016-05-10 NOTE — Telephone Encounter (Signed)
Oxycodone-Acetaminophen  7.5/325mg   Qty 90 Tablets

## 2016-05-30 ENCOUNTER — Ambulatory Visit (INDEPENDENT_AMBULATORY_CARE_PROVIDER_SITE_OTHER): Payer: Medicare HMO | Admitting: Orthopaedic Surgery

## 2016-05-30 ENCOUNTER — Encounter: Payer: Self-pay | Admitting: Orthopaedic Surgery

## 2016-05-30 VITALS — BP 108/69 | HR 66 | Temp 96.8°F | Ht 70.0 in | Wt 218.0 lb

## 2016-05-30 DIAGNOSIS — M25561 Pain in right knee: Secondary | ICD-10-CM | POA: Diagnosis not present

## 2016-05-30 DIAGNOSIS — G8929 Other chronic pain: Secondary | ICD-10-CM

## 2016-05-30 DIAGNOSIS — M25562 Pain in left knee: Secondary | ICD-10-CM

## 2016-05-30 MED ORDER — OXYCODONE-ACETAMINOPHEN 7.5-325 MG PO TABS: 1.0000 | ORAL_TABLET | ORAL | 0 refills | Status: DC | PRN

## 2016-05-30 NOTE — Progress Notes (Signed)
Patient NO:BSJG Chris Lopez, male DOB:[birth year], 71 y.o. GEZ:[id number]  Chief Complaint  Patient presents with  . Follow-up    CHRONIC KNEE PAIN    HPI  Chris Lopez is a 71 y.o. male who has chronic pain of both total knees, more pain on the left.  He has no new trauma.  He wants a more substantial brace for the left knee and I have given him a Rx for this.  He has no new trauma. HPI  Body mass index is 31.28 kg/m.  ROS  Review of Systems  HENT: Negative for congestion.   Respiratory: Positive for shortness of breath. Negative for cough.   Cardiovascular: Negative for chest pain and leg swelling.  Endocrine: Positive for cold intolerance.  Musculoskeletal: Positive for arthralgias, back pain, gait problem and myalgias.  Allergic/Immunologic: Positive for environmental allergies.    Past Medical History:  Diagnosis Date  . Anxiety   . BPH (benign prostatic hyperplasia)   . Chronic bronchitis   . COPD (chronic obstructive pulmonary disease) ([place])   . Depression   . Diabetes mellitus without complication ([place])   . Hypertension     Past Surgical History:  Procedure Laterality Date  . carpal tunnel right hand    . CERVICAL FUSION    . left knee replacement    . left knee revised X 3    . multiple knee arthroscopies    . right knee replacement    . tarsal tunnel repair    . THYROIDECTOMY, PARTIAL    . TONSILLECTOMY      Family History  Problem Relation Age of Onset  . Colon cancer Neg Hx     Social History Social History  Substance Use Topics  . Smoking status: Former Smoker    Packs/day: 1.00    Years: 40.00    Types: Cigarettes    Quit date: 06/29/2010  . Smokeless tobacco: Never Used     Comment: using electronic cigarettes  . Alcohol use No    Allergies  Allergen Reactions  . Bee Venom Anaphylaxis    Allergic to bee stings not the kit  . Feldene [Piroxicam] Rash and Dermatitis    Current Outpatient Prescriptions  Medication Sig Dispense  Refill  . aspirin 325 MG tablet Take 325 mg by mouth daily.     Marland Kitchen atorvastatin (LIPITOR) 20 MG tablet Take 20 mg by mouth daily. Reported on 06/07/2015    . diazepam (VALIUM) 5 MG tablet One every 12 hours as needed for spasm 40 tablet 1  . diclofenac (VOLTAREN) 75 MG EC tablet TAKE 1 TABLET TWICE DAILY 180 tablet 5  . gabapentin (NEURONTIN) 300 MG capsule Take 300 mg by mouth 3 (three) times daily. Reported on 06/07/2015    . LORazepam (ATIVAN) 1 MG tablet Take 1 mg by mouth at bedtime. Reported on 06/07/2015    . losartan (COZAAR) 100 MG tablet Take 100 mg by mouth daily.    . metFORMIN (GLUCOPHAGE) 500 MG tablet Take 500 mg by mouth 2 (two) times daily. Reported on 06/07/2015    . metoprolol (LOPRESSOR) 50 MG tablet Take 50 mg by mouth 2 (two) times daily.     . nitroGLYCERIN (NITROSTAT) 0.4 MG SL tablet Place 0.4 mg under the tongue every 5 (five) minutes x 3 doses as needed. For chest pain    . omeprazole (PRILOSEC OTC) 20 MG tablet Take 20 mg by mouth daily. Reported on 06/07/2015    . oxyCODONE-acetaminophen (PERCOCET) 7.5-325 MG tablet  Take 1 tablet by mouth every 4 (four) hours as needed for moderate pain or severe pain (Must last 21 days). 90 tablet 0  . oxyCODONE-acetaminophen (PERCOCET) 7.5-325 MG tablet Take 1 tablet by mouth every 4 (four) hours as needed for moderate pain or severe pain (Must last 21 days). 90 tablet 0  . Tamsulosin HCl (FLOMAX) 0.4 MG CAPS Take 0.4 mg by mouth daily. Reported on 06/07/2015    . venlafaxine (EFFEXOR-XR) 150 MG 24 hr capsule Take 150 mg by mouth daily.       No current facility-administered medications for this visit.      Physical Exam  Blood pressure 108/69, pulse 66, temperature (!) 96.8 F (36 C), height 5' 10"  (1.778 m), weight 218 lb (98.9 kg).  Constitutional: overall normal hygiene, normal nutrition, well developed, normal grooming, normal body habitus. Assistive device:braces  Musculoskeletal: gait and station Limp left, muscle tone and  strength are normal, no tremors or atrophy is present.  .  Neurological: coordination overall normal.  Deep tendon reflex/nerve stretch intact.  Sensation normal.  Cranial nerves II-XII intact.   Skin:   Normal overall knee  scars, no lesions, ulcers or rashes. No psoriasis.  Psychiatric: Alert and oriented x 3.  Recent memory intact, remote memory unclear.  Normal mood and affect. Well groomed.  Good eye contact.  Cardiovascular: overall no swelling, no varicosities, no edema bilaterally, normal temperatures of the legs and arms, no clubbing, cyanosis and good capillary refill.  Lymphatic: palpation is normal.  The bilateral lower extremity is examined:  Inspection:  Thigh:  Non-tender and no defects  Knee does not have swelling 0 effusion.                        Joint tenderness is not present                        Patient is not tender over the medial joint line  Lower Leg:  Has normal appearance and no tenderness or defects  Ankle:  Non-tender and no defects  Foot:  Non-tender and no defects Range of Motion:  Knee:  Range of motion is: 0-110 right; 0-100 left                        Crepitus is  present  Ankle:  Range of motion is normal. Strength and Tone:  The bilateral lower extremity has normal strength and tone. Stability:  Knee:  The knee is stable.  Ankle:  The ankle is stable.    The patient has been educated about the nature of the problem(s) and counseled on treatment options.  The patient appeared to understand what I have discussed and is in agreement with it.  Encounter Diagnosis  Name Primary?  . Chronic pain of both knees Yes    PLAN Call if any problems.  Precautions discussed.  Continue current medications.   Return to clinic 2 months   I have reviewed the [place] web site prior to prescribing narcotic medicine for this patient.  Electronically Signed Sanjuana Kava, MD 2/21/201810:55 AM

## 2016-06-04 ENCOUNTER — Telehealth: Payer: Self-pay | Admitting: Orthopaedic Surgery

## 2016-06-26 DIAGNOSIS — I10 Essential (primary) hypertension: Secondary | ICD-10-CM | POA: Diagnosis not present

## 2016-06-26 DIAGNOSIS — E119 Type 2 diabetes mellitus without complications: Secondary | ICD-10-CM | POA: Diagnosis not present

## 2016-06-26 DIAGNOSIS — Z1159 Encounter for screening for other viral diseases: Secondary | ICD-10-CM | POA: Diagnosis not present

## 2016-06-27 ENCOUNTER — Telehealth: Payer: Self-pay | Admitting: Orthopaedic Surgery

## 2016-06-27 MED ORDER — OXYCODONE-ACETAMINOPHEN 7.5-325 MG PO TABS: 1.0000 | ORAL_TABLET | ORAL | 0 refills | Status: DC | PRN

## 2016-06-28 DIAGNOSIS — E782 Mixed hyperlipidemia: Secondary | ICD-10-CM | POA: Diagnosis not present

## 2016-06-28 DIAGNOSIS — G894 Chronic pain syndrome: Secondary | ICD-10-CM | POA: Diagnosis not present

## 2016-06-28 DIAGNOSIS — R197 Diarrhea, unspecified: Secondary | ICD-10-CM | POA: Diagnosis not present

## 2016-06-28 DIAGNOSIS — D696 Thrombocytopenia, unspecified: Secondary | ICD-10-CM | POA: Diagnosis not present

## 2016-06-28 DIAGNOSIS — I10 Essential (primary) hypertension: Secondary | ICD-10-CM | POA: Diagnosis not present

## 2016-06-28 DIAGNOSIS — R944 Abnormal results of kidney function studies: Secondary | ICD-10-CM | POA: Diagnosis not present

## 2016-06-28 DIAGNOSIS — E119 Type 2 diabetes mellitus without complications: Secondary | ICD-10-CM | POA: Diagnosis not present

## 2016-06-28 DIAGNOSIS — E46 Unspecified protein-calorie malnutrition: Secondary | ICD-10-CM | POA: Diagnosis not present

## 2016-06-28 DIAGNOSIS — D509 Iron deficiency anemia, unspecified: Secondary | ICD-10-CM | POA: Diagnosis not present

## 2016-07-19 ENCOUNTER — Telehealth: Payer: Self-pay | Admitting: Orthopaedic Surgery

## 2016-07-19 MED ORDER — OXYCODONE-ACETAMINOPHEN 7.5-325 MG PO TABS: 1.0000 | ORAL_TABLET | ORAL | 0 refills | Status: DC | PRN

## 2016-08-01 ENCOUNTER — Telehealth: Payer: Self-pay | Admitting: Orthopaedic Surgery

## 2016-08-02 ENCOUNTER — Ambulatory Visit: Payer: Medicare HMO | Admitting: Orthopaedic Surgery

## 2016-08-09 ENCOUNTER — Telehealth: Payer: Self-pay | Admitting: Orthopedic Surgery

## 2016-08-09 DIAGNOSIS — D509 Iron deficiency anemia, unspecified: Secondary | ICD-10-CM | POA: Diagnosis not present

## 2016-08-09 NOTE — Telephone Encounter (Signed)
Patient of Dr. Brooke Bonito requests refill on Oxycodone/Acetaminophen 7.5-325    Qty  90  Sig: Take 1 tablet by mouth every 4 (four) hours as needed for moderate pain or severe pain (Must last 21 days).

## 2016-08-13 ENCOUNTER — Other Ambulatory Visit: Payer: Self-pay | Admitting: *Deleted

## 2016-08-13 MED ORDER — OXYCODONE-ACETAMINOPHEN 7.5-325 MG PO TABS: 1.0000 | ORAL_TABLET | ORAL | 0 refills | Status: DC | PRN

## 2016-08-15 ENCOUNTER — Ambulatory Visit (INDEPENDENT_AMBULATORY_CARE_PROVIDER_SITE_OTHER): Payer: Medicare HMO | Admitting: Orthopaedic Surgery

## 2016-08-15 ENCOUNTER — Encounter: Payer: Self-pay | Admitting: Orthopaedic Surgery

## 2016-08-15 VITALS — BP 127/81 | HR 68 | Temp 98.1°F | Ht 70.0 in | Wt 228.0 lb

## 2016-08-15 DIAGNOSIS — M25561 Pain in right knee: Secondary | ICD-10-CM | POA: Diagnosis not present

## 2016-08-15 DIAGNOSIS — G8929 Other chronic pain: Secondary | ICD-10-CM | POA: Diagnosis not present

## 2016-08-15 DIAGNOSIS — M25562 Pain in left knee: Secondary | ICD-10-CM

## 2016-08-15 NOTE — Progress Notes (Signed)
Patient Chris Lopez, male DOB:16-Apr-1945, 71 y.o. [id number]  Chief Complaint  Patient presents with  . Follow-up    Bilateral knee pain    HPI  Chris Lopez is a 71 y.o. male who has bilateral total knees.  His left knee is more painful.  He wears a brace on it.  He uses a cane.  He has no new trauma or giving way.  He had that knee done at [facility] and he will contact them about it bothering him more.  He is active and taking his medicine. HPI  Body mass index is 32.71 kg/m.  ROS  Review of Systems  HENT: Negative for congestion.   Respiratory: Positive for shortness of breath. Negative for cough.   Cardiovascular: Negative for chest pain and leg swelling.  Endocrine: Positive for cold intolerance.  Musculoskeletal: Positive for arthralgias, back pain, gait problem and myalgias.  Allergic/Immunologic: Positive for environmental allergies.    Past Medical History:  Diagnosis Date  . Anxiety   . BPH (benign prostatic hyperplasia)   . Chronic bronchitis   . COPD (chronic obstructive pulmonary disease) ([place])   . Depression   . Diabetes mellitus without complication ([place])   . Hypertension     Past Surgical History:  Procedure Laterality Date  . carpal tunnel right hand    . CERVICAL FUSION    . left knee replacement    . left knee revised X 3    . multiple knee arthroscopies    . right knee replacement    . tarsal tunnel repair    . THYROIDECTOMY, PARTIAL    . TONSILLECTOMY      Family History  Problem Relation Age of Onset  . Colon cancer Neg Hx     Social History Social History  Substance Use Topics  . Smoking status: Former Smoker    Packs/day: 1.00    Years: 40.00    Types: Cigarettes    Quit date: 06/29/2010  . Smokeless tobacco: Never Used     Comment: using electronic cigarettes  . Alcohol use No    Allergies  Allergen Reactions  . Bee Venom Anaphylaxis    Allergic to bee stings not the kit  . Feldene [Piroxicam] Rash  and Dermatitis    Current Outpatient Prescriptions  Medication Sig Dispense Refill  . aspirin 325 MG tablet Take 325 mg by mouth daily.     Marland Kitchen atorvastatin (LIPITOR) 20 MG tablet Take 20 mg by mouth daily. Reported on 06/07/2015    . diazepam (VALIUM) 5 MG tablet TAKE 1 TABLET BY MOUTH EVERY 12 HOURS AS NEEDED FOR SPASM 40 tablet 1  . diclofenac (VOLTAREN) 75 MG EC tablet TAKE 1 TABLET TWICE DAILY 180 tablet 5  . gabapentin (NEURONTIN) 300 MG capsule Take 300 mg by mouth 3 (three) times daily. Reported on 06/07/2015    . LORazepam (ATIVAN) 1 MG tablet Take 1 mg by mouth at bedtime. Reported on 06/07/2015    . losartan (COZAAR) 100 MG tablet Take 100 mg by mouth daily.    . metFORMIN (GLUCOPHAGE) 500 MG tablet Take 500 mg by mouth 2 (two) times daily. Reported on 06/07/2015    . metoprolol (LOPRESSOR) 50 MG tablet Take 50 mg by mouth 2 (two) times daily.     . nitroGLYCERIN (NITROSTAT) 0.4 MG SL tablet Place 0.4 mg under the tongue every 5 (five) minutes x 3 doses as needed. For chest pain    . omeprazole (PRILOSEC OTC) 20  MG tablet Take 20 mg by mouth daily. Reported on 06/07/2015    . oxyCODONE-acetaminophen (PERCOCET) 7.5-325 MG tablet Take 1 tablet by mouth every 4 (four) hours as needed for moderate pain or severe pain (Must last 21 days). 90 tablet 0  . Tamsulosin HCl (FLOMAX) 0.4 MG CAPS Take 0.4 mg by mouth daily. Reported on 06/07/2015    . venlafaxine (EFFEXOR-XR) 150 MG 24 hr capsule Take 150 mg by mouth daily.       No current facility-administered medications for this visit.      Physical Exam  Blood pressure 127/81, pulse 68, temperature 98.1 F (36.7 C), height 5' 10"  (1.778 m), weight 228 lb (103.4 kg).  Constitutional: overall normal hygiene, normal nutrition, well developed, normal grooming, normal body habitus. Assistive device:cane  Musculoskeletal: gait and station Limp left, muscle tone and strength are normal, no tremors or atrophy is present.  .  Neurological:  coordination overall normal.  Deep tendon reflex/nerve stretch intact.  Sensation normal.  Cranial nerves II-XII intact.   Skin:   Normal overall no scars, lesions, ulcers or rashes. No psoriasis.  Psychiatric: Alert and oriented x 3.  Recent memory intact, remote memory unclear.  Normal mood and affect. Well groomed.  Good eye contact.  Cardiovascular: overall no swelling, no varicosities, no edema bilaterally, normal temperatures of the legs and arms, no clubbing, cyanosis and good capillary refill.  Lymphatic: palpation is normal.  Both knees have anterior midline scars. The right knee is not tender, ROM 0 to 110 and stable.  The left knee has pain and ROM 0-100 with crepitus.  He has medial tenderness and slight laxity.  NV intact.  He has limp to the left.  He has no effusion or redness.  The patient has been educated about the nature of the problem(s) and counseled on treatment options.  The patient appeared to understand what I have discussed and is in agreement with it.  Encounter Diagnosis  Name Primary?  . Chronic pain of both knees Yes    PLAN Call if any problems.  Precautions discussed.  Continue current medications.   Return to clinic 2 months   Electronically Signed Sanjuana Kava, MD 5/9/201811:09 AM

## 2016-09-04 ENCOUNTER — Telehealth: Payer: Self-pay | Admitting: Orthopedic Surgery

## 2016-09-04 MED ORDER — OXYCODONE-ACETAMINOPHEN 7.5-325 MG PO TABS: 1.0000 | ORAL_TABLET | ORAL | 0 refills | Status: DC | PRN

## 2016-09-14 ENCOUNTER — Telehealth: Payer: Self-pay | Admitting: Orthopaedic Surgery

## 2016-09-20 ENCOUNTER — Other Ambulatory Visit: Payer: Self-pay | Admitting: Orthopaedic Surgery

## 2016-09-26 ENCOUNTER — Telehealth: Payer: Self-pay | Admitting: Orthopaedic Surgery

## 2016-09-26 MED ORDER — OXYCODONE-ACETAMINOPHEN 7.5-325 MG PO TABS: 1.0000 | ORAL_TABLET | ORAL | 0 refills | Status: DC | PRN

## 2016-10-17 ENCOUNTER — Ambulatory Visit (INDEPENDENT_AMBULATORY_CARE_PROVIDER_SITE_OTHER): Payer: Medicare HMO | Admitting: Orthopaedic Surgery

## 2016-10-17 VITALS — BP 107/69 | HR 60 | Temp 97.3°F | Ht 70.0 in | Wt 222.0 lb

## 2016-10-17 DIAGNOSIS — G8929 Other chronic pain: Secondary | ICD-10-CM

## 2016-10-17 DIAGNOSIS — M25561 Pain in right knee: Secondary | ICD-10-CM | POA: Diagnosis not present

## 2016-10-17 DIAGNOSIS — M25562 Pain in left knee: Secondary | ICD-10-CM | POA: Diagnosis not present

## 2016-10-17 MED ORDER — OXYCODONE-ACETAMINOPHEN 7.5-325 MG PO TABS: 1.0000 | ORAL_TABLET | ORAL | 0 refills | Status: DC | PRN

## 2016-10-17 NOTE — Progress Notes (Signed)
Patient Chris Lopez, male DOB:Feb 15, 1946, 71 y.o. [id number]  Chief Complaint  Patient presents with  . Follow-up    knee pain    HPI  Chris Lopez is a 71 y.o. male who has bilateral knee pain, post bilateral total knees, more pain on the left side. He has already had three knee replacements on the left and will need another.  He has been followed at [facility] for this.  He has no new trauma. HPI  Body mass index is 31.85 kg/m.  ROS  Review of Systems  HENT: Negative for congestion.   Respiratory: Positive for shortness of breath. Negative for cough.   Cardiovascular: Negative for chest pain and leg swelling.  Endocrine: Positive for cold intolerance.  Musculoskeletal: Positive for arthralgias, back pain, gait problem and myalgias.  Allergic/Immunologic: Positive for environmental allergies.    Past Medical History:  Diagnosis Date  . Anxiety   . BPH (benign prostatic hyperplasia)   . Chronic bronchitis   . COPD (chronic obstructive pulmonary disease) ([place])   . Depression   . Diabetes mellitus without complication ([place])   . Hypertension     Past Surgical History:  Procedure Laterality Date  . carpal tunnel right hand    . CERVICAL FUSION    . left knee replacement    . left knee revised X 3    . multiple knee arthroscopies    . right knee replacement    . tarsal tunnel repair    . THYROIDECTOMY, PARTIAL    . TONSILLECTOMY      Family History  Problem Relation Age of Onset  . Colon cancer Neg Hx     Social History Social History  Substance Use Topics  . Smoking status: Former Smoker    Packs/day: 1.00    Years: 40.00    Types: Cigarettes    Quit date: 06/29/2010  . Smokeless tobacco: Never Used     Comment: using electronic cigarettes  . Alcohol use No    Allergies  Allergen Reactions  . Bee Venom Anaphylaxis    Allergic to bee stings not the kit  . Feldene [Piroxicam] Rash and Dermatitis    Current Outpatient  Prescriptions  Medication Sig Dispense Refill  . aspirin 325 MG tablet Take 325 mg by mouth daily.     Marland Kitchen atorvastatin (LIPITOR) 20 MG tablet Take 20 mg by mouth daily. Reported on 06/07/2015    . diazepam (VALIUM) 5 MG tablet TAKE 1 TABLET BY MOUTH EVERY 12 HOURS AS NEEDED FOR  SPASMS 40 tablet 1  . diclofenac (VOLTAREN) 75 MG EC tablet TAKE 1 TABLET TWICE DAILY 180 tablet 5  . gabapentin (NEURONTIN) 300 MG capsule Take 300 mg by mouth 3 (three) times daily. Reported on 06/07/2015    . LORazepam (ATIVAN) 1 MG tablet Take 1 mg by mouth at bedtime. Reported on 06/07/2015    . losartan (COZAAR) 100 MG tablet Take 100 mg by mouth daily.    . metFORMIN (GLUCOPHAGE) 500 MG tablet Take 500 mg by mouth 2 (two) times daily. Reported on 06/07/2015    . metoprolol (LOPRESSOR) 50 MG tablet Take 50 mg by mouth 2 (two) times daily.     . nitroGLYCERIN (NITROSTAT) 0.4 MG SL tablet Place 0.4 mg under the tongue every 5 (five) minutes x 3 doses as needed. For chest pain    . omeprazole (PRILOSEC OTC) 20 MG tablet Take 20 mg by mouth daily. Reported on 06/07/2015    .  oxyCODONE-acetaminophen (PERCOCET) 7.5-325 MG tablet Take 1 tablet by mouth every 4 (four) hours as needed for moderate pain or severe pain (Must last 21 days). 90 tablet 0  . Tamsulosin HCl (FLOMAX) 0.4 MG CAPS Take 0.4 mg by mouth daily. Reported on 06/07/2015    . venlafaxine (EFFEXOR-XR) 150 MG 24 hr capsule Take 150 mg by mouth daily.       No current facility-administered medications for this visit.      Physical Exam  Blood pressure 107/69, pulse 60, temperature (!) 97.3 F (36.3 C), height _0  (1.778 m), weight 222 lb (100.7 kg).  Constitutional: overall normal hygiene, normal nutrition, well developed, normal grooming, normal body habitus. Assistive device:cane, neoprene sleeve brace  Musculoskeletal: gait and station Limp left, muscle tone and strength are normal, no tremors or atrophy is present.  .  Neurological: coordination  overall normal.  Deep tendon reflex/nerve stretch intact.  Sensation normal.  Cranial nerves II-XII intact.   Skin:   Normal overall multiple knee scars, lesions, ulcers or rashes. No psoriasis.  Psychiatric: Alert and oriented x 3.  Recent memory intact, remote memory unclear.  Normal mood and affect. Well groomed.  Good eye contact.  Cardiovascular: overall no swelling, no varicosities, no edema bilaterally, normal temperatures of the legs and arms, no clubbing, cyanosis and good capillary refill.  Lymphatic: palpation is normal.  Both knees have post surgical midline scars.  The right knee has ROM 0 to 115, the left 0 to 105.  The left knee hurts and has crepitus.  He has knee sleeve on the left.  He uses a cane.  The patient has been educated about the nature of the problem(s) and counseled on treatment options.  The patient appeared to understand what I have discussed and is in agreement with it.  Encounter Diagnosis  Name Primary?  . Chronic pain of both knees Yes    PLAN Call if any problems.  Precautions discussed.  Continue current medications.   Return to clinic 2 months   I have reviewed the [place] web site prior to prescribing narcotic medicine for this patient.  He will make appointment to be seen at [facility] for re-evaluation.  Electronically Signed Sanjuana Kava, MD 7/11/201811:08 AM

## 2016-10-31 ENCOUNTER — Telehealth: Payer: Self-pay | Admitting: Orthopaedic Surgery

## 2016-11-02 ENCOUNTER — Encounter: Payer: Self-pay | Admitting: Orthopaedic Surgery

## 2016-11-07 ENCOUNTER — Telehealth: Payer: Self-pay | Admitting: Orthopaedic Surgery

## 2016-11-08 MED ORDER — OXYCODONE-ACETAMINOPHEN 7.5-325 MG PO TABS: 1.0000 | ORAL_TABLET | ORAL | 0 refills | Status: DC | PRN

## 2016-11-29 ENCOUNTER — Telehealth: Payer: Self-pay | Admitting: Orthopaedic Surgery

## 2016-11-29 MED ORDER — OXYCODONE-ACETAMINOPHEN 7.5-325 MG PO TABS: 1.0000 | ORAL_TABLET | ORAL | 0 refills | Status: DC | PRN

## 2016-12-12 ENCOUNTER — Telehealth: Payer: Self-pay | Admitting: Orthopaedic Surgery

## 2016-12-19 ENCOUNTER — Ambulatory Visit (INDEPENDENT_AMBULATORY_CARE_PROVIDER_SITE_OTHER): Payer: Medicare HMO | Admitting: Orthopaedic Surgery

## 2016-12-19 ENCOUNTER — Encounter: Payer: Self-pay | Admitting: Orthopaedic Surgery

## 2016-12-19 VITALS — BP 126/79 | HR 53 | Temp 97.0°F | Ht 70.0 in | Wt 223.0 lb

## 2016-12-19 DIAGNOSIS — M25561 Pain in right knee: Secondary | ICD-10-CM

## 2016-12-19 DIAGNOSIS — G8929 Other chronic pain: Secondary | ICD-10-CM

## 2016-12-19 DIAGNOSIS — F17200 Nicotine dependence, unspecified, uncomplicated: Secondary | ICD-10-CM | POA: Diagnosis not present

## 2016-12-19 DIAGNOSIS — M25562 Pain in left knee: Secondary | ICD-10-CM

## 2016-12-19 MED ORDER — OXYCODONE-ACETAMINOPHEN 7.5-325 MG PO TABS: 1.0000 | ORAL_TABLET | ORAL | 0 refills | Status: DC | PRN

## 2016-12-19 NOTE — Progress Notes (Signed)
Patient EY:CXKG Chris Lopez, male DOB:1946-01-12, 71 y.o. [id number]  Chief Complaint  Patient presents with  . Follow-up    Chronic pain both knees    HPI  Chris Lopez is a 71 y.o. male who has chronic knee pain with both knees, more on the left.  He has an appointment at [facility] for the left knee. He has bilateral total knees.  The left knee now has swelling and he is developing a slight knock-knee position when standing.  He uses a knee sleeve on the left and uses a cane.  The right knee is not that painful. HPI  Body mass index is 32 kg/m.  ROS  Review of Systems  HENT: Negative for congestion.   Respiratory: Positive for shortness of breath. Negative for cough.   Cardiovascular: Negative for chest pain and leg swelling.  Endocrine: Positive for cold intolerance.  Musculoskeletal: Positive for arthralgias, back pain, gait problem and myalgias.  Allergic/Immunologic: Positive for environmental allergies.    Past Medical History:  Diagnosis Date  . Anxiety   . BPH (benign prostatic hyperplasia)   . Chronic bronchitis   . COPD (chronic obstructive pulmonary disease) ([place])   . Depression   . Diabetes mellitus without complication ([place])   . Hypertension     Past Surgical History:  Procedure Laterality Date  . carpal tunnel right hand    . CERVICAL FUSION    . left knee replacement    . left knee revised X 3    . multiple knee arthroscopies    . right knee replacement    . tarsal tunnel repair    . THYROIDECTOMY, PARTIAL    . TONSILLECTOMY      Family History  Problem Relation Age of Onset  . Colon cancer Neg Hx     Social History Social History  Substance Use Topics  . Smoking status: Former Smoker    Packs/day: 1.00    Years: 40.00    Types: Cigarettes    Quit date: 06/29/2010  . Smokeless tobacco: Never Used     Comment: using electronic cigarettes  . Alcohol use No    Allergies  Allergen Reactions  . Bee Venom Anaphylaxis   Allergic to bee stings not the kit  . Feldene [Piroxicam] Rash and Dermatitis    Current Outpatient Prescriptions  Medication Sig Dispense Refill  . aspirin 325 MG tablet Take 325 mg by mouth daily.     Marland Kitchen atorvastatin (LIPITOR) 20 MG tablet Take 20 mg by mouth daily. Reported on 06/07/2015    . diazepam (VALIUM) 5 MG tablet TAKE 1 TABLET BY MOUTH EVERY 12 HOURS AS NEEDED FOR SPASMS 40 tablet 1  . diclofenac (VOLTAREN) 75 MG EC tablet TAKE 1 TABLET TWICE DAILY 180 tablet 5  . gabapentin (NEURONTIN) 300 MG capsule Take 300 mg by mouth 3 (three) times daily. Reported on 06/07/2015    . LORazepam (ATIVAN) 1 MG tablet Take 1 mg by mouth at bedtime. Reported on 06/07/2015    . losartan (COZAAR) 100 MG tablet Take 100 mg by mouth daily.    . metFORMIN (GLUCOPHAGE) 500 MG tablet Take 500 mg by mouth 2 (two) times daily. Reported on 06/07/2015    . metoprolol (LOPRESSOR) 50 MG tablet Take 50 mg by mouth 2 (two) times daily.     . nitroGLYCERIN (NITROSTAT) 0.4 MG SL tablet Place 0.4 mg under the tongue every 5 (five) minutes x 3 doses as needed. For chest pain    .  omeprazole (PRILOSEC OTC) 20 MG tablet Take 20 mg by mouth daily. Reported on 06/07/2015    . oxyCODONE-acetaminophen (PERCOCET) 7.5-325 MG tablet Take 1 tablet by mouth every 4 (four) hours as needed for moderate pain or severe pain (Must last 21 days). 90 tablet 0  . Tamsulosin HCl (FLOMAX) 0.4 MG CAPS Take 0.4 mg by mouth daily. Reported on 06/07/2015    . venlafaxine (EFFEXOR-XR) 150 MG 24 hr capsule Take 150 mg by mouth daily.       No current facility-administered medications for this visit.      Physical Exam  Blood pressure 126/79, pulse (!) 53, temperature (!) 97 F (36.1 C), height 5' 10"  (1.778 m), weight 223 lb (101.2 kg).  Constitutional: overall normal hygiene, normal nutrition, well developed, normal grooming, normal body habitus. Assistive device:cane  Musculoskeletal: gait and station Limp left, muscle tone and strength  are normal, no tremors or atrophy is present.  .  Neurological: coordination overall normal.  Deep tendon reflex/nerve stretch intact.  Sensation normal.  Cranial nerves II-XII intact.   Skin:   Normal overall no scars, lesions, ulcers or rashes. No psoriasis.  Psychiatric: Alert and oriented x 3.  Recent memory intact, remote memory unclear.  Normal mood and affect. Well groomed.  Good eye contact.  Cardiovascular: overall no swelling, no varicosities, no edema bilaterally, normal temperatures of the legs and arms, no clubbing, cyanosis and good capillary refill.  Lymphatic: palpation is normal.  All other systems reviewed and are negative   Left knee has midline scar, slight crepitus with motion, ROM 0 to 105, slight medial laxity, NV intact.  He has limp to left with walking.  The patient has been educated about the nature of the problem(s) and counseled on treatment options.  The patient appeared to understand what I have discussed and is in agreement with it.  Encounter Diagnoses  Name Primary?  . Chronic pain of both knees Yes  . Tobacco smoker within last 12 months     PLAN Call if any problems.  Precautions discussed.  Continue current medications.   Return to clinic 6 weeks   I have reviewed the [place] web site prior to prescribing narcotic medicine for this patient.  Electronically Signed Sanjuana Kava, MD 9/12/201811:32 AM

## 2016-12-19 NOTE — Patient Instructions (Signed)
Steps to Quit Smoking Smoking tobacco can be bad for your health. It can also affect almost every organ in your body. Smoking puts you and people around you at risk for many serious [place] Launer-lasting (chronic) diseases. Quitting smoking is hard, but it is one of the best things that you can do for your health. It is never too late to quit. What are the benefits of quitting smoking? When you quit smoking, you lower your risk for getting serious diseases and conditions. They can include:  Lung cancer or lung disease.  Heart disease.  Stroke.  Heart attack.  Not being able to have children (infertility).  Weak bones (osteoporosis) and broken bones (fractures).  If you have coughing, wheezing, and shortness of breath, those symptoms may get better when you quit. You may also get sick less often. If you are pregnant, quitting smoking can help to lower your chances of having a baby of low birth weight. What can I do to help me quit smoking? Talk with your doctor about what can help you quit smoking. Some things you can do (strategies) include:  Quitting smoking totally, instead of slowly cutting back how much you smoke over a period of time.  Going to in-person counseling. You are more likely to quit if you go to many counseling sessions.  Using resources and support systems, such as: ? Online chats with a counselor. ? Phone quitlines. ? Printed self-help materials. ? Support groups or group counseling. ? Text messaging programs. ? Mobile phone apps or applications.  Taking medicines. Some of these medicines may have nicotine in them. If you are pregnant or breastfeeding, do not take any medicines to quit smoking unless your doctor says it is okay. Talk with your doctor about counseling or other things that can help you.  Talk with your doctor about using more than one strategy at the same time, such as taking medicines while you are also going to in-person counseling. This can help make  quitting easier. What things can I do to make it easier to quit? Quitting smoking might feel very hard at first, but there is a lot that you can do to make it easier. Take these steps:  Talk to your family and friends. Ask them to support and encourage you.  Call phone quitlines, reach out to support groups, or work with a counselor.  Ask people who smoke to not smoke around you.  Avoid places that make you want (trigger) to smoke, such as: ? Bars. ? Parties. ? Smoke-break areas at work.  Spend time with people who do not smoke.  Lower the stress in your life. Stress can make you want to smoke. Try these things to help your stress: ? Getting regular exercise. ? Deep-breathing exercises. ? Yoga. ? Meditating. ? Doing a body scan. To do this, close your eyes, focus on one area of your body at a time from head to toe, and notice which parts of your body are tense. Try to relax the muscles in those areas.  Download or buy apps on your mobile phone or tablet that can help you stick to your quit plan. There are many free apps, such as QuitGuide from the CDC (Centers for Disease Control and Prevention). You can find more support from smokefree.gov and other websites.  This information is not intended to replace advice given to you by your health care provider. Make sure you discuss any questions you have with your health care provider. Document Released: 01/20/2009 Document   Revised: 11/22/2015 Document Reviewed: 08/10/2014 Elsevier Interactive Patient Education  2018 [employer]vier Inc.  

## 2017-01-14 ENCOUNTER — Telehealth: Payer: Self-pay | Admitting: Orthopaedic Surgery

## 2017-01-14 MED ORDER — OXYCODONE-ACETAMINOPHEN 7.5-325 MG PO TABS: 1.0000 | ORAL_TABLET | ORAL | 0 refills | Status: DC | PRN

## 2017-01-17 DIAGNOSIS — M25562 Pain in left knee: Secondary | ICD-10-CM | POA: Diagnosis not present

## 2017-01-17 DIAGNOSIS — Z96652 Presence of left artificial knee joint: Secondary | ICD-10-CM | POA: Diagnosis not present

## 2017-01-17 DIAGNOSIS — Z471 Aftercare following joint replacement surgery: Secondary | ICD-10-CM | POA: Diagnosis not present

## 2017-01-17 DIAGNOSIS — G8929 Other chronic pain: Secondary | ICD-10-CM | POA: Diagnosis not present

## 2017-01-23 ENCOUNTER — Other Ambulatory Visit: Payer: Self-pay | Admitting: Orthopaedic Surgery

## 2017-01-24 ENCOUNTER — Telehealth: Payer: Self-pay | Admitting: Orthopaedic Surgery

## 2017-01-24 NOTE — Telephone Encounter (Signed)
I cannot give as he is getting lorazepam from Dr. Nevada Crane.

## 2017-01-24 NOTE — Telephone Encounter (Signed)
Error

## 2017-01-30 ENCOUNTER — Ambulatory Visit (INDEPENDENT_AMBULATORY_CARE_PROVIDER_SITE_OTHER): Payer: Medicare HMO | Admitting: Orthopaedic Surgery

## 2017-01-30 ENCOUNTER — Encounter: Payer: Self-pay | Admitting: Orthopaedic Surgery

## 2017-01-30 VITALS — BP 132/73 | HR 55 | Temp 97.0°F | Ht 70.0 in | Wt 225.0 lb

## 2017-01-30 DIAGNOSIS — M25562 Pain in left knee: Secondary | ICD-10-CM | POA: Diagnosis not present

## 2017-01-30 DIAGNOSIS — G8929 Other chronic pain: Secondary | ICD-10-CM | POA: Diagnosis not present

## 2017-01-30 DIAGNOSIS — M25561 Pain in right knee: Secondary | ICD-10-CM | POA: Diagnosis not present

## 2017-01-30 DIAGNOSIS — F17200 Nicotine dependence, unspecified, uncomplicated: Secondary | ICD-10-CM

## 2017-01-30 MED ORDER — OXYCODONE-ACETAMINOPHEN 7.5-325 MG PO TABS
1.0000 | ORAL_TABLET | ORAL | 0 refills | Status: DC | PRN
Start: 2017-01-30 — End: 2017-02-26

## 2017-01-30 NOTE — Progress Notes (Signed)
Patient Chris Lopez, male DOB:08-Apr-1946, 72 y.o. IDP:[id number]  Chief Complaint  Patient presents with  . Knee Pain    bilateralL    HPI  Chris Lopez is a 71 y.o. male who has pain with the knees, more on the left.  He has bilateral total knees.  He was seen at [facility] and is scheduled for further testing of the left knee and possible revision surgery.  He has no new trauma. He has a cane and brace for the left knee. HPI  Body mass index is 32.28 kg/m.  ROS  Review of Systems  HENT: Negative for congestion.   Respiratory: Positive for shortness of breath. Negative for cough.   Cardiovascular: Negative for chest pain and leg swelling.  Endocrine: Positive for cold intolerance.  Musculoskeletal: Positive for arthralgias, back pain, gait problem and myalgias.  Allergic/Immunologic: Positive for environmental allergies.    Past Medical History:  Diagnosis Date  . Anxiety   . BPH (benign prostatic hyperplasia)   . Chronic bronchitis   . COPD (chronic obstructive pulmonary disease) ([place])   . Depression   . Diabetes mellitus without complication ([place])   . Hypertension     Past Surgical History:  Procedure Laterality Date  . carpal tunnel right hand    . CERVICAL FUSION    . left knee replacement    . left knee revised X 3    . multiple knee arthroscopies    . right knee replacement    . tarsal tunnel repair    . THYROIDECTOMY, PARTIAL    . TONSILLECTOMY      Family History  Problem Relation Age of Onset  . Colon cancer Neg Hx     Social History Social History  Substance Use Topics  . Smoking status: Former Smoker    Packs/day: 1.00    Years: 40.00    Types: Cigarettes    Quit date: 06/29/2010  . Smokeless tobacco: Never Used     Comment: using electronic cigarettes  . Alcohol use No    Allergies  Allergen Reactions  . Bee Venom Anaphylaxis    Allergic to bee stings not the kit  . Feldene [Piroxicam] Rash and Dermatitis     Current Outpatient Prescriptions  Medication Sig Dispense Refill  . aspirin 325 MG tablet Take 325 mg by mouth daily.     Marland Kitchen atorvastatin (LIPITOR) 20 MG tablet Take 20 mg by mouth daily. Reported on 06/07/2015    . diazepam (VALIUM) 5 MG tablet TAKE 1 TABLET BY MOUTH EVERY 12 HOURS AS NEEDED FOR SPASMS 40 tablet 1  . diclofenac (VOLTAREN) 75 MG EC tablet TAKE 1 TABLET TWICE DAILY 180 tablet 5  . gabapentin (NEURONTIN) 300 MG capsule Take 300 mg by mouth 3 (three) times daily. Reported on 06/07/2015    . LORazepam (ATIVAN) 1 MG tablet Take 1 mg by mouth at bedtime. Reported on 06/07/2015    . losartan (COZAAR) 100 MG tablet Take 100 mg by mouth daily.    . metFORMIN (GLUCOPHAGE) 500 MG tablet Take 500 mg by mouth 2 (two) times daily. Reported on 06/07/2015    . metoprolol (LOPRESSOR) 50 MG tablet Take 50 mg by mouth 2 (two) times daily.     . nitroGLYCERIN (NITROSTAT) 0.4 MG SL tablet Place 0.4 mg under the tongue every 5 (five) minutes x 3 doses as needed. For chest pain    . omeprazole (PRILOSEC OTC) 20 MG tablet Take 20 mg by mouth daily.  Reported on 06/07/2015    . oxyCODONE-acetaminophen (PERCOCET) 7.5-325 MG tablet Take 1 tablet by mouth every 4 (four) hours as needed for moderate pain or severe pain (Must last 21 days). 90 tablet 0  . Tamsulosin HCl (FLOMAX) 0.4 MG CAPS Take 0.4 mg by mouth daily. Reported on 06/07/2015    . venlafaxine (EFFEXOR-XR) 150 MG 24 hr capsule Take 150 mg by mouth daily.       No current facility-administered medications for this visit.      Physical Exam  Blood pressure 132/73, pulse (!) 55, temperature (!) 97 F (36.1 C), height _0  (1.778 m), weight 225 lb (102.1 kg).  Constitutional: overall normal hygiene, normal nutrition, well developed, normal grooming, normal body habitus. Assistive device:crutches, hinge knee brace  Musculoskeletal: gait and station Limp left, muscle tone and strength are normal, no tremors or atrophy is present.  .   Neurological: coordination overall normal.  Deep tendon reflex/nerve stretch intact.  Sensation normal.  Cranial nerves II-XII intact.   Skin:   Normal overall no scars, lesions, ulcers or rashes. No psoriasis.  Psychiatric: Alert and oriented x 3.  Recent memory intact, remote memory unclear.  Normal mood and affect. Well groomed.  Good eye contact.  Cardiovascular: overall no swelling, no varicosities, no edema bilaterally, normal temperatures of the legs and arms, no clubbing, cyanosis and good capillary refill.  Lymphatic: palpation is normal.  All other systems reviewed and are negative   Left knee has crepitus, slight instability, no effusion ROM 0 to 95 with some pain.  Limp to the left is present.  NV is intact.  The patient has been educated about the nature of the problem(s) and counseled on treatment options.  The patient appeared to understand what I have discussed and is in agreement with it.  Encounter Diagnoses  Name Primary?  . Chronic pain of both knees Yes  . Tobacco smoker within last 12 months     PLAN Call if any problems.  Precautions discussed.  Continue current medications.   Return to clinic 3 months   I have reviewed the [place] web site prior to prescribing narcotic medicine for this patient.  Electronically Signed Sanjuana Kava, MD 10/24/201811:04 AM

## 2017-02-08 DIAGNOSIS — M25562 Pain in left knee: Secondary | ICD-10-CM | POA: Diagnosis not present

## 2017-02-08 DIAGNOSIS — G8929 Other chronic pain: Secondary | ICD-10-CM | POA: Diagnosis not present

## 2017-02-08 DIAGNOSIS — Z96652 Presence of left artificial knee joint: Secondary | ICD-10-CM | POA: Diagnosis not present

## 2017-02-13 ENCOUNTER — Other Ambulatory Visit: Payer: Self-pay | Admitting: Orthopaedic Surgery

## 2017-02-26 ENCOUNTER — Telehealth: Payer: Self-pay | Admitting: Orthopaedic Surgery

## 2017-02-26 MED ORDER — OXYCODONE-ACETAMINOPHEN 7.5-325 MG PO TABS
1.0000 | ORAL_TABLET | ORAL | 0 refills | Status: DC | PRN
Start: 2017-02-26 — End: 2017-03-20

## 2017-03-20 ENCOUNTER — Other Ambulatory Visit: Payer: Self-pay | Admitting: Orthopaedic Surgery

## 2017-03-21 MED ORDER — OXYCODONE-ACETAMINOPHEN 7.5-325 MG PO TABS: 1.0000 | ORAL_TABLET | ORAL | 0 refills | Status: DC | PRN

## 2017-03-21 NOTE — Telephone Encounter (Signed)
Last written 02/26/17 Patient requests refill Oxycodone 7.5/325

## 2017-04-11 ENCOUNTER — Other Ambulatory Visit: Payer: Self-pay | Admitting: Orthopaedic Surgery

## 2017-04-11 MED ORDER — OXYCODONE-ACETAMINOPHEN 7.5-325 MG PO TABS: 1.0000 | ORAL_TABLET | ORAL | 0 refills | Status: DC | PRN

## 2017-04-19 ENCOUNTER — Other Ambulatory Visit (HOSPITAL_COMMUNITY): Payer: Self-pay | Admitting: Internal Medicine

## 2017-04-19 DIAGNOSIS — D696 Thrombocytopenia, unspecified: Secondary | ICD-10-CM | POA: Diagnosis not present

## 2017-04-19 DIAGNOSIS — Z720 Tobacco use: Secondary | ICD-10-CM | POA: Diagnosis not present

## 2017-04-19 DIAGNOSIS — R1011 Right upper quadrant pain: Secondary | ICD-10-CM | POA: Diagnosis not present

## 2017-04-19 DIAGNOSIS — G894 Chronic pain syndrome: Secondary | ICD-10-CM | POA: Diagnosis not present

## 2017-04-19 DIAGNOSIS — F339 Major depressive disorder, recurrent, unspecified: Secondary | ICD-10-CM | POA: Diagnosis not present

## 2017-04-19 DIAGNOSIS — F341 Dysthymic disorder: Secondary | ICD-10-CM | POA: Diagnosis not present

## 2017-04-19 DIAGNOSIS — Z6832 Body mass index (BMI) 32.0-32.9, adult: Secondary | ICD-10-CM | POA: Diagnosis not present

## 2017-04-19 DIAGNOSIS — M5417 Radiculopathy, lumbosacral region: Secondary | ICD-10-CM | POA: Diagnosis not present

## 2017-04-19 DIAGNOSIS — E119 Type 2 diabetes mellitus without complications: Secondary | ICD-10-CM | POA: Diagnosis not present

## 2017-04-19 DIAGNOSIS — I10 Essential (primary) hypertension: Secondary | ICD-10-CM | POA: Diagnosis not present

## 2017-04-19 DIAGNOSIS — E875 Hyperkalemia: Secondary | ICD-10-CM | POA: Diagnosis not present

## 2017-04-19 DIAGNOSIS — E782 Mixed hyperlipidemia: Secondary | ICD-10-CM | POA: Diagnosis not present

## 2017-04-19 DIAGNOSIS — F419 Anxiety disorder, unspecified: Secondary | ICD-10-CM | POA: Diagnosis not present

## 2017-04-19 DIAGNOSIS — G4700 Insomnia, unspecified: Secondary | ICD-10-CM | POA: Diagnosis not present

## 2017-05-01 ENCOUNTER — Encounter: Payer: Self-pay | Admitting: Orthopaedic Surgery

## 2017-05-01 ENCOUNTER — Ambulatory Visit: Payer: Medicare HMO | Admitting: Orthopaedic Surgery

## 2017-05-01 ENCOUNTER — Ambulatory Visit (HOSPITAL_COMMUNITY): Payer: Medicare HMO

## 2017-05-01 VITALS — BP 155/84 | HR 61 | Ht 70.0 in | Wt 218.0 lb

## 2017-05-01 DIAGNOSIS — M25562 Pain in left knee: Secondary | ICD-10-CM | POA: Diagnosis not present

## 2017-05-01 DIAGNOSIS — F17200 Nicotine dependence, unspecified, uncomplicated: Secondary | ICD-10-CM

## 2017-05-01 DIAGNOSIS — G8929 Other chronic pain: Secondary | ICD-10-CM | POA: Diagnosis not present

## 2017-05-01 DIAGNOSIS — M25561 Pain in right knee: Secondary | ICD-10-CM | POA: Diagnosis not present

## 2017-05-01 MED ORDER — OXYCODONE-ACETAMINOPHEN 7.5-325 MG PO TABS: 1.0000 | ORAL_TABLET | ORAL | 0 refills | Status: DC | PRN

## 2017-05-01 NOTE — Patient Instructions (Addendum)
Steps to Quit Smoking Smoking tobacco can be bad for your health. It can also affect almost every organ in your body. Smoking puts you and people around you at risk for many serious Chris Lopez-lasting (chronic) diseases. Quitting smoking is hard, but it is one of the best things that you can do for your health. It is never too late to quit. What are the benefits of quitting smoking? When you quit smoking, you lower your risk for getting serious diseases and conditions. They can include:  Lung cancer or lung disease.  Heart disease.  Stroke.  Heart attack.  Not being able to have children (infertility).  Weak bones (osteoporosis) and broken bones (fractures).  If you have coughing, wheezing, and shortness of breath, those symptoms may get better when you quit. You may also get sick less often. If you are pregnant, quitting smoking can help to lower your chances of having a baby of low birth weight. What can I do to help me quit smoking? Talk with your doctor about what can help you quit smoking. Some things you can do (strategies) include:  Quitting smoking totally, instead of slowly cutting back how much you smoke over a period of time.  Going to in-person counseling. You are more likely to quit if you go to many counseling sessions.  Using resources and support systems, such as: ? Online chats with a counselor. ? Phone quitlines. ? Printed self-help materials. ? Support groups or group counseling. ? Text messaging programs. ? Mobile phone apps or applications.  Taking medicines. Some of these medicines may have nicotine in them. If you are pregnant or breastfeeding, do not take any medicines to quit smoking unless your doctor says it is okay. Talk with your doctor about counseling or other things that can help you.  Talk with your doctor about using more than one strategy at the same time, such as taking medicines while you are also going to in-person counseling. This can help make  quitting easier. What things can I do to make it easier to quit? Quitting smoking might feel very hard at first, but there is a lot that you can do to make it easier. Take these steps:  Talk to your family and friends. Ask them to support and encourage you.  Call phone quitlines, reach out to support groups, or work with a counselor.  Ask people who smoke to not smoke around you.  Avoid places that make you want (trigger) to smoke, such as: ? Bars. ? Parties. ? Smoke-break areas at work.  Spend time with people who do not smoke.  Lower the stress in your life. Stress can make you want to smoke. Try these things to help your stress: ? Getting regular exercise. ? Deep-breathing exercises. ? Yoga. ? Meditating. ? Doing a body scan. To do this, close your eyes, focus on one area of your body at a time from head to toe, and notice which parts of your body are tense. Try to relax the muscles in those areas.  Download or buy apps on your mobile phone or tablet that can help you stick to your quit plan. There are many free apps, such as QuitGuide from the CDC (Centers for Disease Control and Prevention). You can find more support from smokefree.gov and other websites.  This information is not intended to replace advice given to you by your health care provider. Make sure you discuss any questions you have with your health care provider. Document Released: 01/20/2009 Document   Revised: 11/22/2015 Document Reviewed: 08/10/2014 Elsevier Interactive Patient Education  2018 Elsevier Inc.  

## 2017-05-01 NOTE — Progress Notes (Signed)
Patient NI:DPOE Chris Lopez, male DOB:05/10/1945, 72 y.o. UMP:[id number]  Chief Complaint  Patient presents with  . Follow-up    Bilateral knee pain left worse than right    HPI  Chris Lopez is a 72 y.o. male who has continued pain of both knees, more on the left.  He has bilateral total knees.  He has been seen at [facility] and they told him the left knee has had so many revisions already that no further surgery would be of benefit.  He has pain, chronic that has episodes of marked pain. They told him to be careful, use his cane and brace and take the pain medicine from me.  He has no new trauma.  He hurts and limps to the left. HPI  Body mass index is 31.28 kg/m.  ROS  Review of Systems  HENT: Negative for congestion.   Respiratory: Positive for shortness of breath. Negative for cough.   Cardiovascular: Negative for chest pain and leg swelling.  Endocrine: Positive for cold intolerance.  Musculoskeletal: Positive for arthralgias, back pain, gait problem and myalgias.  Allergic/Immunologic: Positive for environmental allergies.  All other systems reviewed and are negative.   Past Medical History:  Diagnosis Date  . Anxiety   . BPH (benign prostatic hyperplasia)   . Chronic bronchitis   . COPD (chronic obstructive pulmonary disease) ([place])   . Depression   . Diabetes mellitus without complication ([place])   . Hypertension     Past Surgical History:  Procedure Laterality Date  . carpal tunnel right hand    . CERVICAL FUSION    . left knee replacement    . left knee revised X 3    . multiple knee arthroscopies    . right knee replacement    . tarsal tunnel repair    . THYROIDECTOMY, PARTIAL    . TONSILLECTOMY      Family History  Problem Relation Age of Onset  . Colon cancer Neg Hx     Social History Social History   Tobacco Use  . Smoking status: Former Smoker    Packs/day: 1.00    Years: 40.00    Pack years: 40.00    Types: Cigarettes    Last  attempt to quit: 06/29/2010    Years since quitting: 6.8  . Smokeless tobacco: Never Used  . Tobacco comment: using electronic cigarettes  Substance Use Topics  . Alcohol use: No  . Drug use: No    Allergies  Allergen Reactions  . Bee Venom Anaphylaxis    Allergic to bee stings not the kit  . Feldene [Piroxicam] Rash and Dermatitis    Current Outpatient Medications  Medication Sig Dispense Refill  . aspirin 325 MG tablet Take 325 mg by mouth daily.     Marland Kitchen atorvastatin (LIPITOR) 20 MG tablet Take 20 mg by mouth daily. Reported on 06/07/2015    . diazepam (VALIUM) 5 MG tablet TAKE 1 TABLET BY MOUTH EVERY 12 HOURS AS NEEDED FOR SPASMS 40 tablet 1  . diclofenac (VOLTAREN) 75 MG EC tablet TAKE 1 TABLET TWICE DAILY 180 tablet 5  . gabapentin (NEURONTIN) 300 MG capsule Take 300 mg by mouth 3 (three) times daily. Reported on 06/07/2015    . LORazepam (ATIVAN) 1 MG tablet Take 1 mg by mouth at bedtime. Reported on 06/07/2015    . losartan (COZAAR) 100 MG tablet Take 100 mg by mouth daily.    . metFORMIN (GLUCOPHAGE) 500 MG tablet Take 500 mg by  mouth 2 (two) times daily. Reported on 06/07/2015    . metoprolol (LOPRESSOR) 50 MG tablet Take 50 mg by mouth 2 (two) times daily.     . nitroGLYCERIN (NITROSTAT) 0.4 MG SL tablet Place 0.4 mg under the tongue every 5 (five) minutes x 3 doses as needed. For chest pain    . omeprazole (PRILOSEC OTC) 20 MG tablet Take 20 mg by mouth daily. Reported on 06/07/2015    . oxyCODONE-acetaminophen (PERCOCET) 7.5-325 MG tablet Take 1 tablet by mouth every 4 (four) hours as needed for moderate pain or severe pain (Must last 21 days). 90 tablet 0  . Tamsulosin HCl (FLOMAX) 0.4 MG CAPS Take 0.4 mg by mouth daily. Reported on 06/07/2015    . venlafaxine (EFFEXOR-XR) 150 MG 24 hr capsule Take 150 mg by mouth daily.       No current facility-administered medications for this visit.      Physical Exam  Blood pressure (!) 155/84, pulse 61, height 5' 10"  (1.778 m), weight  218 lb (98.9 kg).  Constitutional: overall normal hygiene, normal nutrition, well developed, normal grooming, normal body habitus. Assistive device:cane  Musculoskeletal: gait and station Limp left, muscle tone and strength are normal, no tremors or atrophy is present.  .  Neurological: coordination overall normal.  Deep tendon reflex/nerve stretch intact.  Sensation normal.  Cranial nerves II-XII intact.   Skin:   Normal overall no scars, lesions, ulcers or rashes. No psoriasis.  Psychiatric: Alert and oriented x 3.  Recent memory intact, remote memory unclear.  Normal mood and affect. Well groomed.  Good eye contact.  Cardiovascular: overall no swelling, no varicosities, no edema bilaterally, normal temperatures of the legs and arms, no clubbing, cyanosis and good capillary refill.  Lymphatic: palpation is normal.  Left knee has crepitus, atrophy of the quads, ROM 0 to 90 with pain, limp left, scars but no obvious deformity.  All other systems reviewed and are negative   The patient has been educated about the nature of the problem(s) and counseled on treatment options.  The patient appeared to understand what I have discussed and is in agreement with it.  Encounter Diagnoses  Name Primary?  . Chronic pain of both knees Yes  . Tobacco smoker within last 12 months     PLAN Call if any problems.  Precautions discussed.  Continue current medications.   Return to clinic 5 weeks   I have reviewed the [place] web site prior to prescribing narcotic medicine for this patient.  Electronically Signed Sanjuana Kava, MD 1/23/201910:36 AM

## 2017-05-14 ENCOUNTER — Ambulatory Visit (HOSPITAL_COMMUNITY)
Admission: RE | Admit: 2017-05-14 | Discharge: 2017-05-14 | Disposition: A | Discharge status: Home / Self Care | Payer: Medicare HMO | Source: Ambulatory Visit | Attending: Internal Medicine | Admitting: Internal Medicine

## 2017-05-14 DIAGNOSIS — I700 Atherosclerosis of aorta: Secondary | ICD-10-CM | POA: Insufficient documentation

## 2017-05-14 DIAGNOSIS — R1011 Right upper quadrant pain: Secondary | ICD-10-CM

## 2017-05-14 DIAGNOSIS — N400 Enlarged prostate without lower urinary tract symptoms: Secondary | ICD-10-CM | POA: Insufficient documentation

## 2017-05-14 DIAGNOSIS — K5730 Diverticulosis of large intestine without perforation or abscess without bleeding: Secondary | ICD-10-CM | POA: Diagnosis not present

## 2017-05-14 LAB — POCT I-STAT CREATININE: Creatinine, Ser: 1 mg/dL (ref 0.61–1.24)

## 2017-05-14 MED ORDER — IOPAMIDOL (ISOVUE-300) INJECTION 61%
100.0000 mL | Freq: Once | INTRAVENOUS | Status: AC | PRN
  Administered 2017-05-14: 100 mL via INTRAVENOUS

## 2017-05-24 ENCOUNTER — Encounter: Payer: Self-pay | Admitting: Internal Medicine

## 2017-05-27 ENCOUNTER — Other Ambulatory Visit: Payer: Self-pay | Admitting: Orthopaedic Surgery

## 2017-05-28 MED ORDER — OXYCODONE-ACETAMINOPHEN 7.5-325 MG PO TABS: 1.0000 | ORAL_TABLET | ORAL | 0 refills | Status: DC | PRN

## 2017-06-05 ENCOUNTER — Encounter: Payer: Self-pay | Admitting: Orthopaedic Surgery

## 2017-06-05 ENCOUNTER — Ambulatory Visit: Payer: Medicare HMO | Admitting: Orthopaedic Surgery

## 2017-06-05 VITALS — BP 137/79 | HR 70 | Temp 97.0°F | Ht 70.0 in | Wt 217.0 lb

## 2017-06-05 DIAGNOSIS — M25561 Pain in right knee: Secondary | ICD-10-CM

## 2017-06-05 DIAGNOSIS — M25562 Pain in left knee: Secondary | ICD-10-CM

## 2017-06-05 DIAGNOSIS — F17200 Nicotine dependence, unspecified, uncomplicated: Secondary | ICD-10-CM

## 2017-06-05 DIAGNOSIS — G8929 Other chronic pain: Secondary | ICD-10-CM

## 2017-06-05 NOTE — Progress Notes (Signed)
Patient NF:AOZH Chris Lopez, male DOB:11-28-1945, 72 y.o. YQM:[id number]  Chief Complaint  Patient presents with  . Knee Pain    bilateral    HPI  Chris Lopez is a 72 y.o. male who has bilateral knee pain post bilateral total knees.  He has had several revisions done on the left knee and has been followed by [facility] for this.  He uses a cane.  He has more pain with cold weather of the proximal tibia.  He has instability of the left knee and has been told there is no further surgery that will help.  He has more pain in the cold weather.  He has spasm at times of the left lower leg.  He has no new trauma. HPI  Body mass index is 31.14 kg/m.  ROS  Review of Systems  HENT: Negative for congestion.   Respiratory: Positive for shortness of breath. Negative for cough.   Cardiovascular: Negative for chest pain and leg swelling.  Endocrine: Positive for cold intolerance.  Musculoskeletal: Positive for arthralgias, back pain, gait problem and myalgias.  Allergic/Immunologic: Positive for environmental allergies.  All other systems reviewed and are negative.   Past Medical History:  Diagnosis Date  . Anxiety   . BPH (benign prostatic hyperplasia)   . Chronic bronchitis   . COPD (chronic obstructive pulmonary disease) ([place])   . Depression   . Diabetes mellitus without complication ([place])   . Hypertension     Past Surgical History:  Procedure Laterality Date  . carpal tunnel right hand    . CERVICAL FUSION    . left knee replacement    . left knee revised X 3    . multiple knee arthroscopies    . right knee replacement    . tarsal tunnel repair    . THYROIDECTOMY, PARTIAL    . TONSILLECTOMY      Family History  Problem Relation Age of Onset  . Colon cancer Neg Hx     Social History Social History   Tobacco Use  . Smoking status: Former Smoker    Packs/day: 1.00    Years: 40.00    Pack years: 40.00    Types: Cigarettes    Last attempt to quit: 06/29/2010     Years since quitting: 6.9  . Smokeless tobacco: Never Used  . Tobacco comment: using electronic cigarettes  Substance Use Topics  . Alcohol use: No  . Drug use: No    Allergies  Allergen Reactions  . Bee Venom Anaphylaxis    Allergic to bee stings not the kit  . Feldene [Piroxicam] Rash and Dermatitis    Current Outpatient Medications  Medication Sig Dispense Refill  . aspirin 325 MG tablet Take 325 mg by mouth daily.     Marland Kitchen atorvastatin (LIPITOR) 20 MG tablet Take 20 mg by mouth daily. Reported on 06/07/2015    . diazepam (VALIUM) 5 MG tablet TAKE 1 TABLET BY MOUTH EVERY 12 HOURS AS NEEDED FOR SPASMS 40 tablet 1  . diclofenac (VOLTAREN) 75 MG EC tablet TAKE 1 TABLET TWICE DAILY 180 tablet 5  . gabapentin (NEURONTIN) 300 MG capsule Take 300 mg by mouth 3 (three) times daily. Reported on 06/07/2015    . LORazepam (ATIVAN) 1 MG tablet Take 1 mg by mouth at bedtime. Reported on 06/07/2015    . losartan (COZAAR) 100 MG tablet Take 100 mg by mouth daily.    . metFORMIN (GLUCOPHAGE) 500 MG tablet Take 500 mg by mouth 2 (  two) times daily. Reported on 06/07/2015    . metoprolol (LOPRESSOR) 50 MG tablet Take 50 mg by mouth 2 (two) times daily.     . nitroGLYCERIN (NITROSTAT) 0.4 MG SL tablet Place 0.4 mg under the tongue every 5 (five) minutes x 3 doses as needed. For chest pain    . omeprazole (PRILOSEC OTC) 20 MG tablet Take 20 mg by mouth daily. Reported on 06/07/2015    . oxyCODONE-acetaminophen (PERCOCET) 7.5-325 MG tablet Take 1 tablet by mouth every 4 (four) hours as needed for moderate pain or severe pain (Must last 21 days). 90 tablet 0  . Tamsulosin HCl (FLOMAX) 0.4 MG CAPS Take 0.4 mg by mouth daily. Reported on 06/07/2015    . venlafaxine (EFFEXOR-XR) 150 MG 24 hr capsule Take 150 mg by mouth daily.       No current facility-administered medications for this visit.      Physical Exam  Blood pressure 137/79, pulse 70, temperature (!) 97 F (36.1 C), height 5' 10"  (1.778 m),  weight 217 lb (98.4 kg).  Constitutional: overall normal hygiene, normal nutrition, well developed, normal grooming, normal body habitus. Assistive device:cane  Musculoskeletal: gait and station Limp left, muscle tone and strength are normal, no tremors or atrophy is present.  .  Neurological: coordination overall normal.  Deep tendon reflex/nerve stretch intact.  Sensation normal.  Cranial nerves II-XII intact.   Skin:   Normal overall no scars, lesions, ulcers or rashes. No psoriasis.  Psychiatric: Alert and oriented x 3.  Recent memory intact, remote memory unclear.  Normal mood and affect. Well groomed.  Good eye contact.  Cardiovascular: overall no swelling, no varicosities, no edema bilaterally, normal temperatures of the legs and arms, no clubbing, cyanosis and good capillary refill.  Lymphatic: palpation is normal.  He has scars around both knees, the left knee has ROM of 0 to 100, the right of 0 to 115.  The left knee is painful in ROM, the right is not.  He has no effusion.  He limps to the left.  NV intact.  All other systems reviewed and are negative   The patient has been educated about the nature of the problem(s) and counseled on treatment options.  The patient appeared to understand what I have discussed and is in agreement with it.  Encounter Diagnoses  Name Primary?  . Chronic pain of both knees Yes  . Tobacco smoker within last 12 months     PLAN Call if any problems.  Precautions discussed.  Continue current medications.   Return to clinic 3 months   Electronically Signed Sanjuana Kava, MD 2/27/201910:14 AM

## 2017-06-05 NOTE — Patient Instructions (Signed)
Steps to Quit Smoking Smoking tobacco can be bad for your health. It can also affect almost every organ in your body. Smoking puts you and people around you at risk for many serious long-lasting (chronic) diseases. Quitting smoking is hard, but it is one of the best things that you can do for your health. It is never too late to quit. What are the benefits of quitting smoking? When you quit smoking, you lower your risk for getting serious diseases and conditions. They can include:  Lung cancer or lung disease.  Heart disease.  Stroke.  Heart attack.  Not being able to have children (infertility).  Weak bones (osteoporosis) and broken bones (fractures).  If you have coughing, wheezing, and shortness of breath, those symptoms may get better when you quit. You may also get sick less often. If you are pregnant, quitting smoking can help to lower your chances of having a baby of low birth weight. What can I do to help me quit smoking? Talk with your doctor about what can help you quit smoking. Some things you can do (strategies) include:  Quitting smoking totally, instead of slowly cutting back how much you smoke over a period of time.  Going to in-person counseling. You are more likely to quit if you go to many counseling sessions.  Using resources and support systems, such as: ? Online chats with a counselor. ? Phone quitlines. ? Printed self-help materials. ? Support groups or group counseling. ? Text messaging programs. ? Mobile phone apps or applications.  Taking medicines. Some of these medicines may have nicotine in them. If you are pregnant or breastfeeding, do not take any medicines to quit smoking unless your doctor says it is okay. Talk with your doctor about counseling or other things that can help you.  Talk with your doctor about using more than one strategy at the same time, such as taking medicines while you are also going to in-person counseling. This can help make  quitting easier. What things can I do to make it easier to quit? Quitting smoking might feel very hard at first, but there is a lot that you can do to make it easier. Take these steps:  Talk to your family and friends. Ask them to support and encourage you.  Call phone quitlines, reach out to support groups, or work with a counselor.  Ask people who smoke to not smoke around you.  Avoid places that make you want (trigger) to smoke, such as: ? Bars. ? Parties. ? Smoke-break areas at work.  Spend time with people who do not smoke.  Lower the stress in your life. Stress can make you want to smoke. Try these things to help your stress: ? Getting regular exercise. ? Deep-breathing exercises. ? Yoga. ? Meditating. ? Doing a body scan. To do this, close your eyes, focus on one area of your body at a time from head to toe, and notice which parts of your body are tense. Try to relax the muscles in those areas.  Download or buy apps on your mobile phone or tablet that can help you stick to your quit plan. There are many free apps, such as QuitGuide from the CDC (Centers for Disease Control and Prevention). You can find more support from smokefree.gov and other websites.  This information is not intended to replace advice given to you by your health care provider. Make sure you discuss any questions you have with your health care provider. Document Released: 01/20/2009 Document   Revised: 11/22/2015 Document Reviewed: 08/10/2014 Elsevier Interactive Patient Education  2018 Elsevier Inc.  

## 2017-06-18 ENCOUNTER — Other Ambulatory Visit: Payer: Self-pay | Admitting: Orthopaedic Surgery

## 2017-06-19 MED ORDER — OXYCODONE-ACETAMINOPHEN 7.5-325 MG PO TABS: 1.0000 | ORAL_TABLET | ORAL | 0 refills | Status: DC | PRN

## 2017-07-10 ENCOUNTER — Other Ambulatory Visit: Payer: Self-pay | Admitting: Orthopedic Surgery

## 2017-07-10 MED ORDER — OXYCODONE-ACETAMINOPHEN 7.5-325 MG PO TABS: 1.0000 | ORAL_TABLET | ORAL | 0 refills | Status: DC | PRN

## 2017-07-24 ENCOUNTER — Ambulatory Visit: Payer: Medicare HMO | Admitting: Nurse Practitioner

## 2017-07-24 ENCOUNTER — Encounter: Payer: Self-pay | Admitting: Nurse Practitioner

## 2017-07-24 ENCOUNTER — Telehealth: Payer: Self-pay

## 2017-07-24 DIAGNOSIS — R1011 Right upper quadrant pain: Secondary | ICD-10-CM

## 2017-07-24 DIAGNOSIS — R1031 Right lower quadrant pain: Secondary | ICD-10-CM | POA: Diagnosis not present

## 2017-07-24 DIAGNOSIS — K219 Gastro-esophageal reflux disease without esophagitis: Secondary | ICD-10-CM | POA: Insufficient documentation

## 2017-07-24 DIAGNOSIS — K5900 Constipation, unspecified: Secondary | ICD-10-CM

## 2017-07-24 NOTE — Patient Instructions (Signed)
1. We will schedule your colonoscopy for you. 2. Start taking MiraLAX 1-2 times a day, as needed for constipation. 3. Call us and let us know if it is not helping. 4. Follow-up in 2 months. 5. Call us if you have any questions or concerns.   It was great to meet you today!  Have a wonderful start to summer!!!    At [facility] we value your feedback. You may receive a survey about your visit today. Please share your experience as we strive to create trusting relationships with our patients to provide genuine, compassionate, quality care.

## 2017-07-24 NOTE — Telephone Encounter (Signed)
Tried to call pt to schedule TCS/EGD w/Propofol w/RMR, no answer, LMOVM for return call. 

## 2017-07-24 NOTE — Progress Notes (Signed)
Primary Care Physician:  Celene Squibb, MD Primary Gastroenterologist:  Dr. Gala Romney  Chief Complaint  Patient presents with  . Abdominal Pain    rlq x several months, comes/goes    HPI:   Chris Lopez is a 72 y.o. male who presents on referral from primary care for right lower quadrant pain.  Documentation received with referral was reviewed including office visit note dated 04/19/2017.  At that time the patient presented to PCP for abdominal pain for 2 months which are constant, sharp, associated with nausea and vomiting, constipation, and diarrhea.  Taking "over-the-counter stomach pill twice a day and some over-the-counter laxatives."  Per PCP notes, last colonoscopy 2017.  History of iron deficiency anemia.  Noted right upper quadrant pain on exam query gallbladder versus gastric ulcer versus duodenal ulcer.  Labs and CT imaging were ordered.  Labs drawn 04/19/2017 included CBC which was normal, specifically hemoglobin 13.4.  CMP with mildly elevated creatinine at 1.21 though he does have a history of chronic kidney disease.  Lipase and amylase were both normal.  CT of the abdomen and pelvis with contrast was completed on 05/14/2017.  Findings include prominent appendix very suspicious for developing acute appendicitis but no adjacent signs indicate inflammatory process, multiple rectosigmoid colon diverticula, prostate enlargement, distal esophageal fluid query stricture versus dysmotility.  Normal pancreas and hepatobiliary system noted.  Colonoscopy found in our system dated 08/07/2010 which included diminutive rectal polyp, left-sided diverticula, otherwise normal.  Polyp was found to be tubular adenoma. EGD completed the same day found distal esophageal erosions consistent with erosive reflux esophagitis status post biopsy, small hiatal hernia.  Biopsies were found to be minimal chronic inflammation without H. Pylori.  Recommended repeat colonoscopy exam in 2017.  Today he states he's  doing ok overall. Has RLQ pain intermittently described as an ache/dull pain; occurs and lasts 4-6 days, then abates a couple/few days; sometimes the pain is significant. He is on chronic pain medication. Cannot identify triggers or alleviating factors. RUQ pain hasn't occurred in about 1+ months. It was right under his ribs, associated with swelling. Seems to have resolved. Pain was significant at times. No known triggers. The RUQ pain was constant at the time. Has intermittent/rare GERD symptoms. Is on OTC Prilosec; also on daily ASA 325. Denies other abdominal. Has variable stools, swings from constipation to diarrhea which he attributes to pain medications; doesn't take anything because constipation isn't severe and only lasts 5 days. Denies N/V, fever, chills. Has had unintentional weight loss of 30-ish pounds int he last 6 months. Last colonoscopy in 2012 (he thought it was last year but has never had one done anywhere but here with Dr. Gala Romney.) Denies hematochezia, melena (although has had "real dark brown stools). Denies chest pain, dyspnea, dizziness, lightheadedness, syncope, near syncope. Denies any other upper or lower GI symptoms.  Past Medical History:  Diagnosis Date  . Anxiety   . BPH (benign prostatic hyperplasia)   . Chronic bronchitis   . COPD (chronic obstructive pulmonary disease) ([place])   . Depression   . Diabetes mellitus without complication ([place])    Resolved; not needing medications at this time.  . Hypertension     Past Surgical History:  Procedure Laterality Date  . carpal tunnel right hand    . CERVICAL FUSION    . left knee replacement    . left knee revised X 3    . multiple knee arthroscopies    . right knee replacement    .  tarsal tunnel repair    . THYROIDECTOMY, PARTIAL    . TONSILLECTOMY      Current Outpatient Medications  Medication Sig Dispense Refill  . aspirin 325 MG tablet Take 325 mg by mouth daily.     Marland Kitchen atorvastatin (LIPITOR) 20 MG tablet Take  20 mg by mouth daily. Reported on 06/07/2015    . diclofenac (VOLTAREN) 75 MG EC tablet TAKE 1 TABLET TWICE DAILY 180 tablet 5  . gabapentin (NEURONTIN) 300 MG capsule Take 300 mg by mouth 3 (three) times daily. Reported on 06/07/2015    . LORazepam (ATIVAN) 1 MG tablet Take 2 mg by mouth at bedtime. Reported on 06/07/2015    . losartan (COZAAR) 100 MG tablet Take 100 mg by mouth daily.    . metFORMIN (GLUCOPHAGE) 500 MG tablet Take 500 mg by mouth 2 (two) times daily. Reported on 06/07/2015    . metoprolol (LOPRESSOR) 50 MG tablet Take 50 mg by mouth daily.     . nitroGLYCERIN (NITROSTAT) 0.4 MG SL tablet Place 0.4 mg under the tongue every 5 (five) minutes x 3 doses as needed. For chest pain    . omeprazole (PRILOSEC OTC) 20 MG tablet Take 20 mg by mouth daily. Reported on 06/07/2015    . oxyCODONE-acetaminophen (PERCOCET) 10-325 MG tablet Take 1 tablet by mouth every 4 (four) hours as needed for pain.    . Tamsulosin HCl (FLOMAX) 0.4 MG CAPS Take 0.4 mg by mouth daily. Reported on 06/07/2015    . venlafaxine (EFFEXOR-XR) 150 MG 24 hr capsule Take 150 mg by mouth daily.       No current facility-administered medications for this visit.     Allergies as of 07/24/2017 - Review Complete 07/24/2017  Allergen Reaction Noted  . Bee venom Anaphylaxis 06/07/2015  . Feldene [piroxicam] Rash and Dermatitis 07/20/2010    Family History  Problem Relation Age of Onset  . Colon cancer Maternal Grandmother   . Colon cancer Paternal Grandmother     Social History   Socioeconomic History  . Marital status: Married    Spouse name: Not on file  . Number of children: Not on file  . Years of education: Not on file  . Highest education level: Not on file  Occupational History  . Not on file  Social Needs  . Financial resource strain: Not on file  . Food insecurity:    Worry: Not on file    Inability: Not on file  . Transportation needs:    Medical: Not on file    Non-medical: Not on file  Tobacco  Use  . Smoking status: Current Every Day Smoker    Packs/day: 1.00    Years: 40.00    Pack years: 40.00    Types: Cigarettes  . Smokeless tobacco: Never Used  Substance and Sexual Activity  . Alcohol use: No  . Drug use: No  . Sexual activity: Yes    Partners: Female    Birth control/protection: None    Comment: spouse  Lifestyle  . Physical activity:    Days per week: Not on file    Minutes per session: Not on file  . Stress: Not on file  Relationships  . Social connections:    Talks on phone: Not on file    Gets together: Not on file    Attends religious service: Not on file    Active member of club or organization: Not on file    Attends meetings of clubs or organizations: Not  on file    Relationship status: Not on file  . Intimate partner violence:    Fear of current or ex partner: Not on file    Emotionally abused: Not on file    Physically abused: Not on file    Forced sexual activity: Not on file  Other Topics Concern  . Not on file  Social History Narrative  . Not on file    Review of Systems: General: Negative for anorexia, weight loss, fever, chills, fatigue, weakness. ENT: Negative for hoarseness, difficulty swallowing. CV: Negative for chest pain, angina, palpitations, peripheral edema.  Respiratory: Negative for dyspnea at rest, cough, sputum, wheezing.  GI: See history of present illness. MS: Admits chronic pain.  Derm: Negative for rash or itching.  Endo: Negative for unusual weight change.  Heme: Negative for bruising or bleeding. Allergy: Negative for rash or hives.    Physical Exam: BP 132/75   Pulse 62   Temp (!) 97.1 F (36.2 C) (Oral)   Ht 6' (1.829 m)   Wt 218 lb 9.6 oz (99.2 kg)   BMI 29.65 kg/m  General:   Alert and oriented. Pleasant and cooperative. Well-nourished and well-developed.  Eyes:  Without icterus, sclera clear and conjunctiva pink.  Ears:  Normal auditory acuity. Mouth:  No deformity or lesions, oral mucosa pink.    Throat/Neck:  Supple, without mass or thyromegaly. Cardiovascular:  S1, S2 present without murmurs appreciated. Extremities without clubbing or edema. Respiratory:  Clear to auscultation bilaterally. No wheezes, rales, or rhonchi. No distress.  Gastrointestinal:  +BS, soft, non-tender and non-distended. No HSM noted. No guarding or rebound. No masses appreciated.  Rectal:  Deferred  Musculoskalatal:  Symmetrical without gross deformities. Neurologic:  Alert and oriented x4;  grossly normal neurologically. Psych:  Alert and cooperative. Normal mood and affect. Heme/Lymph/Immune: No excessive bruising noted.    07/24/2017 8:29 AM   Disclaimer: This note was dictated with voice recognition software. Similar sounding words can inadvertently be transcribed and may not be corrected upon review.

## 2017-07-25 NOTE — Telephone Encounter (Signed)
Tried to call pt, no answer, LMOVM. 

## 2017-07-25 NOTE — Assessment & Plan Note (Signed)
Patient has constipation which she feels is not bad.  She is not wanting prescription medicines at this time.  I will have him start MiraLAX 1-2 times a day, as needed for constipation.  We will see how this affects his right lower quadrant abdominal pain.  Further workup pending clinical response.

## 2017-07-25 NOTE — Assessment & Plan Note (Addendum)
Intermittent right lower quadrant pain described as a dull ache which tends to last for on average 5 days.  Interestingly, he has intermittent constipation which typically last 5 days.  He is on chronic pain medications.  No identified triggers.  At this point I think we should try to better control his constipation and see how this affects his right lower quadrant abdominal pain.  If his pain persist despite improved bowel movements we can consider further workup.  Interestingly, he did have a CT of the abdomen and pelvis which showed prominent appendix for possible developing acute appendicitis.  Given his intermittent pain he could also have an element of chronic pancreatitis.  When he follows up in 2 months if his pain is persistent we can also consider referral to surgery for evaluation.  ER precautions were given related to abdominal pain.  Somewhat unrelated, the patient is currently due for repeat colonoscopy.  He thought his colonoscopy was 1 or 2 years ago.  It was last completed in 2012.  Recommended repeat exam in 2017 (5 years) and is currently overdue.  We will proceed at this time.  Proceed with TCS on propofol/MAC with Dr. Gala Romney in near future: the risks, benefits, and alternatives have been discussed with the patient in detail. The patient states understanding and desires to proceed.  The patient is currently on Neurontin, Ativan, oxycodone, Effexor.  No other anticoagulants, anxiolytics, chronic pain medications, or antidepressants.  We will plan for the procedure on propofol/MAC to promote adequate sedation.

## 2017-07-25 NOTE — Assessment & Plan Note (Signed)
Intermittent/rare breakthrough GERD symptoms.  Currently on Prilosec.  Recommend he continue to take his PPI.  Follow-up in 2 months.

## 2017-07-25 NOTE — Assessment & Plan Note (Signed)
Previously had right upper quadrant pain but this is now resolved.  Recommend he continue to monitor, if his pain returns we can proceed with further workup including possible biliary etiology.  Follow-up in 2 months.

## 2017-07-29 ENCOUNTER — Other Ambulatory Visit: Payer: Self-pay

## 2017-07-29 DIAGNOSIS — K5900 Constipation, unspecified: Secondary | ICD-10-CM

## 2017-07-29 DIAGNOSIS — R1031 Right lower quadrant pain: Secondary | ICD-10-CM

## 2017-07-29 DIAGNOSIS — R1011 Right upper quadrant pain: Secondary | ICD-10-CM

## 2017-07-29 DIAGNOSIS — K219 Gastro-esophageal reflux disease without esophagitis: Secondary | ICD-10-CM

## 2017-07-29 MED ORDER — PEG 3350-KCL-NA BICARB-NACL 420 G PO SOLR: 4000.0000 mL | ORAL | 0 refills | Status: DC

## 2017-07-29 NOTE — Telephone Encounter (Signed)
Called and informed pt of pre-op appt 09/30/17 at 10:00am. Letter mailed with procedure instructions.

## 2017-07-29 NOTE — Progress Notes (Signed)
CC'ED TO PCP 

## 2017-07-29 NOTE — Telephone Encounter (Signed)
Pt called office. TCS/-/+EGD with Propofol w/RMR scheduled for 10/07/17 at 10:00am. Pt states he doesn't take any diabetic medications. Orders entered. Rx for prep sent to pharmacy. Instructions mailed.

## 2017-07-31 ENCOUNTER — Encounter: Payer: Self-pay | Admitting: Orthopaedic Surgery

## 2017-07-31 ENCOUNTER — Other Ambulatory Visit: Payer: Self-pay | Admitting: Orthopaedic Surgery

## 2017-07-31 ENCOUNTER — Telehealth: Payer: Self-pay | Admitting: Internal Medicine

## 2017-07-31 NOTE — Telephone Encounter (Signed)
Noted  

## 2017-07-31 NOTE — Telephone Encounter (Signed)
When patient was seen in the office he had a list of medications and he had wrote down oxycodone 10-325 mg.   I called [facility] to verify what they have been filling for pt. He last received oxycodone 7.5-325 mg on 07/10/17. Med list updated (in medications tab of phone note).

## 2017-07-31 NOTE — Telephone Encounter (Signed)
Spoke with pt and he isn't able to get his medication refilled due to our records of pts medication history on 07/24/17. Pt asked me to call Dr. Brooke Bonito office and ask to speak to [place].  Spoke with Inez Catalina and she said Dr. Kenton Kingfisher is doing the refills at the office since Dr. Luna Glasgow is out of the office and he will not refill the pts medication due to our medication history. Percocet/Oxycodone should be written for 7.5-325. Pt also gets the Diazepam 5 mg 1 tab po q 12hrs for spasms. Spoke with Mr.  They said if our office goes back in and corrects the mg of pts pain pills, they will refill his medication.

## 2017-07-31 NOTE — Telephone Encounter (Signed)
Pt and Chris Lopez notified of the updated changes.

## 2017-07-31 NOTE — Telephone Encounter (Signed)
Oxycodone-Acetaminophen  7.5/325 mg  Qty 90 Tablets  Take 1 tablet by mouth every 4 (four) hours as needed for moderate pain or severe pain (Must last 21 days)  PATIENT USES [facility] IN [facility]

## 2017-07-31 NOTE — Telephone Encounter (Signed)
Patient called stating that eric messed up his pain pill prescription.  [phone] dr Luna Glasgow stated that he was getting a pain prescription from [place]

## 2017-08-01 NOTE — Telephone Encounter (Signed)
7.5 mg Rx comes from Dr Luna Glasgow not 10mg  Percocet #90 Derrek Monaco advise.

## 2017-08-02 MED ORDER — OXYCODONE-ACETAMINOPHEN 7.5-325 MG PO TABS: 1.0000 | ORAL_TABLET | ORAL | 0 refills | Status: DC | PRN

## 2017-08-02 NOTE — Telephone Encounter (Signed)
ok 

## 2017-08-21 ENCOUNTER — Other Ambulatory Visit: Payer: Self-pay | Admitting: Orthopedic Surgery

## 2017-08-21 MED ORDER — OXYCODONE-ACETAMINOPHEN 7.5-325 MG PO TABS: 1.0000 | ORAL_TABLET | ORAL | 0 refills | Status: DC | PRN

## 2017-08-21 NOTE — Telephone Encounter (Signed)
Patient aware that since Dr Luna Glasgow has returned to clinic, routing request.  Patient aware also of appointment scheduled 09/03/17.

## 2017-09-03 ENCOUNTER — Encounter: Payer: Self-pay | Admitting: Orthopaedic Surgery

## 2017-09-03 ENCOUNTER — Ambulatory Visit: Payer: Medicare HMO | Admitting: Orthopaedic Surgery

## 2017-09-03 VITALS — BP 119/69 | HR 61 | Temp 97.8°F | Ht 72.0 in | Wt 215.0 lb

## 2017-09-03 DIAGNOSIS — M25561 Pain in right knee: Secondary | ICD-10-CM

## 2017-09-03 DIAGNOSIS — G8929 Other chronic pain: Secondary | ICD-10-CM | POA: Diagnosis not present

## 2017-09-03 DIAGNOSIS — M25562 Pain in left knee: Secondary | ICD-10-CM

## 2017-09-03 DIAGNOSIS — F17200 Nicotine dependence, unspecified, uncomplicated: Secondary | ICD-10-CM

## 2017-09-03 NOTE — Progress Notes (Signed)
Patient Chris Lopez [name], male DOB:23-Jan-1946, 72 y.o. ALP:[id number]  Chief Complaint  Patient presents with  . Knee Pain    bilateral    HPI  Chris Lopez is a 72 y.o. male who has bilateral knee pain.  He has had bilateral total knees.  He has more pain and instability on the left knee.  He is followed at [facility] for this.  He has no new trauma.  He has no redness.  He has had some more pain recently.  The right knee is tender but not painful.  I have had him sign a narcotic treatment agreement today. HPI  Body mass index is 29.16 kg/m.  ROS  Review of Systems  HENT: Negative for congestion.   Respiratory: Positive for shortness of breath. Negative for cough.   Cardiovascular: Negative for chest pain and leg swelling.  Endocrine: Positive for cold intolerance.  Musculoskeletal: Positive for arthralgias, back pain, gait problem and myalgias.  Allergic/Immunologic: Positive for environmental allergies.  All other systems reviewed and are negative.   Past Medical History:  Diagnosis Date  . Anxiety   . BPH (benign prostatic hyperplasia)   . Chronic bronchitis   . COPD (chronic obstructive pulmonary disease) ([place])   . Depression   . Diabetes mellitus without complication ([place])    Resolved; not needing medications at this time.  . Hypertension     Past Surgical History:  Procedure Laterality Date  . carpal tunnel right hand    . CERVICAL FUSION    . left knee replacement    . left knee revised X 3    . multiple knee arthroscopies    . right knee replacement    . tarsal tunnel repair    . THYROIDECTOMY, PARTIAL    . TONSILLECTOMY      Family History  Problem Relation Age of Onset  . Colon cancer Maternal Grandmother   . Colon cancer Paternal Grandmother     Social History Social History   Tobacco Use  . Smoking status: Current Every Day Smoker    Packs/day: 1.00    Years: 40.00    Pack years: 40.00    Types: Cigarettes  . Smokeless  tobacco: Never Used  Substance Use Topics  . Alcohol use: No  . Drug use: No    Allergies  Allergen Reactions  . Bee Venom Anaphylaxis    Allergic to bee stings not the kit  . Feldene [Piroxicam] Rash and Dermatitis    Current Outpatient Medications  Medication Sig Dispense Refill  . aspirin 325 MG tablet Take 325 mg by mouth daily.     Marland Kitchen atorvastatin (LIPITOR) 20 MG tablet Take 20 mg by mouth daily. Reported on 06/07/2015    . diclofenac (VOLTAREN) 75 MG EC tablet TAKE 1 TABLET TWICE DAILY 180 tablet 5  . gabapentin (NEURONTIN) 300 MG capsule Take 300 mg by mouth 3 (three) times daily. Reported on 06/07/2015    . LORazepam (ATIVAN) 1 MG tablet Take 2 mg by mouth at bedtime. Reported on 06/07/2015    . losartan (COZAAR) 100 MG tablet Take 100 mg by mouth daily.    . metFORMIN (GLUCOPHAGE) 500 MG tablet Take 500 mg by mouth 2 (two) times daily. Reported on 06/07/2015    . metoprolol (LOPRESSOR) 50 MG tablet Take 50 mg by mouth daily.     . nitroGLYCERIN (NITROSTAT) 0.4 MG SL tablet Place 0.4 mg under the tongue every 5 (five) minutes x 3 doses as needed.  For chest pain    . omeprazole (PRILOSEC OTC) 20 MG tablet Take 20 mg by mouth daily. Reported on 06/07/2015    . oxyCODONE-acetaminophen (PERCOCET) 7.5-325 MG tablet Take 1 tablet by mouth every 4 (four) hours as needed. 90 tablet 0  . polyethylene glycol-electrolytes (TRILYTE) 420 g solution Take 4,000 mLs by mouth as directed. 4000 mL 0  . Tamsulosin HCl (FLOMAX) 0.4 MG CAPS Take 0.4 mg by mouth daily. Reported on 06/07/2015    . venlafaxine (EFFEXOR-XR) 150 MG 24 hr capsule Take 150 mg by mouth daily.       No current facility-administered medications for this visit.      Physical Exam  Blood pressure 119/69, pulse 61, temperature 97.8 F (36.6 C), height 6' (1.829 m), weight 215 lb (97.5 kg).  Constitutional: overall normal hygiene, normal nutrition, well developed, normal grooming, normal body habitus. Assistive  device:cane  Musculoskeletal: gait and station Limp left, muscle tone and strength are normal, no tremors or atrophy is present.  .  Neurological: coordination overall normal.  Deep tendon reflex/nerve stretch intact.  Sensation normal.  Cranial nerves II-XII intact.   Skin:   Normal overall no scars, lesions, ulcers or rashes. No psoriasis.  Psychiatric: Alert and oriented x 3.  Recent memory intact, remote memory unclear.  Normal mood and affect. Well groomed.  Good eye contact.  Cardiovascular: overall no swelling, no varicosities, no edema bilaterally, normal temperatures of the legs and arms, no clubbing, cyanosis and good capillary refill.  Lymphatic: palpation is normal.  Both knees with midline scars.  Left knee tender and painful ROM of 0 to 95, right knee 0 to 110.  He has brace on the left knee.  NV intact.  All other systems reviewed and are negative   The patient has been educated about the nature of the problem(s) and counseled on treatment options.  The patient appeared to understand what I have discussed and is in agreement with it.  Encounter Diagnoses  Name Primary?  . Chronic pain of both knees Yes  . Tobacco smoker within last 12 months     PLAN Call if any problems.  Precautions discussed.  Continue current medications.   Return to clinic 3 months   Electronically Signed Sanjuana Kava, MD 5/28/201910:20 AM

## 2017-09-03 NOTE — Patient Instructions (Signed)

## 2017-09-10 ENCOUNTER — Other Ambulatory Visit: Payer: Self-pay | Admitting: Orthopaedic Surgery

## 2017-09-10 MED ORDER — OXYCODONE-ACETAMINOPHEN 7.5-325 MG PO TABS: 1.0000 | ORAL_TABLET | ORAL | 0 refills | Status: DC | PRN

## 2017-09-26 NOTE — Patient Instructions (Signed)
Chris Lopez  09/26/2017     @PREFPERIOPPHARMACY @   Your procedure is scheduled on  10/07/2017   Report to [facility] at  800  A.M.  Call this number if you have problems the morning of surgery:  [phone]   Remember:  Do not eat or drink after midnight.  You may drink clear liquids until (follow the instructions given to you) .  Clear liquids allowed are:                    Water, Juice (non-citric and without pulp), Carbonated beverages, Clear Tea, Black Coffee only, Plain Jell-O only, Gatorade and Plain Popsicles only    Take these medicines the morning of surgery with A SIP OF WATER  Amlodipine, voltaren, gabapentin, prilosec, percocet, flomax. Use your inhaler before you come.    Do not wear jewelry, make-up or nail polish.  Do not wear lotions, powders, or perfumes, or deodorant.  Do not shave 48 hours prior to surgery.  Men may shave face and neck.  Do not bring valuables to the hospital.  [facility] is not responsible for any belongings or valuables.  Contacts, dentures or bridgework may not be worn into surgery.  Leave your suitcase in the car.  After surgery it may be brought to your room.  For patients admitted to the hospital, discharge time will be determined by your treatment team.  Patients discharged the day of surgery will not be allowed to drive home.   Name and phone number of your driver:   family Special instructions:  Follow the dit and prep instructions given to you by Dr Roseanne Kaufman office.  Please read over the following fact sheets that you were given. Anesthesia Post-op Instructions and Care and Recovery After Surgery       Esophagogastroduodenoscopy Esophagogastroduodenoscopy (EGD) is a procedure to examine the lining of the esophagus, stomach, and first part of the small intestine (duodenum). This procedure is done to check for problems such as inflammation, bleeding, ulcers, or growths. During this procedure, a long,  flexible, lighted tube with a camera attached (endoscope) is inserted down the throat. Tell a health care provider about:  Any allergies you have.  All medicines you are taking, including vitamins, herbs, eye drops, creams, and over-the-counter medicines.  Any problems you or family members have had with anesthetic medicines.  Any blood disorders you have.  Any surgeries you have had.  Any medical conditions you have.  Whether you are pregnant or may be pregnant. What are the risks? Generally, this is a safe procedure. However, problems may occur, including:  Infection.  Bleeding.  A tear (perforation) in the esophagus, stomach, or duodenum.  Trouble breathing.  Excessive sweating.  Spasms of the larynx.  A slowed heartbeat.  Low blood pressure.  What happens before the procedure?  Follow instructions from your health care provider about eating or drinking restrictions.  Ask your health care provider about: ? Changing or stopping your regular medicines. This is especially important if you are taking diabetes medicines or blood thinners. ? Taking medicines such as aspirin and ibuprofen. These medicines can thin your blood. Do not take these medicines before your procedure if your health care provider instructs you not to.  Plan to have someone take you home after the procedure.  If you wear dentures, be ready to remove them before the procedure. What happens during the procedure?  To reduce your risk of infection, your health care team will wash or sanitize their hands.  An IV tube will be put in a vein in your hand or arm. You will get medicines and fluids through this tube.  You will be given one or more of the following: ? A medicine to help you relax (sedative). ? A medicine to numb the area (local anesthetic). This medicine may be sprayed into your throat. It will make you feel more comfortable and keep you from gagging or coughing during the procedure. ? A  medicine for pain.  A mouth guard may be placed in your mouth to protect your teeth and to keep you from biting on the endoscope.  You will be asked to lie on your left side.  The endoscope will be lowered down your throat into your esophagus, stomach, and duodenum.  Air will be put into the endoscope. This will help your health care provider see better.  The lining of your esophagus, stomach, and duodenum will be examined.  Your health care provider may: ? Take a tissue sample so it can be looked at in a lab (biopsy). ? Remove growths. ? Remove objects (foreign bodies) that are stuck. ? Treat any bleeding with medicines or other devices that stop tissue from bleeding. ? Widen (dilate) or stretch narrowed areas of your esophagus and stomach.  The endoscope will be taken out. The procedure may vary among health care providers and hospitals. What happens after the procedure?  Your blood pressure, heart rate, breathing rate, and blood oxygen level will be monitored often until the medicines you were given have worn off.  Do not eat or drink anything until the numbing medicine has worn off and your gag reflex has returned. This information is not intended to replace advice given to you by your health care provider. Make sure you discuss any questions you have with your health care provider. Document Released: 07/27/2004 Document Revised: 09/01/2015 Document Reviewed: 02/17/2015 Elsevier Interactive Patient Education  2018 [employer]. Esophagogastroduodenoscopy, Care After Refer to this sheet in the next few weeks. These instructions provide you with information about caring for yourself after your procedure. Your health care provider may also give you more specific instructions. Your treatment has been planned according to current medical practices, but problems sometimes occur. Call your health care provider if you have any problems or questions after your procedure. What can I expect  after the procedure? After the procedure, it is common to have:  A sore throat.  Nausea.  Bloating.  Dizziness.  Fatigue.  Follow these instructions at home:  Do not eat or drink anything until the numbing medicine (local anesthetic) has worn off and your gag reflex has returned. You will know that the local anesthetic has worn off when you can swallow comfortably.  Do not drive for 24 hours if you received a medicine to help you relax (sedative).  If your health care provider took a tissue sample for testing during the procedure, make sure to get your test results. This is your responsibility. Ask your health care provider or the department performing the test when your results will be ready.  Keep all follow-up visits as told by your health care provider. This is important. Contact a health care provider if:  You cannot stop coughing.  You are not urinating.  You are urinating less than usual. Get help right away if:  You have trouble swallowing.  You cannot eat or drink.  You  have throat or chest pain that gets worse.  You are dizzy or light-headed.  You faint.  You have nausea or vomiting.  You have chills.  You have a fever.  You have severe abdominal pain.  You have black, tarry, or bloody stools. This information is not intended to replace advice given to you by your health care provider. Make sure you discuss any questions you have with your health care provider. Document Released: 03/12/2012 Document Revised: 09/01/2015 Document Reviewed: 02/17/2015 Elsevier Interactive Patient Education  2018 [employer].  Colonoscopy, Adult A colonoscopy is an exam to look at the large intestine. It is done to check for problems, such as:  Lumps (tumors).  Growths (polyps).  Swelling (inflammation).  Bleeding.  What happens before the procedure? Eating and drinking Follow instructions from your doctor about eating and drinking. These instructions may  include:  A few days before the procedure - follow a low-fiber diet. ? Avoid nuts. ? Avoid seeds. ? Avoid dried fruit. ? Avoid raw fruits. ? Avoid vegetables.  1-3 days before the procedure - follow a clear liquid diet. Avoid liquids that have red or purple dye. Drink only clear liquids, such as: ? Clear broth or bouillon. ? Black coffee or tea. ? Clear juice. ? Clear soft drinks or sports drinks. ? Gelatin dessert. ? Popsicles.  On the day of the procedure - do not eat or drink anything during the 2 hours before the procedure.  Bowel prep If you were prescribed an oral bowel prep:  Take it as told by your doctor. Starting the day before your procedure, you will need to drink a lot of liquid. The liquid will cause you to poop (have bowel movements) until your poop is almost clear or light green.  If your skin or butt gets irritated from diarrhea, you may: ? Wipe the area with wipes that have medicine in them, such as adult wet wipes with aloe and vitamin E. ? Put something on your skin that soothes the area, such as petroleum jelly.  If you throw up (vomit) while drinking the bowel prep, take a break for up to 60 minutes. Then begin the bowel prep again. If you keep throwing up and you cannot take the bowel prep without throwing up, call your doctor.  General instructions  Ask your doctor about changing or stopping your normal medicines. This is important if you take diabetes medicines or blood thinners.  Plan to have someone take you home from the hospital or clinic. What happens during the procedure?  An IV tube may be put into one of your veins.  You will be given medicine to help you relax (sedative).  To reduce your risk of infection: ? Your doctors will wash their hands. ? Your anal area will be washed with soap.  You will be asked to lie on your side with your knees bent.  Your doctor will get a long, thin, flexible tube ready. The tube will have a camera and a  light on the end.  The tube will be put into your anus.  The tube will be gently put into your large intestine.  Air will be delivered into your large intestine to keep it open. You may feel some pressure or cramping.  The camera will be used to take photos.  A small tissue sample may be removed from your body to be looked at under a microscope (biopsy). If any possible problems are found, the tissue will be sent to  a lab for testing.  If small growths are found, your doctor may remove them and have them checked for cancer.  The tube that was put into your anus will be slowly removed. The procedure may vary among doctors and hospitals. What happens after the procedure?  Your doctor will check on you often until the medicines you were given have worn off.  Do not drive for 24 hours after the procedure.  You may have a small amount of blood in your poop.  You may pass gas.  You may have mild cramps or bloating in your belly (abdomen).  It is up to you to get the results of your procedure. Ask your doctor, or the department performing the procedure, when your results will be ready. This information is not intended to replace advice given to you by your health care provider. Make sure you discuss any questions you have with your health care provider. Document Released: 04/28/2010 Document Revised: 01/25/2016 Document Reviewed: 06/07/2015 Elsevier Interactive Patient Education  2017 [employer]  Colonoscopy, Adult, Care After This sheet gives you information about how to care for yourself after your procedure. Your health care provider may also give you more specific instructions. If you have problems or questions, contact your health care provider. What can I expect after the procedure? After the procedure, it is common to have:  A small amount of blood in your stool for 24 hours after the procedure.  Some gas.  Mild abdominal cramping or bloating.  Follow these  instructions at home: General instructions   For the first 24 hours after the procedure: ? Do not drive or use machinery. ? Do not sign important documents. ? Do not drink alcohol. ? Do your regular daily activities at a slower pace than normal. ? Eat soft, easy-to-digest foods. ? Rest often.  Take over-the-counter or prescription medicines only as told by your health care provider.  It is up to you to get the results of your procedure. Ask your health care provider, or the department performing the procedure, when your results will be ready. Relieving cramping and bloating  Try walking around when you have cramps or feel bloated.  Apply heat to your abdomen as told by your health care provider. Use a heat source that your health care provider recommends, such as a moist heat pack or a heating pad. ? Place a towel between your skin and the heat source. ? Leave the heat on for 20-30 minutes. ? Remove the heat if your skin turns bright red. This is especially important if you are unable to feel pain, heat, or cold. You may have a greater risk of getting burned. Eating and drinking  Drink enough fluid to keep your urine clear or pale yellow.  Resume your normal diet as instructed by your health care provider. Avoid heavy or fried foods that are hard to digest.  Avoid drinking alcohol for as long as instructed by your health care provider. Contact a health care provider if:  You have blood in your stool 2-3 days after the procedure. Get help right away if:  You have more than a small spotting of blood in your stool.  You pass large blood clots in your stool.  Your abdomen is swollen.  You have nausea or vomiting.  You have a fever.  You have increasing abdominal pain that is not relieved with medicine. This information is not intended to replace advice given to you by your health care provider. Make sure  you discuss any questions you have with your health care  provider. Document Released: 11/08/2003 Document Revised: 12/19/2015 Document Reviewed: 06/07/2015 Elsevier Interactive Patient Education  2018 [employer] Anesthesia is a term that refers to techniques, procedures, and medicines that help a person stay safe and comfortable during a medical procedure. Monitored anesthesia care, or sedation, is one type of anesthesia. Your anesthesia specialist may recommend sedation if you will be having a procedure that does not require you to be unconscious, such as:  Cataract surgery.  A dental procedure.  A biopsy.  A colonoscopy.  During the procedure, you may receive a medicine to help you relax (sedative). There are three levels of sedation:  Mild sedation. At this level, you may feel awake and relaxed. You will be able to follow directions.  Moderate sedation. At this level, you will be sleepy. You may not remember the procedure.  Deep sedation. At this level, you will be asleep. You will not remember the procedure.  The more medicine you are given, the deeper your level of sedation will be. Depending on how you respond to the procedure, the anesthesia specialist may change your level of sedation or the type of anesthesia to fit your needs. An anesthesia specialist will monitor you closely during the procedure. Let your health care provider know about:  Any allergies you have.  All medicines you are taking, including vitamins, herbs, eye drops, creams, and over-the-counter medicines.  Any use of steroids (by mouth or as a cream).  Any problems you or family members have had with sedatives and anesthetic medicines.  Any blood disorders you have.  Any surgeries you have had.  Any medical conditions you have, such as sleep apnea.  Whether you are pregnant or may be pregnant.  Any use of cigarettes, alcohol, or street drugs. What are the risks? Generally, this is a safe procedure. However, problems may  occur, including:  Getting too much medicine (oversedation).  Nausea.  Allergic reaction to medicines.  Trouble breathing. If this happens, a breathing tube may be used to help with breathing. It will be removed when you are awake and breathing on your own.  Heart trouble.  Lung trouble.  Before the procedure Staying hydrated Follow instructions from your health care provider about hydration, which may include:  Up to 2 hours before the procedure - you may continue to drink clear liquids, such as water, clear fruit juice, black coffee, and plain tea.  Eating and drinking restrictions Follow instructions from your health care provider about eating and drinking, which may include:  8 hours before the procedure - stop eating heavy meals or foods such as meat, fried foods, or fatty foods.  6 hours before the procedure - stop eating light meals or foods, such as toast or cereal.  6 hours before the procedure - stop drinking milk or drinks that contain milk.  2 hours before the procedure - stop drinking clear liquids.  Medicines Ask your health care provider about:  Changing or stopping your regular medicines. This is especially important if you are taking diabetes medicines or blood thinners.  Taking medicines such as aspirin and ibuprofen. These medicines can thin your blood. Do not take these medicines before your procedure if your health care provider instructs you not to.  Tests and exams  You will have a physical exam.  You may have blood tests done to show: ? How well your kidneys and liver are working. ? How well  your blood can clot.  General instructions  Plan to have someone take you home from the hospital or clinic.  If you will be going home right after the procedure, plan to have someone with you for 24 hours.  What happens during the procedure?  Your blood pressure, heart rate, breathing, level of pain and overall condition will be monitored.  An IV  tube will be inserted into one of your veins.  Your anesthesia specialist will give you medicines as needed to keep you comfortable during the procedure. This may mean changing the level of sedation.  The procedure will be performed. After the procedure  Your blood pressure, heart rate, breathing rate, and blood oxygen level will be monitored until the medicines you were given have worn off.  Do not drive for 24 hours if you received a sedative.  You may: ? Feel sleepy, clumsy, or nauseous. ? Feel forgetful about what happened after the procedure. ? Have a sore throat if you had a breathing tube during the procedure. ? Vomit. This information is not intended to replace advice given to you by your health care provider. Make sure you discuss any questions you have with your health care provider. Document Released: 12/20/2004 Document Revised: 09/02/2015 Document Reviewed: 07/17/2015 Elsevier Interactive Patient Education  2018 [employer], Care After These instructions provide you with information about caring for yourself after your procedure. Your health care provider may also give you more specific instructions. Your treatment has been planned according to current medical practices, but problems sometimes occur. Call your health care provider if you have any problems or questions after your procedure. What can I expect after the procedure? After your procedure, it is common to:  Feel sleepy for several hours.  Feel clumsy and have poor balance for several hours.  Feel forgetful about what happened after the procedure.  Have poor judgment for several hours.  Feel nauseous or vomit.  Have a sore throat if you had a breathing tube during the procedure.  Follow these instructions at home: For at least 24 hours after the procedure:   Do not: ? Participate in activities in which you could fall or become injured. ? Drive. ? Use heavy  machinery. ? Drink alcohol. ? Take sleeping pills or medicines that cause drowsiness. ? Make important decisions or sign legal documents. ? Take care of children on your own.  Rest. Eating and drinking  Follow the diet that is recommended by your health care provider.  If you vomit, drink water, juice, or soup when you can drink without vomiting.  Make sure you have little or no nausea before eating solid foods. General instructions  Have a responsible adult stay with you until you are awake and alert.  Take over-the-counter and prescription medicines only as told by your health care provider.  If you smoke, do not smoke without supervision.  Keep all follow-up visits as told by your health care provider. This is important. Contact a health care provider if:  You keep feeling nauseous or you keep vomiting.  You feel light-headed.  You develop a rash.  You have a fever. Get help right away if:  You have trouble breathing. This information is not intended to replace advice given to you by your health care provider. Make sure you discuss any questions you have with your health care provider. Document Released: 07/17/2015 Document Revised: 11/16/2015 Document Reviewed: 07/17/2015 Elsevier Interactive Patient Education  [employer].

## 2017-09-30 ENCOUNTER — Encounter (HOSPITAL_COMMUNITY)
Admission: RE | Admit: 2017-09-30 | Discharge: 2017-09-30 | Disposition: A | Discharge status: Home / Self Care | Payer: Medicare HMO | Source: Ambulatory Visit | Attending: Internal Medicine | Admitting: Internal Medicine

## 2017-10-01 ENCOUNTER — Other Ambulatory Visit: Payer: Self-pay | Admitting: Orthopaedic Surgery

## 2017-10-01 MED ORDER — OXYCODONE-ACETAMINOPHEN 7.5-325 MG PO TABS: 1.0000 | ORAL_TABLET | ORAL | 0 refills | Status: DC | PRN

## 2017-10-02 ENCOUNTER — Other Ambulatory Visit: Payer: Self-pay

## 2017-10-02 ENCOUNTER — Encounter (HOSPITAL_COMMUNITY): Payer: Self-pay

## 2017-10-02 ENCOUNTER — Encounter (HOSPITAL_COMMUNITY)
Admission: RE | Admit: 2017-10-02 | Discharge: 2017-10-02 | Disposition: A | Discharge status: Home / Self Care | Payer: Medicare HMO | Source: Ambulatory Visit | Attending: Internal Medicine | Admitting: Internal Medicine

## 2017-10-02 DIAGNOSIS — Z01812 Encounter for preprocedural laboratory examination: Secondary | ICD-10-CM | POA: Insufficient documentation

## 2017-10-02 DIAGNOSIS — R9431 Abnormal electrocardiogram [ECG] [EKG]: Secondary | ICD-10-CM | POA: Diagnosis not present

## 2017-10-02 DIAGNOSIS — Z01810 Encounter for preprocedural cardiovascular examination: Secondary | ICD-10-CM | POA: Diagnosis not present

## 2017-10-02 HISTORY — DX: Unspecified osteoarthritis, unspecified site: M19.90

## 2017-10-02 HISTORY — DX: Gastro-esophageal reflux disease without esophagitis: K21.9

## 2017-10-02 LAB — BASIC METABOLIC PANEL
Anion gap: 7 (ref 5–15)
BUN: 12 mg/dL (ref 8–23)
CHLORIDE: 103 mmol/L (ref 98–111)
CO2: 28 mmol/L (ref 22–32)
Calcium: 8.2 mg/dL — ABNORMAL LOW (ref 8.9–10.3)
Creatinine, Ser: 1.11 mg/dL (ref 0.61–1.24)
GFR calc Af Amer: >60 mL/min (ref >60)
GFR calc non Af Amer: >60 mL/min (ref >60)
GLUCOSE: 100 mg/dL — AB (ref 70–99)
Potassium: 4.6 mmol/L (ref 3.5–5.1)
Sodium: 138 mmol/L (ref 135–145)

## 2017-10-02 LAB — CBC WITH DIFFERENTIAL/PLATELET
Basophils Absolute: 0.1 10*3/uL (ref 0.0–0.1)
Basophils Relative: 1 %
Eosinophils Absolute: 0.3 10*3/uL (ref 0.0–0.7)
Eosinophils Relative: 3 %
HCT: 38.9 % — ABNORMAL LOW (ref 39.0–52.0)
HEMOGLOBIN: 12.7 g/dL — AB (ref 13.0–17.0)
LYMPHS ABS: 2.5 10*3/uL (ref 0.7–4.0)
LYMPHS PCT: 30 %
MCH: 28.9 pg (ref 26.0–34.0)
MCHC: 32.6 g/dL (ref 30.0–36.0)
MCV: 88.4 fL (ref 78.0–100.0)
Monocytes Absolute: 0.8 10*3/uL (ref 0.1–1.0)
Monocytes Relative: 9 %
NEUTROS ABS: 4.8 10*3/uL (ref 1.7–7.7)
NEUTROS PCT: 57 %
Platelets: 158 10*3/uL (ref 150–400)
RBC: 4.4 MIL/uL (ref 4.22–5.81)
RDW: 14.8 % (ref 11.5–15.5)
WBC: 8.4 10*3/uL (ref 4.0–10.5)

## 2017-10-07 ENCOUNTER — Ambulatory Visit (HOSPITAL_COMMUNITY): Payer: Medicare HMO | Admitting: Anesthesiology

## 2017-10-07 ENCOUNTER — Encounter (HOSPITAL_COMMUNITY): Payer: Self-pay | Admitting: *Deleted

## 2017-10-07 ENCOUNTER — Encounter (HOSPITAL_COMMUNITY)
Admission: RE | Disposition: A | Discharge status: Home / Self Care | Payer: Self-pay | Source: Ambulatory Visit | Attending: Internal Medicine

## 2017-10-07 ENCOUNTER — Other Ambulatory Visit: Payer: Self-pay

## 2017-10-07 ENCOUNTER — Ambulatory Visit (HOSPITAL_COMMUNITY)
Admission: RE | Admit: 2017-10-07 | Discharge: 2017-10-07 | Disposition: A | Discharge status: Home / Self Care | Payer: Medicare HMO | Source: Ambulatory Visit | Attending: Internal Medicine | Admitting: Internal Medicine

## 2017-10-07 DIAGNOSIS — E118 Type 2 diabetes mellitus with unspecified complications: Secondary | ICD-10-CM | POA: Insufficient documentation

## 2017-10-07 DIAGNOSIS — K641 Second degree hemorrhoids: Secondary | ICD-10-CM | POA: Diagnosis not present

## 2017-10-07 DIAGNOSIS — N400 Enlarged prostate without lower urinary tract symptoms: Secondary | ICD-10-CM | POA: Diagnosis not present

## 2017-10-07 DIAGNOSIS — E890 Postprocedural hypothyroidism: Secondary | ICD-10-CM | POA: Diagnosis not present

## 2017-10-07 DIAGNOSIS — J449 Chronic obstructive pulmonary disease, unspecified: Secondary | ICD-10-CM | POA: Insufficient documentation

## 2017-10-07 DIAGNOSIS — Z1211 Encounter for screening for malignant neoplasm of colon: Secondary | ICD-10-CM | POA: Diagnosis not present

## 2017-10-07 DIAGNOSIS — K6389 Other specified diseases of intestine: Secondary | ICD-10-CM | POA: Diagnosis not present

## 2017-10-07 DIAGNOSIS — M1990 Unspecified osteoarthritis, unspecified site: Secondary | ICD-10-CM | POA: Insufficient documentation

## 2017-10-07 DIAGNOSIS — F17210 Nicotine dependence, cigarettes, uncomplicated: Secondary | ICD-10-CM | POA: Insufficient documentation

## 2017-10-07 DIAGNOSIS — Z86010 Personal history of colonic polyps: Secondary | ICD-10-CM | POA: Diagnosis not present

## 2017-10-07 DIAGNOSIS — Z91030 Bee allergy status: Secondary | ICD-10-CM | POA: Diagnosis not present

## 2017-10-07 DIAGNOSIS — F419 Anxiety disorder, unspecified: Secondary | ICD-10-CM | POA: Insufficient documentation

## 2017-10-07 DIAGNOSIS — K5900 Constipation, unspecified: Secondary | ICD-10-CM

## 2017-10-07 DIAGNOSIS — I10 Essential (primary) hypertension: Secondary | ICD-10-CM | POA: Diagnosis not present

## 2017-10-07 DIAGNOSIS — F329 Major depressive disorder, single episode, unspecified: Secondary | ICD-10-CM | POA: Insufficient documentation

## 2017-10-07 DIAGNOSIS — K219 Gastro-esophageal reflux disease without esophagitis: Secondary | ICD-10-CM | POA: Insufficient documentation

## 2017-10-07 DIAGNOSIS — K633 Ulcer of intestine: Secondary | ICD-10-CM | POA: Diagnosis not present

## 2017-10-07 DIAGNOSIS — Z96653 Presence of artificial knee joint, bilateral: Secondary | ICD-10-CM | POA: Diagnosis not present

## 2017-10-07 DIAGNOSIS — Z79899 Other long term (current) drug therapy: Secondary | ICD-10-CM | POA: Diagnosis not present

## 2017-10-07 DIAGNOSIS — Z888 Allergy status to other drugs, medicaments and biological substances status: Secondary | ICD-10-CM | POA: Diagnosis not present

## 2017-10-07 DIAGNOSIS — C8310 Mantle cell lymphoma, unspecified site: Secondary | ICD-10-CM

## 2017-10-07 DIAGNOSIS — K5730 Diverticulosis of large intestine without perforation or abscess without bleeding: Secondary | ICD-10-CM | POA: Insufficient documentation

## 2017-10-07 DIAGNOSIS — Z7951 Long term (current) use of inhaled steroids: Secondary | ICD-10-CM | POA: Diagnosis not present

## 2017-10-07 DIAGNOSIS — Z981 Arthrodesis status: Secondary | ICD-10-CM | POA: Diagnosis not present

## 2017-10-07 DIAGNOSIS — R1011 Right upper quadrant pain: Secondary | ICD-10-CM | POA: Diagnosis not present

## 2017-10-07 DIAGNOSIS — D123 Benign neoplasm of transverse colon: Secondary | ICD-10-CM | POA: Diagnosis not present

## 2017-10-07 DIAGNOSIS — K635 Polyp of colon: Secondary | ICD-10-CM | POA: Insufficient documentation

## 2017-10-07 DIAGNOSIS — Z7982 Long term (current) use of aspirin: Secondary | ICD-10-CM | POA: Diagnosis not present

## 2017-10-07 DIAGNOSIS — R1031 Right lower quadrant pain: Secondary | ICD-10-CM

## 2017-10-07 DIAGNOSIS — Z800 Family history of malignant neoplasm of digestive organs: Secondary | ICD-10-CM | POA: Insufficient documentation

## 2017-10-07 DIAGNOSIS — K259 Gastric ulcer, unspecified as acute or chronic, without hemorrhage or perforation: Secondary | ICD-10-CM | POA: Insufficient documentation

## 2017-10-07 DIAGNOSIS — K3189 Other diseases of stomach and duodenum: Secondary | ICD-10-CM | POA: Diagnosis not present

## 2017-10-07 HISTORY — PX: COLONOSCOPY WITH PROPOFOL: SHX5780

## 2017-10-07 HISTORY — PX: BIOPSY: SHX5522

## 2017-10-07 HISTORY — PX: ESOPHAGOGASTRODUODENOSCOPY (EGD) WITH PROPOFOL: SHX5813

## 2017-10-07 HISTORY — PX: POLYPECTOMY: SHX5525

## 2017-10-07 LAB — GLUCOSE, CAPILLARY
GLUCOSE-CAPILLARY: 89 mg/dL (ref 70–99)
Glucose-Capillary: 95 mg/dL (ref 70–99)

## 2017-10-07 SURGERY — COLONOSCOPY WITH PROPOFOL
Anesthesia: Monitor Anesthesia Care

## 2017-10-07 MED ORDER — MIDAZOLAM HCL 5 MG/5ML IJ SOLN
INTRAMUSCULAR | Status: DC | PRN
  Administered 2017-10-07: 2 mg via INTRAVENOUS

## 2017-10-07 MED ORDER — PROPOFOL 10 MG/ML IV BOLUS
INTRAVENOUS | Status: AC
  Filled 2017-10-07: qty 20

## 2017-10-07 MED ORDER — MIDAZOLAM HCL 2 MG/2ML IJ SOLN
INTRAMUSCULAR | Status: AC
  Filled 2017-10-07: qty 2

## 2017-10-07 MED ORDER — EPHEDRINE SULFATE 50 MG/ML IJ SOLN
INTRAMUSCULAR | Status: DC | PRN
  Administered 2017-10-07 (×2): 15 mg via INTRAVENOUS

## 2017-10-07 MED ORDER — PROMETHAZINE HCL 25 MG/ML IJ SOLN: 6.2500 mg | INTRAMUSCULAR | Status: DC | PRN

## 2017-10-07 MED ORDER — LACTATED RINGERS IV SOLN
INTRAVENOUS | Status: DC
  Administered 2017-10-07: 11:00:00 via INTRAVENOUS

## 2017-10-07 MED ORDER — LIDOCAINE VISCOUS HCL 2 % MT SOLN
6.0000 mL | Freq: Once | OROMUCOSAL | Status: AC
  Administered 2017-10-07: 6 mL via OROMUCOSAL

## 2017-10-07 MED ORDER — LACTATED RINGERS IV SOLN
INTRAVENOUS | Status: DC
  Administered 2017-10-07: 500 mL via INTRAVENOUS

## 2017-10-07 MED ORDER — LIDOCAINE VISCOUS HCL 2 % MT SOLN
OROMUCOSAL | Status: AC
  Filled 2017-10-07: qty 15

## 2017-10-07 MED ORDER — HYDROCODONE-ACETAMINOPHEN 7.5-325 MG PO TABS: 1.0000 | ORAL_TABLET | Freq: Once | ORAL | Status: DC | PRN

## 2017-10-07 MED ORDER — HYDROMORPHONE HCL 1 MG/ML IJ SOLN: 0.2500 mg | INTRAMUSCULAR | Status: DC | PRN

## 2017-10-07 MED ORDER — LIDOCAINE HCL (PF) 1 % IJ SOLN
INTRAMUSCULAR | Status: AC
  Filled 2017-10-07: qty 5

## 2017-10-07 MED ORDER — PROPOFOL 10 MG/ML IV BOLUS
INTRAVENOUS | Status: DC | PRN
  Administered 2017-10-07 (×2): 20 mg via INTRAVENOUS

## 2017-10-07 MED ORDER — PROPOFOL 500 MG/50ML IV EMUL
INTRAVENOUS | Status: DC | PRN
  Administered 2017-10-07: 12:00:00 via INTRAVENOUS
  Administered 2017-10-07: 150 ug/kg/min via INTRAVENOUS

## 2017-10-07 MED ORDER — MEPERIDINE HCL 100 MG/ML IJ SOLN: 6.2500 mg | INTRAMUSCULAR | Status: DC | PRN

## 2017-10-07 MED ORDER — CHLORHEXIDINE GLUCONATE CLOTH 2 % EX PADS: 6.0000 | MEDICATED_PAD | Freq: Once | CUTANEOUS | Status: DC

## 2017-10-07 NOTE — Anesthesia Preprocedure Evaluation (Signed)
Anesthesia Evaluation  Patient identified by MRN, date of birth, ID band Patient awake    Reviewed: Allergy & Precautions, H&P , NPO status , Patient's Chart, lab work & pertinent test results, reviewed documented beta blocker date and time   Airway Mallampati: II  TM Distance: >3 FB Neck ROM: full    Dental no notable dental hx. (+) Upper Dentures, Lower Dentures   Pulmonary neg pulmonary ROS, COPD,  COPD inhaler, Current Smoker,    Pulmonary exam normal breath sounds clear to auscultation       Cardiovascular Exercise Tolerance: Good hypertension, On Medications + CAD  negative cardio ROS   Rhythm:regular Rate:Normal     Neuro/Psych negative neurological ROS  negative psych ROS   GI/Hepatic negative GI ROS, Neg liver ROS, GERD  ,  Endo/Other  negative endocrine ROSdiabetes, Type 2  Renal/GU negative Renal ROS  negative genitourinary   Musculoskeletal   Abdominal   Peds  Hematology negative hematology ROS (+)   Anesthesia Other Findings Tobacco abuse 1.5-2 ppd Denies CP/MI States DM resolved  FBS today 97  Reproductive/Obstetrics negative OB ROS                             Anesthesia Physical Anesthesia Plan  ASA: III  Anesthesia Plan: MAC   Post-op Pain Management:    Induction:   PONV Risk Score and Plan:   Airway Management Planned:   Additional Equipment:   Intra-op Plan:   Post-operative Plan:   Informed Consent: I have reviewed the patients History and Physical, chart, labs and discussed the procedure including the risks, benefits and alternatives for the proposed anesthesia with the patient or authorized representative who has indicated his/her understanding and acceptance.   Dental Advisory Given  Plan Discussed with: CRNA and Anesthesiologist  Anesthesia Plan Comments:         Anesthesia Quick Evaluation

## 2017-10-07 NOTE — Anesthesia Postprocedure Evaluation (Signed)
Anesthesia Post Note  Patient: Chris Lopez  Procedure(s) Performed: COLONOSCOPY WITH PROPOFOL (N/A ) ESOPHAGOGASTRODUODENOSCOPY (EGD) WITH PROPOFOL (N/A ) BIOPSY POLYPECTOMY  Patient location during evaluation: PACU Anesthesia Type: MAC Level of consciousness: awake and alert and patient cooperative Pain management: satisfactory to patient Vital Signs Assessment: post-procedure vital signs reviewed and stable Respiratory status: spontaneous breathing Cardiovascular status: stable Postop Assessment: no apparent nausea or vomiting Anesthetic complications: no     Last Vitals:  Vitals:   10/07/17 1315 10/07/17 1326  BP: 136/79 132/75  Pulse: (!) 57 65  Resp: 14   Temp:    SpO2: 95% 94%    Last Pain:  Vitals:   10/07/17 1326  TempSrc:   PainSc: 0-No pain                 Ani Deoliveira

## 2017-10-07 NOTE — Transfer of Care (Signed)
Immediate Anesthesia Transfer of Care Note  Patient: Chris Lopez  Procedure(s) Performed: COLONOSCOPY WITH PROPOFOL (N/A ) ESOPHAGOGASTRODUODENOSCOPY (EGD) WITH PROPOFOL (N/A ) BIOPSY POLYPECTOMY  Patient Location: PACU  Anesthesia Type:MAC  Level of Consciousness: awake, oriented, drowsy and patient cooperative  Airway & Oxygen Therapy: Patient Spontanous Breathing  Post-op Assessment: Report given to RN and Post -op Vital signs reviewed and stable  Post vital signs: Reviewed and stable  Last Vitals:  Vitals Value Taken Time  BP 103/70 10/07/2017 12:45 PM  Temp 36.7 C 10/07/2017 12:44 PM  Pulse 64 10/07/2017 12:46 PM  Resp 12 10/07/2017 12:46 PM  SpO2 96 % 10/07/2017 12:46 PM  Vitals shown include unvalidated device data.  Last Pain:  Vitals:   10/07/17 1151  TempSrc:   PainSc: 8       Patients Stated Pain Goal: 8 (17/49/44 9675)  Complications: No apparent anesthesia complications

## 2017-10-07 NOTE — Op Note (Signed)
[facility] Patient Name: Chris Lopez Procedure Date: 10/07/2017 12:09 PM MRN: [id number] Date of Birth: Oct 19, 1945 Attending MD: Norvel Richards , MD CSN: [id number] Age: 72 Admit Type: Outpatient Procedure:                Colonoscopy Indications:              High risk colon cancer surveillance: Personal                            history of colonic polyps Providers:                Norvel Richards, MD, Gwenlyn Fudge RN, RN,                            Randa Spike, Technician Referring MD:              Medicines:                Propofol per Anesthesia Complications:            No immediate complications. Estimated Blood Loss:     Estimated blood loss was minimal. Procedure:                Pre-Anesthesia Assessment:                           - Prior to the procedure, a History and Physical                            was performed, and patient medications and                            allergies were reviewed. The patient's tolerance of                            previous anesthesia was also reviewed. The risks                            and benefits of the procedure and the sedation                            options and risks were discussed with the patient.                            All questions were answered, and informed consent                            was obtained. Prior Anticoagulants: The patient has                            taken no previous anticoagulant or antiplatelet                            agents. ASA Grade Assessment: II - A patient with  mild systemic disease. After reviewing the risks                            and benefits, the patient was deemed in                            satisfactory condition to undergo the procedure.                           After obtaining informed consent, the colonoscope                            was passed under direct vision. Throughout the                            procedure, the  patient's blood pressure, pulse, and                            oxygen saturations were monitored continuously. The                            EC38-[id number] ([id number]) scope was introduced through                            the and advanced to the the cecum, identified by                            appendiceal orifice and ileocecal valve. The                            colonoscopy was performed without difficulty. The                            patient tolerated the procedure well. The quality                            of the bowel preparation was adequate. Scope In: 12:11:49 PM Scope Out: 12:35:05 PM Scope Withdrawal Time: 0 hours 7 minutes 33 seconds  Total Procedure Duration: 0 hours 23 minutes 16 seconds  Findings:      The perianal and digital rectal examinations were normal.      A 5 mm polyp was found in the transverse colon. The polyp was sessile.       The polyp was removed with a cold snare. Resection and retrieval were       complete. Estimated blood loss was minimal.      Nonbleeding ulcerated mucosa were present at the ileocecal valve. here       was significant "geographic" ulceration of the entire ileocecal       bowel.Edematous mucosa. TI would not admit scope. This was biopsied with       a cold forceps for histology. Estimated blood loss was minimal.      Non-bleeding internal hemorrhoids were found during retroflexion. The       hemorrhoids were moderate, medium-sized and Grade II (internal       hemorrhoids that prolapse but  reduce spontaneously).      The exam was otherwise without abnormality on direct and retroflexion       views.      Many medium-mouthed diverticula were found in the sigmoid colon and       descending colon. Impression:               NSAID effect (upper and lower GI tract) ease need                            to be considered. Less likely inflammatory bowel                            disease, Bechets.                           - One 5 mm polyp in  the transverse colon, removed                            with a cold snare. Resected and retrieved.                           - Mucosal ulceration. Biopsied.                           - Non-bleeding internal hemorrhoids.                           - The examination was otherwise normal on direct                            and retroflexion views.                           - Diverticulosis in the sigmoid colon and in the                            descending colon. Moderate Sedation:      Moderate (conscious) sedation was personally administered by an       anesthesia professional. The following parameters were monitored: oxygen       saturation, heart rate, blood pressure, respiratory rate, EKG, adequacy       of pulmonary ventilation, and response to care. Total physician       intraservice time was 42 minutes. Recommendation:           - Patient has a contact number available for                            emergencies. The signs and symptoms of potential                            delayed complications were discussed with the                            patient. Return to normal activities tomorrow.  Written discharge instructions were provided to the                            patient.                           - Resume previous diet.                           - Continue present medications.                           - Repeat colonoscopy date to be determined after                            pending pathology results are reviewed for                            surveillance.                           - Return to GI office after studies are complete.                            See EGD report. Procedure Code(s):        --- Professional ---                           [id number], Colonoscopy, flexible; with removal of                            tumor(s), polyp(s), or other lesion(s) by snare                            technique                           45380, 43,  Colonoscopy, flexible; with biopsy,                            single or multiple Diagnosis Code(s):        --- Professional ---                           Z86.010, Personal history of colonic polyps                           D12.3, Benign neoplasm of transverse colon (hepatic                            flexure or splenic flexure)                           K63.3, Ulcer of intestine                           K64.1, Second degree hemorrhoids  K57.30, Diverticulosis of large intestine without                            perforation or abscess without bleeding CPT copyright 2017 American [employer] Association. All rights reserved. The codes documented in this report are preliminary and upon coder review may  be revised to meet current compliance requirements. [name]. Yossef Gilkison, MD Norvel Richards, MD 10/07/2017 12:45:43 PM This report has been signed electronically. Number of Addenda: 0

## 2017-10-07 NOTE — Op Note (Signed)
[facility] Patient Name: Chris Lopez Procedure Date: 10/07/2017 11:31 AM MRN: [id number] Date of Birth: 1945-07-18 Attending MD: Norvel Richards , MD CSN: [id number] Age: 72 Admit Type: Outpatient Procedure:                Upper GI endoscopy Indications:              Abdominal pain in the right upper quadrant Providers:                Norvel Richards, MD, Otis Peak B. Sharon Seller, RN,                            Randa Spike, Technician Referring MD:              Medicines:                Propofol per Anesthesia Complications:            No immediate complications. Estimated Blood Loss:     Estimated blood loss was minimal. Procedure:                Pre-Anesthesia Assessment:                           - Prior to the procedure, a History and Physical                            was performed, and patient medications and                            allergies were reviewed. The patient's tolerance of                            previous anesthesia was also reviewed. The risks                            and benefits of the procedure and the sedation                            options and risks were discussed with the patient.                            All questions were answered, and informed consent                            was obtained. Prior Anticoagulants: The patient has                            taken no previous anticoagulant or antiplatelet                            agents. ASA Grade Assessment: II - A patient with                            mild systemic disease. After reviewing the risks  and benefits, the patient was deemed in                            satisfactory condition to undergo the procedure.                           After obtaining informed consent, the endoscope was                            passed under direct vision. Throughout the                            procedure, the patient's blood pressure, pulse, and               oxygen saturations were monitored continuously. The                            [id number] ([id number]) scope was introduced through the                            and advanced to the second part of duodenum. The                            upper GI endoscopy was accomplished without                            difficulty. The patient tolerated the procedure                            well. Scope In: 11:57:11 AM Scope Out: 13:08:65 PM Total Procedure Duration: 0 hours 9 minutes 32 seconds  Findings:      The examined esophagus was normal. This was biopsied with a cold forceps       for histology. Estimated blood loss was minimal.      Mucosal changes were found in the cardia. There was nodularity with       overlying ulceration at the GE junction on the gastric side. Seen well       retroflexed and on?"face. remainder of the gastric mucosa appeared normal.      The duodenal bulb and second portion of the duodenum were normal. Impression:               - Normal esophagus.                           - Mucosal changes in the cardia. nodularity with                            ulceration as described?"biopsied                           - Normal duodenal bulb and second portion of the                            duodenum. Moderate Sedation:      Moderate (conscious) sedation was personally administered by an  anesthesia professional. The following parameters were monitored: oxygen       saturation, heart rate, blood pressure, respiratory rate, EKG, adequacy       of pulmonary ventilation, and response to care. Total physician       intraservice time was 13 minutes.      Moderate (conscious) sedation was personally administered by an       anesthesia professional. The following parameters were monitored: oxygen       saturation, heart rate, blood pressure, respiratory rate, EKG, adequacy       of pulmonary ventilation, and response to care. Total physician       intraservice time was  13 minutes. Recommendation:           - Patient has a contact number available for                            emergencies. The signs and symptoms of potential                            delayed complications were discussed with the                            patient. Return to normal activities tomorrow.                            Written discharge instructions were provided to the                            patient.                           - Resume previous diet.                           - Continue present medications.                           - No repeat upper endoscopy.                           - Return to GI office in 3 months. See colonoscopy                            report. Procedure Code(s):        --- Professional ---                           [phone], Esophagogastroduodenoscopy, flexible,                            transoral; with biopsy, single or multiple Diagnosis Code(s):        --- Professional ---                           K31.89, Other diseases of stomach and duodenum                           R10.11,  Right upper quadrant pain CPT copyright 2017 American Medical Association. All rights reserved. The codes documented in this report are preliminary and upon coder review may  be revised to meet current compliance requirements. [name]. Tasman Zapata, MD Norvel Richards, MD 10/07/2017 12:40:02 PM This report has been signed electronically. Number of Addenda: 0

## 2017-10-07 NOTE — H&P (Signed)
@LOGO @   Primary Care Physician:  Celene Squibb, MD Primary Gastroenterologist:  Dr. Gala Romney  Pre-Procedure History & Physical: HPI:  Chris Lopez is a 72 y.o. male here for for further evaluation of right upper quadrant abdominal pain via EGD and surveillance colonoscopy given history of colonic polyps.  Past Medical History:  Diagnosis Date  . Anxiety   . Arthritis   . BPH (benign prostatic hyperplasia)   . Chronic bronchitis   . COPD (chronic obstructive pulmonary disease) ([place])   . Depression   . Diabetes mellitus without complication ([place])    Resolved; not needing medications at this time.  Marland Kitchen GERD (gastroesophageal reflux disease)   . Hypertension     Past Surgical History:  Procedure Laterality Date  . carpal tunnel right hand    . CERVICAL FUSION    . left knee replacement    . left knee revised X 3    . multiple knee arthroscopies    . right knee replacement    . tarsal tunnel repair    . THYROIDECTOMY, PARTIAL    . TONSILLECTOMY      Prior to Admission medications   Medication Sig Start Date End Date Taking? Authorizing Provider  albuterol (PROVENTIL HFA;VENTOLIN HFA) 108 (90 Base) MCG/ACT inhaler Inhale 2 puffs into the lungs every 6 (six) hours as needed for wheezing or shortness of breath.   Yes [provider]  amLODipine (NORVASC) 5 MG tablet Take 5 mg by mouth every evening. 07/01/17  Yes [provider]  aspirin 325 MG tablet Take 325 mg by mouth daily.    Yes [provider]  atorvastatin (LIPITOR) 20 MG tablet Take 20 mg by mouth every evening. Reported on 06/07/2015   Yes [provider]  diclofenac (VOLTAREN) 75 MG EC tablet TAKE 1 TABLET TWICE DAILY 02/18/17  Yes Sanjuana Kava, MD  gabapentin (NEURONTIN) 600 MG tablet Take 600 mg by mouth 3 (three) times daily.   Yes [provider]  LORazepam (ATIVAN) 2 MG tablet Take 2 mg by mouth at bedtime. 08/15/17  Yes [provider]  metoprolol (LOPRESSOR) 50  MG tablet Take 50 mg by mouth every evening.    Yes [provider]  omeprazole (PRILOSEC OTC) 20 MG tablet Take 20 mg by mouth 2 (two) times daily. Reported on 06/07/2015   Yes [provider]  oxyCODONE-acetaminophen (PERCOCET) 7.5-325 MG tablet Take 1 tablet by mouth every 4 (four) hours as needed. 10/01/17  Yes Sanjuana Kava, MD  polyethylene glycol-electrolytes (TRILYTE) 420 g solution Take 4,000 mLs by mouth as directed. 07/29/17  Yes Shaconda Hajduk, [name], MD  Tamsulosin HCl (FLOMAX) 0.4 MG CAPS Take 0.4 mg by mouth daily. Reported on 06/07/2015   Yes [provider]  venlafaxine (EFFEXOR-XR) 150 MG 24 hr capsule Take 150 mg by mouth every evening.    Yes [provider]  nitroGLYCERIN (NITROSTAT) 0.4 MG SL tablet Place 0.4 mg under the tongue every 5 (five) minutes x 3 doses as needed. For chest pain    [provider]    Allergies as of 07/29/2017 - Review Complete 07/24/2017  Allergen Reaction Noted  . Bee venom Anaphylaxis 06/07/2015  . Feldene [piroxicam] Rash and Dermatitis 07/20/2010    Family History  Problem Relation Age of Onset  . Colon cancer Maternal Grandmother   . Colon cancer Paternal Grandmother     Social History   Socioeconomic History  . Marital status: Married    Spouse name: Not  on file  . Number of children: Not on file  . Years of education: Not on file  . Highest education level: Not on file  Occupational History  . Not on file  Social Needs  . Financial resource strain: Not on file  . Food insecurity:    Worry: Not on file    Inability: Not on file  . Transportation needs:    Medical: Not on file    Non-medical: Not on file  Tobacco Use  . Smoking status: Current Every Day Smoker    Packs/day: 1.00    Years: 40.00    Pack years: 40.00    Types: Cigarettes  . Smokeless tobacco: Never Used  Substance and Sexual Activity  . Alcohol use: No  . Drug use: No  . Sexual activity: Yes    Partners: Female     Birth control/protection: None    Comment: spouse  Lifestyle  . Physical activity:    Days per week: Not on file    Minutes per session: Not on file  . Stress: Not on file  Relationships  . Social connections:    Talks on phone: Not on file    Gets together: Not on file    Attends religious service: Not on file    Active member of club or organization: Not on file    Attends meetings of clubs or organizations: Not on file    Relationship status: Not on file  . Intimate partner violence:    Fear of current or ex partner: Not on file    Emotionally abused: Not on file    Physically abused: Not on file    Forced sexual activity: Not on file  Other Topics Concern  . Not on file  Social History Narrative  . Not on file    Review of Systems: See HPI, otherwise negative ROS  Physical Exam: BP (!) 148/91   Pulse 65   Temp 98.1 F (36.7 C) (Oral)   Resp 13   Ht 6' (1.829 m)   Wt 215 lb (97.5 kg)   SpO2 94%   BMI 29.16 kg/m  General:   Alert,  Well-developed, well-nourished, pleasant and cooperative in NAD Neck:  Supple; no masses or thyromegaly. No significant cervical adenopathy. Lungs:  Clear throughout to auscultation.   No wheezes, crackles, or rhonchi. No acute distress. Heart:  Regular rate and rhythm; no murmurs, clicks, rubs,  or gallops. Abdomen: Non-distended, normal bowel sounds.  Soft and nontender without appreciable mass or hepatosplenomegaly.  Pulses:  Normal pulses noted. Extremities:  Without clubbing or edema.  Impression/plan:  72 year old gentleman with a right upper quadrant abdominal pain. History of colonic adenoma  Here for EGD and colonoscopy. Patient denies dysphagia. The risks, benefits, limitations, imponderables and alternatives regarding both EGD and colonoscopy have been reviewed with the patient. Questions have been answered. All parties agreeable.                          The risks, benefits, limitations, imponderables  and alternatives regarding both EGD and colonoscopy have been reviewed with the patient. Questions have been answered. All parties agreeable.

## 2017-10-07 NOTE — Progress Notes (Signed)
Referring Provider: Celene Squibb, MD Primary Care Physician:  Celene Squibb, MD Primary GI:  Dr. Gala Romney  Chief Complaint  Patient presents with  . Gastroesophageal Reflux    doing ok    HPI:   Chris Lopez is a 72 y.o. male who presents for follow-up on GERD and constipation.  The patient was last seen in our office 07/24/2017 for the same as well as right lower quadrant abdominal pain and right upper quadrant abdominal pain.  Per primary care last colonoscopy was 2017.  Also known history of iron deficiency anemia.  Most recent CBC normal, CMP normal except for elevated creatinine 1.21.  CT abdomen and pelvis completed 05/14/2017 with suspicion for developing acute appendicitis but no adjacent signs, multiple rectosigmoid colon diverticula, distal esophageal fluid query stricture versus dysmotility.  Previous colonoscopy in our system 08/07/2010  Which found polyps found to be tubular adenoma.  EGD same day found distal esophageal erosions consistent with erosive reflux esophagitis status post biopsy which was negative for H. pylori.  Recommended repeat colonoscopy in 2017.  At his last visit he was doing okay overall.  Right lower quadrant pain intermittent which is dull and last 4 to 6 days and abates for couple days.  Chronic pain medication.  No known triggers.  Intermittent/rare GERD symptoms.  Over-the-counter Prilosec and daily aspirin.  Variable stools and constipation the diarrhea which he attributes to medications.  Unintentional weight loss of 30 pounds in the last 6 months.  He was deemed due for repeat colonoscopy.  Recommended colonoscopy on propofol/MAC, MiraLAX 1-2 times a day, follow-up with progress report, follow-up in 2 months.  Colonoscopy completed 10/07/2017 which found an side effect less likely inflammatory bowel, single 5 mm polyp status post biopsy, mucosal ulceration status post biopsy, internal nonbleeding hemorrhoids.  Pathology still pending.  EGD completed the same day  found normal esophagus, mucosal changes in the cardia with nodularity and ulceration status post biopsy, normal duodenum.  Surgical pathology is pending.  Today he states he's doing ok overall, had endoscopic procedures yesterday. Denies abdominal pain, N/V, hematochezia, melena, fever, chills, unintentional weight loss. GERD doing well on PPI. Constipation is "unknown" because he hasn't had a bowel movement since his procedure; prior to the procedure he was having constipation. He is not currently on constipation treatment. Take chronic pain medication regularly.  Intermittent NSAIDs ("if hurting real bad).  Past Medical History:  Diagnosis Date  . Anxiety   . Arthritis   . BPH (benign prostatic hyperplasia)   . Chronic bronchitis   . COPD (chronic obstructive pulmonary disease) (Uncertain)   . Depression   . Diabetes mellitus without complication ([place])    Resolved; not needing medications at this time.  Marland Kitchen GERD (gastroesophageal reflux disease)   . Hypertension     Past Surgical History:  Procedure Laterality Date  . carpal tunnel right hand    . CERVICAL FUSION    . left knee replacement    . left knee revised X 3    . multiple knee arthroscopies    . right knee replacement    . tarsal tunnel repair    . THYROIDECTOMY, PARTIAL    . TONSILLECTOMY      Current Outpatient Medications  Medication Sig Dispense Refill  . albuterol (PROVENTIL HFA;VENTOLIN HFA) 108 (90 Base) MCG/ACT inhaler Inhale 2 puffs into the lungs every 6 (six) hours as needed for wheezing or shortness of breath.    Marland Kitchen amLODipine (NORVASC) 5  MG tablet Take 5 mg by mouth every evening.    Marland Kitchen aspirin 325 MG tablet Take 325 mg by mouth daily.     Marland Kitchen atorvastatin (LIPITOR) 20 MG tablet Take 20 mg by mouth every evening. Reported on 06/07/2015    . diclofenac (VOLTAREN) 75 MG EC tablet TAKE 1 TABLET TWICE DAILY 180 tablet 5  . gabapentin (NEURONTIN) 600 MG tablet Take 600 mg by mouth 3 (three) times daily.    Marland Kitchen LORazepam  (ATIVAN) 2 MG tablet Take 2 mg by mouth at bedtime.    . metoprolol (LOPRESSOR) 50 MG tablet Take 50 mg by mouth every evening.     . nitroGLYCERIN (NITROSTAT) 0.4 MG SL tablet Place 0.4 mg under the tongue every 5 (five) minutes x 3 doses as needed. For chest pain    . omeprazole (PRILOSEC OTC) 20 MG tablet Take 20 mg by mouth 2 (two) times daily. Reported on 06/07/2015    . oxyCODONE-acetaminophen (PERCOCET) 7.5-325 MG tablet Take 1 tablet by mouth every 4 (four) hours as needed. 90 tablet 0  . Tamsulosin HCl (FLOMAX) 0.4 MG CAPS Take 0.4 mg by mouth daily. Reported on 06/07/2015    . venlafaxine (EFFEXOR-XR) 150 MG 24 hr capsule Take 150 mg by mouth every evening.     . polyethylene glycol-electrolytes (TRILYTE) 420 g solution Take 4,000 mLs by mouth as directed. (Patient not taking: Reported on 10/08/2017) 4000 mL 0   No current facility-administered medications for this visit.     Allergies as of 10/08/2017 - Review Complete 10/08/2017  Allergen Reaction Noted  . Bee venom Anaphylaxis 06/07/2015  . Feldene [piroxicam] Rash and Dermatitis 07/20/2010    Family History  Problem Relation Age of Onset  . Colon cancer Maternal Grandmother   . Colon cancer Paternal Grandmother     Social History   Socioeconomic History  . Marital status: Married    Spouse name: Not on file  . Number of children: Not on file  . Years of education: Not on file  . Highest education level: Not on file  Occupational History  . Not on file  Social Needs  . Financial resource strain: Not on file  . Food insecurity:    Worry: Not on file    Inability: Not on file  . Transportation needs:    Medical: Not on file    Non-medical: Not on file  Tobacco Use  . Smoking status: Current Every Day Smoker    Packs/day: 1.00    Years: 40.00    Pack years: 40.00    Types: Cigarettes  . Smokeless tobacco: Never Used  Substance and Sexual Activity  . Alcohol use: No  . Drug use: No  . Sexual activity: Yes     Partners: Female    Birth control/protection: None    Comment: spouse  Lifestyle  . Physical activity:    Days per week: Not on file    Minutes per session: Not on file  . Stress: Not on file  Relationships  . Social connections:    Talks on phone: Not on file    Gets together: Not on file    Attends religious service: Not on file    Active member of club or organization: Not on file    Attends meetings of clubs or organizations: Not on file    Relationship status: Not on file  Other Topics Concern  . Not on file  Social History Narrative  . Not on file  Review of Systems: General: Negative for anorexia, weight loss, fever, chills, fatigue, weakness. ENT: Negative for hoarseness, difficulty swallowing. CV: Negative for chest pain, angina, palpitations, peripheral edema.  Respiratory: Negative for dyspnea at rest, cough, sputum, wheezing.  GI: See history of present illness. MS: Admits chronic pain.  Derm: Negative for rash or itching.  Endo: Negative for unusual weight change.  Heme: Negative for bruising or bleeding. Allergy: Negative for rash or hives.   Physical Exam: BP 136/77   Pulse 65   Temp (!) 97 F (36.1 C) (Oral)   Ht 6' (1.829 m)   Wt 213 lb 3.2 oz (96.7 kg)   BMI 28.92 kg/m  General:   Alert and oriented. Pleasant and cooperative. Well-nourished and well-developed.  Eyes:  Without icterus, sclera clear and conjunctiva pink.  Ears:  Normal auditory acuity. Cardiovascular:  S1, S2 present without murmurs appreciated. Extremities without clubbing or edema. Respiratory:  Clear to auscultation bilaterally. No wheezes, rales, or rhonchi. No distress.  Gastrointestinal:  +BS, soft, non-tender and non-distended. No HSM noted. No guarding or rebound. No masses appreciated.  Rectal:  Deferred  Musculoskalatal:  Symmetrical without gross deformities. Ambulates with a cane, altered gait. Skin:  Intact without significant lesions or rashes. Neurologic:  Alert  and oriented x4;  grossly normal neurologically. Psych:  Alert and cooperative. Normal mood and affect. Heme/Lymph/Immune: No excessive bruising noted.    10/08/2017 8:15 AM   Disclaimer: This note was dictated with voice recognition software. Similar sounding words can inadvertently be transcribed and may not be corrected upon review.

## 2017-10-07 NOTE — Discharge Instructions (Signed)
Colon Polyps °Polyps are tissue growths inside the body. Polyps can grow in many places, including the large intestine (colon). A polyp may be a round bump or a mushroom-shaped growth. You could have one polyp or several. °Most colon polyps are noncancerous (benign). However, some colon polyps can become cancerous over time. °What are the causes? °The exact cause of colon polyps is not known. °What increases the risk? °This condition is more likely to develop in people who: °· Have a family history of colon cancer or colon polyps. °· Are older than 50 or older than 45 if they are African American. °· Have inflammatory bowel disease, such as ulcerative colitis or Crohn disease. °· Are overweight. °· Smoke cigarettes. °· Do not get enough exercise. °· Drink too much alcohol. °· Eat a diet that is: °? High in fat and red meat. °? Low in fiber. °· Had childhood cancer that was treated with abdominal radiation. ° °What are the signs or symptoms? °Most polyps do not cause symptoms. If you have symptoms, they may include: °· Blood coming from your rectum when having a bowel movement. °· Blood in your stool. The stool may look dark red or black. °· A change in bowel habits, such as constipation or diarrhea. ° °How is this diagnosed? °This condition is diagnosed with a colonoscopy. This is a procedure that uses a lighted, flexible scope to look at the inside of your colon. °How is this treated? °Treatment for this condition involves removing any polyps that are found. Those polyps will then be tested for cancer. If cancer is found, your health care provider will talk to you about options for colon cancer treatment. °Follow these instructions at home: °Diet °· Eat plenty of fiber, such as fruits, vegetables, and whole grains. °· Eat foods that are high in calcium and vitamin D, such as milk, cheese, yogurt, eggs, liver, fish, and broccoli. °· Limit foods high in fat, red meats, and processed meats, such as hot dogs, sausage,  bacon, and lunch meats. °· Maintain a healthy weight, or lose weight if recommended by your health care provider. °General instructions °· Do not smoke cigarettes. °· Do not drink alcohol excessively. °· Keep all follow-up visits as told by your health care provider. This is important. This includes keeping regularly scheduled colonoscopies. Talk to your health care provider about when you need a colonoscopy. °· Exercise every day or as told by your health care provider. °Contact a health care provider if: °· You have new or worsening bleeding during a bowel movement. °· You have new or increased blood in your stool. °· You have a change in bowel habits. °· You unexpectedly lose weight. °This information is not intended to replace advice given to you by your health care provider. Make sure you discuss any questions you have with your health care provider. °Document Released: 12/21/2003 Document Revised: 09/01/2015 Document Reviewed: 02/14/2015 °Elsevier Interactive Patient Education © 2018 [employer] ° °Colonoscopy °Discharge Instructions ° °Read the instructions outlined below and refer to this sheet in the next few weeks. These discharge instructions provide you with general information on caring for yourself after you leave the hospital. Your doctor may also give you specific instructions. While your treatment has been planned according to the most current medical practices available, unavoidable complications occasionally occur. If you have any problems or questions after discharge, call Dr. Rourk at [phone]. °ACTIVITY °· You may resume your regular activity, but move at a slower pace for the next 24   hours.   Take frequent rest periods for the next 24 hours.   Walking will help get rid of the air and reduce the bloated feeling in your belly (abdomen).   No driving for 24 hours (because of the medicine (anesthesia) used during the test).    Do not sign any important legal documents or operate any  machinery for 24 hours (because of the anesthesia used during the test).  NUTRITION  Drink plenty of fluids.   You may resume your normal diet as instructed by your doctor.   Begin with a light meal and progress to your normal diet. Heavy or fried foods are harder to digest and may make you feel sick to your stomach (nauseated).   Avoid alcoholic beverages for 24 hours or as instructed.  MEDICATIONS  You may resume your normal medications unless your doctor tells you otherwise.  WHAT YOU CAN EXPECT TODAY  Some feelings of bloating in the abdomen.   Passage of more gas than usual.   Spotting of blood in your stool or on the toilet paper.  IF YOU HAD POLYPS REMOVED DURING THE COLONOSCOPY:  No aspirin products for 7 days or as instructed.   No alcohol for 7 days or as instructed.   Eat a soft diet for the next 24 hours.  FINDING OUT THE RESULTS OF YOUR TEST Not all test results are available during your visit. If your test results are not back during the visit, make an appointment with your caregiver to find out the results. Do not assume everything is normal if you have not heard from your caregiver or the medical facility. It is important for you to follow up on all of your test results.  SEEK IMMEDIATE MEDICAL ATTENTION IF:  You have more than a spotting of blood in your stool.   Your belly is swollen (abdominal distention).   You are nauseated or vomiting.   You have a temperature over 101.   You have abdominal pain or discomfort that is severe or gets worse throughout the day.  EGD Discharge instructions Please read the instructions outlined below and refer to this sheet in the next few weeks. These discharge instructions provide you with general information on caring for yourself after you leave the hospital. Your doctor may also give you specific instructions. While your treatment has been planned according to the most current medical practices available, unavoidable  complications occasionally occur. If you have any problems or questions after discharge, please call your doctor. ACTIVITY  You may resume your regular activity but move at a slower pace for the next 24 hours.   Take frequent rest periods for the next 24 hours.   Walking will help expel (get rid of) the air and reduce the bloated feeling in your abdomen.   No driving for 24 hours (because of the anesthesia (medicine) used during the test).   You may shower.   Do not sign any important legal documents or operate any machinery for 24 hours (because of the anesthesia used during the test).  NUTRITION  Drink plenty of fluids.   You may resume your normal diet.   Begin with a light meal and progress to your normal diet.   Avoid alcoholic beverages for 24 hours or as instructed by your caregiver.  MEDICATIONS  You may resume your normal medications unless your caregiver tells you otherwise.  WHAT YOU CAN EXPECT TODAY  You may experience abdominal discomfort such as a feeling of fullness or  gas pains.  FOLLOW-UP  Your doctor will discuss the results of your test with you.  SEEK IMMEDIATE MEDICAL ATTENTION IF ANY OF THE FOLLOWING OCCUR:  Excessive nausea (feeling sick to your stomach) and/or vomiting.   Severe abdominal pain and distention (swelling).   Trouble swallowing.   Temperature over 101 F (37.8 C).   Rectal bleeding or vomiting of blood.   Diverticulosis Diverticulosis is a condition that develops when small pouches (diverticula) form in the wall of the large intestine (colon). The colon is where water is absorbed and stool is formed. The pouches form when the inside layer of the colon pushes through weak spots in the outer layers of the colon. You may have a few pouches or many of them. What are the causes? The cause of this condition is not known. What increases the risk? The following factors may make you more likely to develop this condition: Being older  than age 16. Your risk for this condition increases with age. Diverticulosis is rare among people younger than age 79. By age 56, many people have it. Eating a low-fiber diet. Having frequent constipation. Being overweight. Not getting enough exercise. Smoking. Taking over-the-counter pain medicines, like aspirin and ibuprofen. Having a family history of diverticulosis.  What are the signs or symptoms? In most people, there are no symptoms of this condition. If you do have symptoms, they may include: Bloating. Cramps in the abdomen. Constipation or diarrhea. Pain in the lower left side of the abdomen.  How is this diagnosed? This condition is most often diagnosed during an exam for other colon problems. Because diverticulosis usually has no symptoms, it often cannot be diagnosed independently. This condition may be diagnosed by: Using a flexible scope to examine the colon (colonoscopy). Taking an X-ray of the colon after dye has been put into the colon (barium enema). Doing a CT scan.  How is this treated? You may not need treatment for this condition if you have never developed an infection related to diverticulosis. If you have had an infection before, treatment may include: Eating a high-fiber diet. This may include eating more fruits, vegetables, and grains. Taking a fiber supplement. Taking a live bacteria supplement (probiotic). Taking medicine to relax your colon. Taking antibiotic medicines.  Follow these instructions at home: Drink 6-8 glasses of water or more each day to prevent constipation. Try not to strain when you have a bowel movement. If you have had an infection before: Eat more fiber as directed by your health care provider or your diet and nutrition specialist (dietitian). Take a fiber supplement or probiotic, if your health care provider approves. Take over-the-counter and prescription medicines only as told by your health care provider. If you were  prescribed an antibiotic, take it as told by your health care provider. Do not stop taking the antibiotic even if you start to feel better. Keep all follow-up visits as told by your health care provider. This is important. Contact a health care provider if: You have pain in your abdomen. You have bloating. You have cramps. You have not had a bowel movement in 3 days. Get help right away if: Your pain gets worse. Your bloating becomes very bad. You have a fever or chills, and your symptoms suddenly get worse. You vomit. You have bowel movements that are bloody or black. You have bleeding from your rectum. Summary Diverticulosis is a condition that develops when small pouches (diverticula) form in the wall of the large intestine (colon). You may  have a few pouches or many of them. This condition is most often diagnosed during an exam for other colon problems. If you have had an infection related to diverticulosis, treatment may include increasing the fiber in your diet, taking supplements, or taking medicines. This information is not intended to replace advice given to you by your health care provider. Make sure you discuss any questions you have with your health care provider. Document Released: 12/22/2003 Document Revised: 02/13/2016 Document Reviewed: 02/13/2016 Elsevier Interactive Patient Education  2017 [employer].    Further recommendations to follow pending review of pathology report  Polyp and diverticulosis information provided

## 2017-10-08 ENCOUNTER — Ambulatory Visit (INDEPENDENT_AMBULATORY_CARE_PROVIDER_SITE_OTHER): Payer: Medicare HMO | Admitting: Nurse Practitioner

## 2017-10-08 ENCOUNTER — Encounter: Payer: Self-pay | Admitting: Nurse Practitioner

## 2017-10-08 VITALS — BP 136/77 | HR 65 | Temp 97.0°F | Ht 72.0 in | Wt 213.2 lb

## 2017-10-08 DIAGNOSIS — K5900 Constipation, unspecified: Secondary | ICD-10-CM | POA: Diagnosis not present

## 2017-10-08 DIAGNOSIS — K219 Gastro-esophageal reflux disease without esophagitis: Secondary | ICD-10-CM

## 2017-10-08 NOTE — Patient Instructions (Signed)
1. Continue taking your acid blocker. 2. I am giving you samples of Movantik 25 mg.  Take this once a day, only while you are taking pain medicines. 3. Call us in 1 to 2 weeks and let us know if it is helping your constipation 4. Return for follow-up in 3 months. 5. Call us if you have any questions or concerns.  At [facility] we value your feedback. You may receive a survey about your visit today. Please share your experience as we strive to create trusting relationships with our patients to provide genuine, compassionate, quality care.  It was great to see you today!  I hope you have a great 4th of July!!

## 2017-10-08 NOTE — Assessment & Plan Note (Signed)
GERD currently well controlled on PPI.  Recommend he continue this.  Discussed avoidance of NSAIDs given his findings on endoscopy and colonoscopy.  Reviewed reports and images with him, per his request.  Discussed that we will send him a letter with pathology results when we receive them.  Follow-up in 3 months.

## 2017-10-08 NOTE — Progress Notes (Signed)
cc'ed to pcp °

## 2017-10-08 NOTE — Assessment & Plan Note (Signed)
He has not had a bowel movement since his procedure yesterday morning.  He was having persistent constipation before his procedure, however.  He is on chronic pain medications.  I will trial him on Movantik 25 mg daily with samples last 1 or 2 weeks and request a progress report.  Follow-up in 3 months.

## 2017-10-09 ENCOUNTER — Telehealth: Payer: Self-pay | Admitting: Internal Medicine

## 2017-10-09 ENCOUNTER — Telehealth: Payer: Self-pay | Admitting: *Deleted

## 2017-10-09 ENCOUNTER — Other Ambulatory Visit: Payer: Self-pay | Admitting: *Deleted

## 2017-10-09 DIAGNOSIS — C8310 Mantle cell lymphoma, unspecified site: Secondary | ICD-10-CM

## 2017-10-09 NOTE — Telephone Encounter (Signed)
Dr. Gari Crown - pathologist called.  Bx of stomach and ICV ulcer showed mantle cell lymphoma.  I called pt and informed him. Discussed w MS att office.  Will get an oncology referal asap.

## 2017-10-09 NOTE — Telephone Encounter (Signed)
Per RMR, get patient an ASAP appointment with [facility] for dx matle cell lymphoma. Referral has been placed.

## 2017-10-11 ENCOUNTER — Encounter (HOSPITAL_COMMUNITY): Payer: Self-pay | Admitting: *Deleted

## 2017-10-11 ENCOUNTER — Encounter (HOSPITAL_COMMUNITY): Payer: Self-pay | Admitting: Internal Medicine

## 2017-10-11 NOTE — Progress Notes (Signed)
Patient returned my call.  patient expressed concerns and fear about newly diagnosis.  I allowed patient to voice his concerns and provided support.  I assured him that we will help him and educate him through every step of his journey. Patient voiced some comfort in knowing he had someone to come to for questions.  I advised patient of his up coming appointments and he verbalizes understanding.  I encouraged him to bring support persons to his appointments and treatments.

## 2017-10-11 NOTE — Progress Notes (Addendum)
I called and left message on patient's voicemail introducing myself as oncology navigator for [facility].  I advised him to expect a phone call from interventional radiology about putting in a port-a-cath.  I provided my direct contact number so that I can be reached if he has any questions or concerns.

## 2017-10-14 ENCOUNTER — Other Ambulatory Visit (HOSPITAL_COMMUNITY): Payer: Self-pay | Admitting: Nurse Practitioner

## 2017-10-14 DIAGNOSIS — C8316 Mantle cell lymphoma, intrapelvic lymph nodes: Secondary | ICD-10-CM

## 2017-10-17 ENCOUNTER — Encounter (HOSPITAL_COMMUNITY): Payer: Self-pay | Admitting: Hematology

## 2017-10-17 ENCOUNTER — Inpatient Hospital Stay (HOSPITAL_COMMUNITY): Payer: Medicare HMO | Attending: Hematology | Admitting: Hematology

## 2017-10-17 ENCOUNTER — Other Ambulatory Visit (HOSPITAL_COMMUNITY): Payer: Self-pay | Admitting: Nurse Practitioner

## 2017-10-17 ENCOUNTER — Other Ambulatory Visit: Payer: Self-pay

## 2017-10-17 ENCOUNTER — Encounter (HOSPITAL_COMMUNITY): Payer: Self-pay | Admitting: *Deleted

## 2017-10-17 ENCOUNTER — Inpatient Hospital Stay (HOSPITAL_COMMUNITY): Payer: Medicare HMO

## 2017-10-17 VITALS — BP 127/68 | HR 64 | Temp 97.8°F | Resp 18 | Ht 72.0 in | Wt 212.9 lb

## 2017-10-17 DIAGNOSIS — R61 Generalized hyperhidrosis: Secondary | ICD-10-CM | POA: Diagnosis not present

## 2017-10-17 DIAGNOSIS — L539 Erythematous condition, unspecified: Secondary | ICD-10-CM | POA: Insufficient documentation

## 2017-10-17 DIAGNOSIS — C8316 Mantle cell lymphoma, intrapelvic lymph nodes: Secondary | ICD-10-CM

## 2017-10-17 DIAGNOSIS — C8310 Mantle cell lymphoma, unspecified site: Secondary | ICD-10-CM | POA: Insufficient documentation

## 2017-10-17 DIAGNOSIS — Z5111 Encounter for antineoplastic chemotherapy: Secondary | ICD-10-CM | POA: Insufficient documentation

## 2017-10-17 DIAGNOSIS — Z79899 Other long term (current) drug therapy: Secondary | ICD-10-CM | POA: Diagnosis not present

## 2017-10-17 DIAGNOSIS — C8318 Mantle cell lymphoma, lymph nodes of multiple sites: Secondary | ICD-10-CM

## 2017-10-17 LAB — CBC WITH DIFFERENTIAL/PLATELET
BASOS PCT: 1 %
Basophils Absolute: 0.1 10*3/uL (ref 0.0–0.1)
EOS PCT: 4 %
Eosinophils Absolute: 0.4 10*3/uL (ref 0.0–0.7)
HCT: 39.1 % (ref 39.0–52.0)
HEMOGLOBIN: 13 g/dL (ref 13.0–17.0)
LYMPHS ABS: 2.4 10*3/uL (ref 0.7–4.0)
Lymphocytes Relative: 26 %
MCH: 28.8 pg (ref 26.0–34.0)
MCHC: 33.2 g/dL (ref 30.0–36.0)
MCV: 86.7 fL (ref 78.0–100.0)
MONOS PCT: 7 %
Monocytes Absolute: 0.6 10*3/uL (ref 0.1–1.0)
NEUTROS PCT: 62 %
Neutro Abs: 6 10*3/uL (ref 1.7–7.7)
Platelets: 153 10*3/uL (ref 150–400)
RBC: 4.51 MIL/uL (ref 4.22–5.81)
RDW: 14.4 % (ref 11.5–15.5)
WBC: 9.4 10*3/uL (ref 4.0–10.5)

## 2017-10-17 LAB — HEPATIC FUNCTION PANEL
ALBUMIN: 3.6 g/dL (ref 3.5–5.0)
ALK PHOS: 92 U/L (ref 38–126)
ALT: 13 U/L (ref 0–44)
AST: 15 U/L (ref 15–41)
BILIRUBIN INDIRECT: 0.7 mg/dL (ref 0.3–0.9)
Bilirubin, Direct: 0.1 mg/dL (ref 0.0–0.2)
TOTAL PROTEIN: 6.7 g/dL (ref 6.5–8.1)
Total Bilirubin: 0.8 mg/dL (ref 0.3–1.2)

## 2017-10-17 LAB — LACTATE DEHYDROGENASE: LDH: 99 U/L (ref 98–192)

## 2017-10-17 LAB — URIC ACID: URIC ACID, SERUM: 5.4 mg/dL (ref 3.7–8.6)

## 2017-10-17 NOTE — Progress Notes (Signed)
I spoke with Thressa Sheller, MD in pathology and ordered [id number] on specimen accession # [id number].    She then spoke with Dr. Delton Coombes and informed him that this is a send out test.

## 2017-10-17 NOTE — Progress Notes (Signed)
[facility] NOTE  Patient Care Team: Celene Squibb, MD as PCP - General (Internal Medicine) Gala Romney [name], MD (Gastroenterology)  CHIEF COMPLAINTS/PURPOSE OF CONSULTATION:  Mantle cell lymphoma on biopsies during colonoscopy and endoscopy.  HISTORY OF PRESENTING ILLNESS:  Chris Lopez 72 y.o. male is seen in consultation today for further work-up and management of newly diagnosed mantle cell lymphoma.  He has been having on and off right-sided abdominal pain for the past 4 to 6 months.  He also feels the right loin area is slightly swollen.  He reports decreased energy levels and decreased appetite.  He is also having night sweats for the past 2 years once every 2 to 3 weeks.  Denies any significant weight loss in the last 6 months.  However he had unintentional weight loss of 88 pounds over 6 months 2 years ago.  He underwent EGD and colonoscopy on 10/07/2017.  EGD showed normal esophagus, nodularity with ulceration in the cardia at GE junction on the gastric side which was biopsied.  Colonoscopy showed 5 mm polyp in the transverse colon which was resected.  Ileocecal valve mucosal ulceration was also biopsied.  All 3 biopsies were showing atypical lymphoid infiltrate.  This was positive for CD20, CD79a, CD5 and cyclin D1.  He is able to do all his activities at home.  His ambulation is limited by bilateral knee replacements and multiple revisions.  He has left knee pain going down to the left leg.  He is an active smoker, smoked 50 pack years.  He worked as an Art gallery manager prior to retirement.  He reports work-related asbestos exposure.  He also reports using Roundup since 1970s around his house, in the spring and fall.  He also worked at [employer]. Family history was significant for father having prostate cancer.  Grandmothers both maternal and paternal had colon cancer.  MEDICAL HISTORY:  Past Medical History:  Diagnosis Date  . Anxiety   .  Arthritis   . BPH (benign prostatic hyperplasia)   . Chronic bronchitis   . COPD (chronic obstructive pulmonary disease) ([place])   . Depression   . Diabetes mellitus without complication ([place])    Resolved; not needing medications at this time.  Marland Kitchen GERD (gastroesophageal reflux disease)   . Hypertension   . Lymphoma, mantle cell ([place])     SURGICAL HISTORY: Past Surgical History:  Procedure Laterality Date  . BIOPSY  10/07/2017   Procedure: BIOPSY;  Surgeon: Daneil Dolin, MD;  [facility];  Service: Endoscopy;;  gastroesophageal junction bx ileocecal valve  . carpal tunnel right hand    . CERVICAL FUSION    . COLONOSCOPY WITH PROPOFOL N/A 10/07/2017   Procedure: COLONOSCOPY WITH PROPOFOL;  Surgeon: Daneil Dolin, MD;  [facility];  Service: Endoscopy;  Laterality: N/A;  10:00am  . ESOPHAGOGASTRODUODENOSCOPY (EGD) WITH PROPOFOL N/A 10/07/2017   Procedure: ESOPHAGOGASTRODUODENOSCOPY (EGD) WITH PROPOFOL;  Surgeon: Daneil Dolin, MD;  [facility];  Service: Endoscopy;  Laterality: N/A;  . left knee replacement    . left knee revised X 3    . multiple knee arthroscopies    . POLYPECTOMY  10/07/2017   Procedure: POLYPECTOMY;  Surgeon: Daneil Dolin, MD;  [facility];  Service: Endoscopy;;  colon  . right knee replacement    . tarsal tunnel repair    . THYROIDECTOMY, PARTIAL    . TONSILLECTOMY      SOCIAL  HISTORY: Social History   Socioeconomic History  . Marital status: Married    Spouse name: Jonelle Sidle  . Number of children: 4  . Years of education: Not on file  . Highest education level: Bachelor's degree (e.g., BA, AB, BS)  Occupational History  . 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 Financial resource strain: Somewhat hard  . Food insecurity:    Worry: Sometimes true    Inability:  Never true  . Transportation needs:    Medical: No    Non-medical: No  Tobacco Use  . Smoking status: Current Every Day Smoker    Packs/day: 1.25    Years: 50.00    Pack years: 62.50    Types: Cigarettes  . Smokeless tobacco: Never Used  Substance and Sexual Activity  . Alcohol use: No  . Drug use: No  . Sexual activity: Yes    Partners: Female    Birth control/protection: None    Comment: spouse  Lifestyle  . Physical activity:    Days per week: 3 days    Minutes per session: 150+ min  . Stress: Very much  Relationships  . Social connections:    Talks on phone: Once a week    Gets together: Never    Attends religious service: Never    Active member of club or organization: No    Attends meetings of clubs or organizations: Never    Relationship status: Married  . Intimate partner violence:    Fear of current or ex partner: No    Emotionally abused: No    Physically abused: No    Forced sexual activity: No  Other Topics Concern  . Not on file  Social History Narrative  . Not on file    FAMILY HISTORY: Family History  Problem Relation Age of Onset  . Colon cancer Maternal Grandmother   . Colon cancer Paternal Grandmother   . Heart disease Mother   . Arthritis Mother   . Prostate cancer Father   . Heart attack Brother   . Heart disease Maternal Aunt   . Cancer Maternal Aunt   . Heart disease Maternal Uncle   . Cancer Maternal Uncle   . Heart disease Paternal Aunt   . Heart disease Paternal Uncle   . Heart disease Brother   . Heart attack Brother   . Arthritis Brother     ALLERGIES:  is allergic to bee venom and feldene [piroxicam].  MEDICATIONS:  Current Outpatient Medications  Medication Sig Dispense Refill  . albuterol (PROVENTIL HFA;VENTOLIN HFA) 108 (90 Base) MCG/ACT inhaler Inhale 2 puffs into the lungs every 6 (six) hours as needed for wheezing or shortness of breath.    Marland Kitchen amLODipine (NORVASC) 5 MG tablet Take 5 mg by mouth every evening.    Marland Kitchen  aspirin 325 MG tablet Take 325 mg by mouth daily.     Marland Kitchen atorvastatin (LIPITOR) 20 MG tablet Take 20 mg by mouth every evening. Reported on 06/07/2015    . diclofenac (VOLTAREN) 75 MG EC tablet TAKE 1 TABLET TWICE DAILY 180 tablet 5  . gabapentin (NEURONTIN) 600 MG tablet Take 600 mg by mouth 3 (three) times daily.    Marland Kitchen LORazepam (ATIVAN) 2 MG tablet Take 2 mg by mouth at bedtime.    . metoprolol (LOPRESSOR) 50 MG  tablet Take 50 mg by mouth every evening.     . nitroGLYCERIN (NITROSTAT) 0.4 MG SL tablet Place 0.4 mg under the tongue every 5 (five) minutes x 3 doses as needed. For chest pain    . omeprazole (PRILOSEC OTC) 20 MG tablet Take 20 mg by mouth 2 (two) times daily. Reported on 06/07/2015    . oxyCODONE-acetaminophen (PERCOCET) 7.5-325 MG tablet Take 1 tablet by mouth every 4 (four) hours as needed. 90 tablet 0  . polyethylene glycol-electrolytes (TRILYTE) 420 g solution Take 4,000 mLs by mouth as directed. 4000 mL 0  . Tamsulosin HCl (FLOMAX) 0.4 MG CAPS Take 0.4 mg by mouth daily. Reported on 06/07/2015    . venlafaxine (EFFEXOR-XR) 150 MG 24 hr capsule Take 150 mg by mouth every evening.      No current facility-administered medications for this visit.     REVIEW OF SYSTEMS:   Constitutional: Denies fevers, chills.  Positive for night sweats once 2 to 3 weeks. Eyes: Complains of right eye blurring over the last few weeks. Ears, nose, mouth, throat, and face: Denies mucositis or sore throat Respiratory: Denies cough, dyspnea or wheezes Cardiovascular: Denies palpitation, chest discomfort or lower extremity swelling Gastrointestinal:  Denies nausea, heartburn or change in bowel habits.  Denies any bleeding per rectum. Skin: Denies abnormal skin rashes Lymphatics: Denies new lymphadenopathy or easy bruising Neurological:Denies numbness, tingling or new weaknesses Behavioral/Psych: Mood is stable, no new changes  All other systems were reviewed with the patient and are  negative.  PHYSICAL EXAMINATION: ECOG PERFORMANCE STATUS: 1 - Symptomatic but completely ambulatory  Vitals:   10/17/17 1158  BP: 127/68  Pulse: 64  Resp: 18  Temp: 97.8 F (36.6 C)  SpO2: 96%   Filed Weights   10/17/17 1158  Weight: 212 lb 14.4 oz (96.6 kg)    GENERAL:alert, no distress and comfortable SKIN: skin color, texture, turgor are normal, no rashes or significant lesions EYES: normal, conjunctiva are pink and non-injected, sclera clear OROPHARYNX:no exudate, no erythema and lips, buccal mucosa, and tongue normal  NECK: supple, thyroid normal size, non-tender, without nodularity LYMPH:  no palpable lymphadenopathy in the cervical, axillary or inguinal LUNGS: clear to auscultation and percussion with normal breathing effort HEART: regular rate & rhythm and no murmurs and no lower extremity edema ABDOMEN:abdomen soft, non-tender and normal bowel sounds.  No palpable hepatospleno megaly. PSYCH: alert & oriented x 3 with fluent speech   LABORATORY DATA:  I have reviewed the data as listed Lab Results  Component Value Date   WBC 8.4 10/02/2017   HGB 12.7 (L) 10/02/2017   HCT 38.9 (L) 10/02/2017   MCV 88.4 10/02/2017   PLT 158 10/02/2017     Chemistry      Component Value Date/Time   NA 138 10/02/2017 0824   K 4.6 10/02/2017 0824   CL 103 10/02/2017 0824   CO2 28 10/02/2017 0824   BUN 12 10/02/2017 0824   CREATININE 1.11 10/02/2017 0824      Component Value Date/Time   CALCIUM 8.2 (L) 10/02/2017 0824   ALKPHOS 81 03/22/2008 1210   AST 21 03/22/2008 1210   ALT 30 03/22/2008 1210   BILITOT 0.6 03/22/2008 1210       RADIOGRAPHIC STUDIES: I have personally reviewed CT scan of the abdomen and pelvis dated 05/14/2017 which showed prominence in the appendix with no evidence of lymphadenopathy.  ASSESSMENT & PLAN:  Mantle cell lymphoma ([place]) 1.  Mantle cell lymphoma involving GI tract: -  Night sweats once every 2 to 3 weeks for the past 2 years, no recent  weight loss, 88 pound weight loss over 6 months 2 years ago, right-sided abdominal pain for the last 4 to 6 months, with severe fatigue -EGD on 10/07/2017 showing normal esophagus, nodularity with ulceration at GE junction on gastric side in the cardia, biopsy of which consistent with mantle cell lymphoma -Colonoscopy on 10/07/2017 with 5 mm polyp in transverse colon resected positive for lymphoma, ileocecal valve mucosal ulceration biopsied positive for lymphoma - I have discussed the pathology report with the patient and his wife in detail.  He will need further work-up in the form of whole body PET CT scan for accurate staging.  I will also order MRI of the brain with and without contrast as he is complaining of right-sided headaches and right eye blurring for the past few weeks.  We will also obtain a 2D echocardiogram to evaluate his baseline ejection fraction.  I will check his CBC with differential, LFTs, beta-2 microglobulin, uric acid, LDH and hepatitis panel.  We will also request sox 11 on pathology for determination of indolent versus aggressive disease.  We will arrange for a bone marrow aspiration and biopsy to rule out involvement.  He is already scheduled for a Port-A-Cath on 22nd of this month.  I will see him back after the above-mentioned work-up.  Orders Placed This Encounter  Procedures  . MR Brain W Wo Contrast    Standing Status:   Future    Standing Expiration Date:   10/17/2018    Order Specific Question:   ** REASON FOR EXAM (FREE TEXT)    Answer:   right side blurring of vision and right sided headaches    Order Specific Question:   If indicated for the ordered procedure, I authorize the administration of contrast media per Radiology protocol    Answer:   Yes    Order Specific Question:   What is the patient's sedation requirement?    Answer:   No Sedation    Order Specific Question:   Does the patient have a pacemaker or implanted devices?    Answer:   No    Order Specific  Question:   Use SRS Protocol?    Answer:   Yes    Order Specific Question:   Radiology Contrast Protocol - do NOT remove file path    Answer:   \\[email or web address]\[email or web address]    Order Specific Question:   Preferred imaging location?    Answer:   [facility] (table limit-350lbs)  . CT BONE MARROW BIOPSY & ASPIRATION    Standing Status:   Future    Standing Expiration Date:   01/18/2019    Order Specific Question:   Reason for Exam (SYMPTOM  OR DIAGNOSIS REQUIRED)    Answer:   mantle cell lymphoma    Order Specific Question:   Preferred imaging location?    Answer:   [facility]    Order Specific Question:   Radiology Contrast Protocol - do NOT remove file path    Answer:   \\[email or web address]\Radiant\CTProtocols.pdf  . NM PET Image Initial (PI) Skull Base To Thigh    Standing Status:   Future    Standing Expiration Date:   10/17/2018    Order Specific Question:   ** REASON FOR EXAM (FREE TEXT)    Answer:   mantle cell lymphoma    Order Specific Question:   If indicated for the ordered procedure, I authorize the  administration of a radiopharmaceutical per Radiology protocol    Answer:   Yes    Order Specific Question:   Preferred imaging location?    Answer:   [facility]    Order Specific Question:   Radiology Contrast Protocol - do NOT remove file path    Answer:   \\[email or web address]\[email or web address]  . CT Biopsy    Bone marrow biospy    Standing Status:   Future    Standing Expiration Date:   10/17/2018    Order Specific Question:   Lab orders requested (DO NOT place separate lab orders, these will be automatically ordered during procedure specimen collection):    Answer:   Surgical Pathology    Order Specific Question:   Reason for Exam (SYMPTOM  OR DIAGNOSIS REQUIRED)    Answer:   mantle cell lymphoma    Order Specific Question:   Preferred imaging location?    Answer:   [facility]    Order Specific Question:   Radiology  Contrast Protocol - do NOT remove file path    Answer:   \\[email or web address].pdf  . CBC with Differential    Standing Status:   Future    Standing Expiration Date:   10/17/2018  . Hepatic function panel    Standing Status:   Future    Standing Expiration Date:   10/17/2018  . Beta 2 microglobulin, serum    Standing Status:   Future    Standing Expiration Date:   10/17/2018  . Uric acid    Standing Status:   Future    Standing Expiration Date:   10/17/2018  . Lactate dehydrogenase    Standing Status:   Future    Standing Expiration Date:   10/17/2018  . Hepatitis panel, acute    Standing Status:   Future    Standing Expiration Date:   10/17/2018  . ECHOCARDIOGRAM COMPLETE    Standing Status:   Future    Standing Expiration Date:   01/18/2019    Order Specific Question:   Where should this test be performed    Answer:   [facility]    Order Specific Question:   Perflutren DEFINITY (image enhancing agent) should be administered unless hypersensitivity or allergy exist    Answer:   Administer Perflutren    Order Specific Question:   Expected Date:    Answer:   ASAP    All questions were answered. The patient knows to call the clinic with any problems, questions or concerns. Total time spent is 60 minutes with more than 50% of the time spent face-to-face discussing new diagnosis, further work-up and coordination of care.     Derek Jack, MD 10/17/2017 12:37 PM

## 2017-10-17 NOTE — Assessment & Plan Note (Addendum)
1.  Mantle cell lymphoma involving GI tract: - Night sweats once every 2 to 3 weeks for the past 2 years, no recent weight loss, 88 pound weight loss over 6 months 2 years ago, right-sided abdominal pain for the last 4 to 6 months, with severe fatigue -EGD on 10/07/2017 showing normal esophagus, nodularity with ulceration at GE junction on gastric side in the cardia, biopsy of which consistent with mantle cell lymphoma, positive for CD20, CD79a, CD5 and cyclin D1. -Colonoscopy on 10/07/2017 with 5 mm polyp in transverse colon resected positive for lymphoma, ileocecal valve mucosal ulceration biopsied positive for lymphoma - I have discussed the pathology report with the patient and his wife in detail.  He will need further work-up in the form of whole body PET CT scan for accurate staging.  I will also order MRI of the brain with and without contrast as he is complaining of right-sided headaches and right eye blurring for the past few weeks.  We will also obtain a 2D echocardiogram to evaluate his baseline ejection fraction.  I will check his CBC with differential, LFTs, beta-2 microglobulin, uric acid, LDH and hepatitis panel.  We will also request sox 11 on pathology for determination of indolent versus aggressive disease.  We will arrange for a bone marrow aspiration and biopsy to rule out involvement.  He is already scheduled for a Port-A-Cath on 22nd of this month.  I will see him back after the above-mentioned work-up.

## 2017-10-18 ENCOUNTER — Ambulatory Visit (HOSPITAL_COMMUNITY)
Admission: RE | Admit: 2017-10-18 | Discharge: 2017-10-18 | Disposition: A | Discharge status: Home / Self Care | Payer: Medicare HMO | Source: Ambulatory Visit | Attending: Nurse Practitioner | Admitting: Nurse Practitioner

## 2017-10-18 DIAGNOSIS — E119 Type 2 diabetes mellitus without complications: Secondary | ICD-10-CM | POA: Insufficient documentation

## 2017-10-18 DIAGNOSIS — I2510 Atherosclerotic heart disease of native coronary artery without angina pectoris: Secondary | ICD-10-CM | POA: Insufficient documentation

## 2017-10-18 DIAGNOSIS — C8316 Mantle cell lymphoma, intrapelvic lymph nodes: Secondary | ICD-10-CM | POA: Diagnosis not present

## 2017-10-18 DIAGNOSIS — I10 Essential (primary) hypertension: Secondary | ICD-10-CM | POA: Insufficient documentation

## 2017-10-18 DIAGNOSIS — Z720 Tobacco use: Secondary | ICD-10-CM | POA: Diagnosis not present

## 2017-10-18 DIAGNOSIS — E785 Hyperlipidemia, unspecified: Secondary | ICD-10-CM | POA: Diagnosis not present

## 2017-10-18 LAB — HEPATITIS PANEL, ACUTE
HCV Ab: <0.1 s/co ratio (ref 0.0–0.9)
Hep A IgM: NEGATIVE
Hep B C IgM: NEGATIVE
Hepatitis B Surface Ag: NEGATIVE

## 2017-10-18 LAB — BETA 2 MICROGLOBULIN, SERUM: Beta-2 Microglobulin: 2.6 mg/L — ABNORMAL HIGH (ref 0.6–2.4)

## 2017-10-18 NOTE — Telephone Encounter (Signed)
Noted. Appears extensive oncology workup is ordered/scheduled. Will see patient at f/u visit in October in our office.

## 2017-10-18 NOTE — Progress Notes (Signed)
*  PRELIMINARY RESULTS* Echocardiogram 2D Echocardiogram has been performed.  Chris Lopez 10/18/2017, 9:31 AM

## 2017-10-22 ENCOUNTER — Other Ambulatory Visit: Payer: Self-pay | Admitting: Orthopaedic Surgery

## 2017-10-22 ENCOUNTER — Ambulatory Visit (HOSPITAL_COMMUNITY): Payer: Medicare HMO | Admitting: Hematology

## 2017-10-22 MED ORDER — OXYCODONE-ACETAMINOPHEN 7.5-325 MG PO TABS: 1.0000 | ORAL_TABLET | ORAL | 0 refills | Status: DC | PRN

## 2017-10-24 ENCOUNTER — Ambulatory Visit (HOSPITAL_COMMUNITY)
Admission: RE | Admit: 2017-10-24 | Discharge: 2017-10-24 | Disposition: A | Discharge status: Home / Self Care | Payer: Medicare HMO | Source: Ambulatory Visit | Attending: Nurse Practitioner | Admitting: Nurse Practitioner

## 2017-10-24 ENCOUNTER — Other Ambulatory Visit: Payer: Self-pay | Admitting: Radiology

## 2017-10-24 DIAGNOSIS — C8316 Mantle cell lymphoma, intrapelvic lymph nodes: Secondary | ICD-10-CM | POA: Insufficient documentation

## 2017-10-24 DIAGNOSIS — C8310 Mantle cell lymphoma, unspecified site: Secondary | ICD-10-CM | POA: Diagnosis not present

## 2017-10-24 MED ORDER — GADOBENATE DIMEGLUMINE 529 MG/ML IV SOLN
20.0000 mL | Freq: Once | INTRAVENOUS | Status: AC | PRN
  Administered 2017-10-24: 20 mL via INTRAVENOUS

## 2017-10-25 ENCOUNTER — Other Ambulatory Visit: Payer: Self-pay | Admitting: Radiology

## 2017-10-25 ENCOUNTER — Encounter (HOSPITAL_COMMUNITY): Payer: Self-pay

## 2017-10-25 ENCOUNTER — Ambulatory Visit (HOSPITAL_COMMUNITY)
Admission: RE | Admit: 2017-10-25 | Discharge: 2017-10-25 | Disposition: A | Discharge status: Home / Self Care | Payer: Medicare HMO | Source: Ambulatory Visit | Attending: Hematology | Admitting: Hematology

## 2017-10-25 ENCOUNTER — Other Ambulatory Visit: Payer: Self-pay

## 2017-10-25 ENCOUNTER — Ambulatory Visit (HOSPITAL_COMMUNITY)
Admission: RE | Admit: 2017-10-25 | Discharge: 2017-10-25 | Disposition: A | Discharge status: Home / Self Care | Payer: Medicare HMO | Source: Ambulatory Visit | Attending: Nurse Practitioner | Admitting: Nurse Practitioner

## 2017-10-25 DIAGNOSIS — N400 Enlarged prostate without lower urinary tract symptoms: Secondary | ICD-10-CM | POA: Insufficient documentation

## 2017-10-25 DIAGNOSIS — M1990 Unspecified osteoarthritis, unspecified site: Secondary | ICD-10-CM | POA: Diagnosis not present

## 2017-10-25 DIAGNOSIS — K219 Gastro-esophageal reflux disease without esophagitis: Secondary | ICD-10-CM | POA: Diagnosis not present

## 2017-10-25 DIAGNOSIS — C8319 Mantle cell lymphoma, extranodal and solid organ sites: Secondary | ICD-10-CM | POA: Diagnosis not present

## 2017-10-25 DIAGNOSIS — J449 Chronic obstructive pulmonary disease, unspecified: Secondary | ICD-10-CM | POA: Insufficient documentation

## 2017-10-25 DIAGNOSIS — Z8249 Family history of ischemic heart disease and other diseases of the circulatory system: Secondary | ICD-10-CM | POA: Diagnosis not present

## 2017-10-25 DIAGNOSIS — F419 Anxiety disorder, unspecified: Secondary | ICD-10-CM | POA: Insufficient documentation

## 2017-10-25 DIAGNOSIS — I10 Essential (primary) hypertension: Secondary | ICD-10-CM | POA: Diagnosis not present

## 2017-10-25 DIAGNOSIS — Z888 Allergy status to other drugs, medicaments and biological substances status: Secondary | ICD-10-CM | POA: Diagnosis not present

## 2017-10-25 DIAGNOSIS — C8318 Mantle cell lymphoma, lymph nodes of multiple sites: Secondary | ICD-10-CM | POA: Diagnosis not present

## 2017-10-25 DIAGNOSIS — Z96651 Presence of right artificial knee joint: Secondary | ICD-10-CM | POA: Insufficient documentation

## 2017-10-25 DIAGNOSIS — C8310 Mantle cell lymphoma, unspecified site: Secondary | ICD-10-CM | POA: Diagnosis not present

## 2017-10-25 DIAGNOSIS — F17210 Nicotine dependence, cigarettes, uncomplicated: Secondary | ICD-10-CM | POA: Insufficient documentation

## 2017-10-25 DIAGNOSIS — Z7982 Long term (current) use of aspirin: Secondary | ICD-10-CM | POA: Insufficient documentation

## 2017-10-25 DIAGNOSIS — Z79899 Other long term (current) drug therapy: Secondary | ICD-10-CM | POA: Diagnosis not present

## 2017-10-25 DIAGNOSIS — F329 Major depressive disorder, single episode, unspecified: Secondary | ICD-10-CM | POA: Insufficient documentation

## 2017-10-25 DIAGNOSIS — C8313 Mantle cell lymphoma, intra-abdominal lymph nodes: Secondary | ICD-10-CM | POA: Diagnosis not present

## 2017-10-25 DIAGNOSIS — D649 Anemia, unspecified: Secondary | ICD-10-CM | POA: Diagnosis not present

## 2017-10-25 DIAGNOSIS — Z91030 Bee allergy status: Secondary | ICD-10-CM | POA: Insufficient documentation

## 2017-10-25 DIAGNOSIS — E119 Type 2 diabetes mellitus without complications: Secondary | ICD-10-CM | POA: Insufficient documentation

## 2017-10-25 DIAGNOSIS — C8316 Mantle cell lymphoma, intrapelvic lymph nodes: Secondary | ICD-10-CM

## 2017-10-25 LAB — CBC WITH DIFFERENTIAL/PLATELET
Basophils Absolute: 0.1 10*3/uL (ref 0.0–0.1)
Basophils Relative: 1 %
EOS ABS: 0.4 10*3/uL (ref 0.0–0.7)
EOS PCT: 4 %
HCT: 38.6 % — ABNORMAL LOW (ref 39.0–52.0)
Hemoglobin: 12.8 g/dL — ABNORMAL LOW (ref 13.0–17.0)
Lymphocytes Relative: 26 %
Lymphs Abs: 2.3 10*3/uL (ref 0.7–4.0)
MCH: 28.8 pg (ref 26.0–34.0)
MCHC: 33.2 g/dL (ref 30.0–36.0)
MCV: 86.9 fL (ref 78.0–100.0)
MONO ABS: 0.6 10*3/uL (ref 0.1–1.0)
Monocytes Relative: 7 %
Neutro Abs: 5.3 10*3/uL (ref 1.7–7.7)
Neutrophils Relative %: 62 %
PLATELETS: 165 10*3/uL (ref 150–400)
RBC: 4.44 MIL/uL (ref 4.22–5.81)
RDW: 15.1 % (ref 11.5–15.5)
WBC: 8.6 10*3/uL (ref 4.0–10.5)

## 2017-10-25 LAB — PROTIME-INR
INR: 0.92
PROTHROMBIN TIME: 12.3 s (ref 11.4–15.2)

## 2017-10-25 MED ORDER — FLUMAZENIL 0.5 MG/5ML IV SOLN
INTRAVENOUS | Status: AC
  Filled 2017-10-25: qty 5

## 2017-10-25 MED ORDER — MIDAZOLAM HCL 2 MG/2ML IJ SOLN
INTRAMUSCULAR | Status: AC | PRN
  Administered 2017-10-25 (×2): 1 mg via INTRAVENOUS

## 2017-10-25 MED ORDER — SODIUM CHLORIDE 0.9 % IV SOLN
INTRAVENOUS | Status: DC
  Administered 2017-10-25: 08:00:00 via INTRAVENOUS

## 2017-10-25 MED ORDER — FENTANYL CITRATE (PF) 100 MCG/2ML IJ SOLN
INTRAMUSCULAR | Status: AC
  Filled 2017-10-25: qty 4

## 2017-10-25 MED ORDER — MIDAZOLAM HCL 2 MG/2ML IJ SOLN
INTRAMUSCULAR | Status: AC
  Filled 2017-10-25: qty 4

## 2017-10-25 MED ORDER — FENTANYL CITRATE (PF) 100 MCG/2ML IJ SOLN
INTRAMUSCULAR | Status: AC | PRN
  Administered 2017-10-25 (×2): 50 ug via INTRAVENOUS

## 2017-10-25 MED ORDER — NALOXONE HCL 0.4 MG/ML IJ SOLN
INTRAMUSCULAR | Status: AC
  Filled 2017-10-25: qty 1

## 2017-10-25 NOTE — Procedures (Signed)
Interventional Radiology Procedure Note  Procedure: CT guided aspirate and core biopsy of left post  iliac bone Complications: None Recommendations: - Bedrest supine x 1 hrs - OTC's PRN  Pain - Follow biopsy results  Signed,  Dulcy Fanny. Earleen Newport, DO

## 2017-10-25 NOTE — Discharge Instructions (Signed)

## 2017-10-25 NOTE — H&P (Signed)
Chief Complaint: Patient was seen in consultation today for mantle cell lymphoma  Referring Physician(s): Lockamy,Randi L  Supervising Physician: Corrie Mckusick  Patient Status: [facility] - Out-pt  History of Present Illness: Chris Lopez is a 72 y.o. male with past medical history of COPD, DM, GERD, HTN who has been having abdominal pain and nights sweats for several months.  He was evaluated by gastroenterology who found atypical lymphoid infilltrate during an EGD with biopsy. He was referred to Oncology for management of his mantle cell lymphoma. IR now consulted for bone marrow biopsy by Dr. Delton Coombes.  Patient has been NPO.  He does not take blood thinners.  He presents today in his usual state of health.   Past Medical History:  Diagnosis Date  . Anxiety   . Arthritis   . BPH (benign prostatic hyperplasia)   . Chronic bronchitis   . COPD (chronic obstructive pulmonary disease) ([place])   . Depression   . Diabetes mellitus without complication ([place])    Resolved; not needing medications at this time.  Marland Kitchen GERD (gastroesophageal reflux disease)   . Hypertension   . Lymphoma, mantle cell ([place])     Past Surgical History:  Procedure Laterality Date  . BIOPSY  10/07/2017   Procedure: BIOPSY;  Surgeon: Daneil Dolin, MD;  Location: [facility];  Service: Endoscopy;;  gastroesophageal junction bx ileocecal valve  . carpal tunnel right hand    . CERVICAL FUSION    . COLONOSCOPY WITH PROPOFOL N/A 10/07/2017   Procedure: COLONOSCOPY WITH PROPOFOL;  Surgeon: Daneil Dolin, MD;  Location: [facility];  Service: Endoscopy;  Laterality: N/A;  10:00am  . ESOPHAGOGASTRODUODENOSCOPY (EGD) WITH PROPOFOL N/A 10/07/2017   Procedure: ESOPHAGOGASTRODUODENOSCOPY (EGD) WITH PROPOFOL;  Surgeon: Daneil Dolin, MD;  Location: [facility];  Service: Endoscopy;  Laterality: N/A;  . left knee replacement    . left knee revised X 3    . multiple knee arthroscopies    . POLYPECTOMY  10/07/2017   Procedure: POLYPECTOMY;  Surgeon: Daneil Dolin, MD;  Location: [facility];  Service: Endoscopy;;  colon  . right knee replacement    . tarsal tunnel repair    . THYROIDECTOMY, PARTIAL    . TONSILLECTOMY      Allergies: Bee venom and Feldene [piroxicam]  Medications: Prior to Admission medications   Medication Sig Start Date End Date Taking? Authorizing Provider  albuterol (PROVENTIL HFA;VENTOLIN HFA) 108 (90 Base) MCG/ACT inhaler Inhale 2 puffs into the lungs every 6 (six) hours as needed for wheezing or shortness of breath.   Yes [provider]  amLODipine (NORVASC) 5 MG tablet Take 5 mg by mouth every evening. 07/01/17  Yes [provider]  aspirin 325 MG tablet Take 325 mg by mouth daily.    Yes [provider]  atorvastatin (LIPITOR) 20 MG tablet Take 20 mg by mouth every evening. Reported on 06/07/2015   Yes [provider]  diclofenac (VOLTAREN) 75 MG EC tablet TAKE 1 TABLET TWICE DAILY 02/18/17  Yes Sanjuana Kava, MD  gabapentin (NEURONTIN) 600 MG tablet Take 600 mg by mouth 3 (three) times daily.   Yes [provider]  LORazepam (ATIVAN) 2 MG tablet Take 2 mg by mouth at bedtime. 08/15/17  Yes [provider]  metoprolol (LOPRESSOR) 50 MG tablet Take 50 mg by mouth every evening.    Yes [provider]  omeprazole (PRILOSEC OTC) 20 MG tablet Take 20 mg by mouth 2 (two)  times daily. Reported on 06/07/2015   Yes [provider]  oxyCODONE-acetaminophen (PERCOCET) 7.5-325 MG tablet Take 1 tablet by mouth every 4 (four) hours as needed. 10/22/17  Yes Sanjuana Kava, MD  polyethylene glycol-electrolytes (TRILYTE) 420 g solution Take 4,000 mLs by mouth as directed. 07/29/17  Yes Rourk, [name], MD  Tamsulosin HCl (FLOMAX) 0.4 MG CAPS Take 0.4 mg by mouth daily. Reported on 06/07/2015   Yes [provider]  venlafaxine (EFFEXOR-XR) 150 MG 24 hr capsule Take 150 mg by mouth every evening.    Yes [provider]  nitroGLYCERIN (NITROSTAT) 0.4 MG SL tablet Place 0.4 mg under the tongue every 5 (five) minutes x 3 doses as needed. For chest pain    [provider]     Family History  Problem Relation Age of Onset  . Colon cancer Maternal Grandmother   . Colon cancer Paternal Grandmother   . Heart disease Mother   . Arthritis Mother   . Prostate cancer Father   . Heart attack Brother   . Heart disease Maternal Aunt   . Cancer Maternal Aunt   . Heart disease Maternal Uncle   . Cancer Maternal Uncle   . Heart disease Paternal Aunt   . Heart disease Paternal Uncle   . Heart disease Brother   . Heart attack Brother   . Arthritis Brother     Social History   Socioeconomic History  . Marital status: Married    Spouse name: Jonelle Sidle  . Number of children: 4  . Years of education: Not on file  . Highest education level: Bachelor's degree (e.g., BA, AB, BS)  Occupational History  . 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 Financial resource strain: Somewhat hard  . Food insecurity:    Worry: Sometimes true    Inability: Never true  . Transportation needs:    Medical: No    Non-medical: No  Tobacco Use  . Smoking status: Current Every Day Smoker    Packs/day: 1.25    Years: 50.00    Pack years: 62.50    Types: Cigarettes  . Smokeless tobacco: Never Used  Substance and Sexual Activity  . Alcohol use: No  . Drug use: No  . Sexual activity: Yes    Partners: Female    Birth control/protection: None    Comment: spouse  Lifestyle  . Physical activity:    Days per week: 3 days    Minutes per session: 150+ min  . Stress: Very much  Relationships  . Social connections:    Talks on phone: Once a week    Gets together: Never    Attends religious service: Never    Active member of club or organization: No    Attends  meetings of clubs or organizations: Never    Relationship status: Married  Other Topics Concern  . Not on file  Social History Narrative  . Not on file     Review of Systems: A 12 point ROS discussed and pertinent positives are indicated in the HPI above.  All other systems are negative.  Review of Systems  Constitutional: Positive for fatigue. Negative for fever.  Respiratory: Negative for cough and shortness of breath.   Cardiovascular: Negative for chest pain.  Gastrointestinal: Positive for abdominal pain (for several months).  Genitourinary: Negative for dysuria.  Musculoskeletal: Negative for back pain.  Psychiatric/Behavioral: Negative for behavioral problems and confusion.    Vital Signs: BP 128/79 (BP Location: Left Arm)   Pulse 60   Temp 97.6 F (36.4 C) (Oral)   Resp 18   Ht 6' (1.829 m)   Wt 212 lb (96.2 kg)   SpO2 93%   BMI 28.75 kg/m   Physical Exam  Constitutional: He is oriented to person, place, and time. He appears well-developed. No distress.  Neck: Normal range of motion. Neck supple. No tracheal deviation present.  Cardiovascular: Normal rate, regular rhythm and normal heart sounds. Exam reveals no gallop and no friction rub.  No murmur heard. Pulmonary/Chest: Effort normal and breath sounds normal. No respiratory distress.  Abdominal: Soft. He exhibits no distension.  Neurological: He is alert and oriented to person, place, and time.  Skin: Skin is warm and dry. He is not diaphoretic.  Psychiatric: He has a normal mood and affect. His behavior is normal. Judgment and thought content normal.  Nursing note and vitals reviewed.        Imaging: Mr Jeri Cos XO Contrast  Result Date: 10/24/2017 CLINICAL DATA:  72 year old male with mantle cell lymphoma. Right side blurred vision and headaches. Difficulty walking. Staging. EXAM: MRI HEAD WITHOUT AND WITH CONTRAST TECHNIQUE: Multiplanar, multiecho pulse sequences of the brain and surrounding  structures were obtained without and with intravenous contrast. CONTRAST:  16m MULTIHANCE GADOBENATE DIMEGLUMINE 529 MG/ML IV SOLN COMPARISON:  Cervical spine MRI 05/19/2009. FINDINGS: Brain: No restricted diffusion to suggest acute infarction. No midline shift, mass effect, evidence of mass lesion, ventriculomegaly, extra-axial collection or acute intracranial hemorrhage. Cervicomedullary junction and pituitary are within normal limits. No abnormal enhancement identified. No dural thickening. Mild for age scattered nonspecific nonenhancing cerebral white matter T2 and FLAIR hyperintensity. No cortical encephalomalacia or chronic cerebral blood products. Normal deep gray matter nuclei, brainstem, and cerebellum. Vascular: Major intracranial vascular flow voids are preserved. The distal left vertebral artery appears dominant. Skull and upper cervical spine: Negative visible upper cervical spine. Nonspecific decreased T1 calvarium bone marrow signal, but no destructive or suspicious osseous lesion identified. Sinuses/Orbits: There are bilateral intra or enhancing soft tissue masses. The larger on the right encompasses 30 x 12 x 19 millimeters (AP by transverse by CC). A smaller lesion on the left is 12 x 10 x 9 millimeters. Both demonstrate abnormal diffusion in keeping with hyper cellularity. The globes and optic nerves appear to be spared. The paranasal sinuses are clear. Other: Mastoid air cells are clear. Visible internal auditory structures appear normal. Evidence of abnormal hypercellular soft tissue nodules in the left greater than right face including the left parotid space (series 6, image 53), and upper neck. IMPRESSION: 1. Positive for bilateral hypercellular intraorbital masses highly suspicious for Orbital Lymphoma in this clinical setting. The larger mass in the right orbit measures 3 centimeters. 2. Evidence of parotid space/upper neck lymphoma related Lymphadenopathy which is incompletely visible. 3.  No cerebral lymphoma identified. Largely unremarkable for age MRI appearance of the brain. Electronically Signed   By: HGenevie AnnM.D.   On: 10/24/2017 14:53    Labs:  CBC: Recent Labs    10/02/17 0824 10/17/17 1304 10/25/17 0730  WBC 8.4 9.4 8.6  HGB 12.7* 13.0 12.8*  HCT 38.9* 39.1 38.6*  PLT 158 153 165    COAGS: Recent Labs    10/25/17 0730  INR 0.92    BMP: Recent Labs  05/14/17 1429 10/02/17 0824  NA  --  138  K  --  4.6  CL  --  103  CO2  --  28  GLUCOSE  --  100*  BUN  --  12  CALCIUM  --  8.2*  CREATININE 1.00 1.11  GFRNONAA  --  >60  GFRAA  --  >60    LIVER FUNCTION TESTS: Recent Labs    10/17/17 1304  BILITOT 0.8  AST 15  ALT 13  ALKPHOS 92  PROT 6.7  ALBUMIN 3.6    TUMOR MARKERS: No results for input(s): AFPTM, CEA, CA199, CHROMGRNA in the last 8760 hours.  Assessment and Plan: Patient with past medical history of abdominal pain presents with newly diagnosed mantle cell lymphoma.  IR consulted for bone marrow biopsy at the request of Dr. Delton Coombes. Case reviewed by Dr. Earleen Newport who approves patient for procedure.  Patient presents today in their usual state of health.  He has been NPO and is not currently on blood thinners.    Risks and benefits discussed with the patient including, but not limited to bleeding, infection, damage to adjacent structures or low yield requiring additional tests.  All of the patient's questions were answered, patient is agreeable to proceed. Consent signed and in chart.   Thank you for this interesting consult.  I greatly enjoyed meeting Chris Lopez and look forward to participating in their care.  A copy of this report was sent to the requesting provider on this date.  Electronically Signed: Docia Barrier, PA 10/25/2017, 8:19 AM   I spent a total of  30 Minutes   in face to face in clinical consultation, greater than 50% of which was counseling/coordinating care for mantle cell lymphoma.

## 2017-10-28 ENCOUNTER — Ambulatory Visit (HOSPITAL_COMMUNITY)
Admission: RE | Admit: 2017-10-28 | Discharge: 2017-10-28 | Disposition: A | Discharge status: Home / Self Care | Payer: Medicare HMO | Source: Ambulatory Visit | Attending: Nurse Practitioner | Admitting: Nurse Practitioner

## 2017-10-28 ENCOUNTER — Ambulatory Visit (HOSPITAL_COMMUNITY): Admission: RE | Admit: 2017-10-28 | Payer: Medicare HMO | Source: Ambulatory Visit

## 2017-10-28 DIAGNOSIS — C8313 Mantle cell lymphoma, intra-abdominal lymph nodes: Secondary | ICD-10-CM | POA: Insufficient documentation

## 2017-10-28 DIAGNOSIS — C8316 Mantle cell lymphoma, intrapelvic lymph nodes: Secondary | ICD-10-CM

## 2017-10-28 LAB — GLUCOSE, CAPILLARY: GLUCOSE-CAPILLARY: 96 mg/dL (ref 70–99)

## 2017-10-28 MED ORDER — FLUDEOXYGLUCOSE F - 18 (FDG) INJECTION
10.5700 | Freq: Once | INTRAVENOUS | Status: AC | PRN
  Administered 2017-10-28: 10.57 via INTRAVENOUS

## 2017-10-29 ENCOUNTER — Other Ambulatory Visit: Payer: Self-pay | Admitting: Student

## 2017-10-30 ENCOUNTER — Ambulatory Visit (HOSPITAL_COMMUNITY): Payer: Medicare HMO | Admitting: Hematology

## 2017-10-30 ENCOUNTER — Encounter (HOSPITAL_COMMUNITY): Payer: Self-pay

## 2017-10-30 ENCOUNTER — Ambulatory Visit (HOSPITAL_COMMUNITY)
Admission: RE | Admit: 2017-10-30 | Discharge: 2017-10-30 | Disposition: A | Discharge status: Home / Self Care | Payer: Medicare HMO | Source: Ambulatory Visit | Attending: Hematology | Admitting: Hematology

## 2017-10-30 ENCOUNTER — Ambulatory Visit (HOSPITAL_COMMUNITY)
Admission: RE | Admit: 2017-10-30 | Discharge: 2017-10-30 | Disposition: A | Discharge status: Home / Self Care | Payer: Medicare HMO | Source: Ambulatory Visit | Attending: Nurse Practitioner | Admitting: Nurse Practitioner

## 2017-10-30 DIAGNOSIS — F419 Anxiety disorder, unspecified: Secondary | ICD-10-CM | POA: Diagnosis not present

## 2017-10-30 DIAGNOSIS — Z452 Encounter for adjustment and management of vascular access device: Secondary | ICD-10-CM | POA: Diagnosis not present

## 2017-10-30 DIAGNOSIS — Z8249 Family history of ischemic heart disease and other diseases of the circulatory system: Secondary | ICD-10-CM | POA: Diagnosis not present

## 2017-10-30 DIAGNOSIS — F17210 Nicotine dependence, cigarettes, uncomplicated: Secondary | ICD-10-CM | POA: Insufficient documentation

## 2017-10-30 DIAGNOSIS — C8310 Mantle cell lymphoma, unspecified site: Secondary | ICD-10-CM | POA: Insufficient documentation

## 2017-10-30 DIAGNOSIS — I10 Essential (primary) hypertension: Secondary | ICD-10-CM | POA: Insufficient documentation

## 2017-10-30 DIAGNOSIS — K219 Gastro-esophageal reflux disease without esophagitis: Secondary | ICD-10-CM | POA: Diagnosis not present

## 2017-10-30 DIAGNOSIS — E119 Type 2 diabetes mellitus without complications: Secondary | ICD-10-CM | POA: Insufficient documentation

## 2017-10-30 DIAGNOSIS — F329 Major depressive disorder, single episode, unspecified: Secondary | ICD-10-CM | POA: Insufficient documentation

## 2017-10-30 DIAGNOSIS — Z7982 Long term (current) use of aspirin: Secondary | ICD-10-CM | POA: Insufficient documentation

## 2017-10-30 DIAGNOSIS — N400 Enlarged prostate without lower urinary tract symptoms: Secondary | ICD-10-CM | POA: Insufficient documentation

## 2017-10-30 DIAGNOSIS — Z5111 Encounter for antineoplastic chemotherapy: Secondary | ICD-10-CM | POA: Diagnosis not present

## 2017-10-30 DIAGNOSIS — M1990 Unspecified osteoarthritis, unspecified site: Secondary | ICD-10-CM | POA: Insufficient documentation

## 2017-10-30 DIAGNOSIS — J449 Chronic obstructive pulmonary disease, unspecified: Secondary | ICD-10-CM | POA: Insufficient documentation

## 2017-10-30 HISTORY — PX: IR IMAGING GUIDED PORT INSERTION: IMG5740

## 2017-10-30 LAB — CBC
HCT: 41.6 % (ref 39.0–52.0)
HEMOGLOBIN: 13.9 g/dL (ref 13.0–17.0)
MCH: 29 pg (ref 26.0–34.0)
MCHC: 33.4 g/dL (ref 30.0–36.0)
MCV: 86.7 fL (ref 78.0–100.0)
PLATELETS: 166 10*3/uL (ref 150–400)
RBC: 4.8 MIL/uL (ref 4.22–5.81)
RDW: 14.8 % (ref 11.5–15.5)
WBC: 8.2 10*3/uL (ref 4.0–10.5)

## 2017-10-30 LAB — BASIC METABOLIC PANEL
ANION GAP: 8 (ref 5–15)
BUN: 12 mg/dL (ref 8–23)
CALCIUM: 9.2 mg/dL (ref 8.9–10.3)
CHLORIDE: 107 mmol/L (ref 98–111)
CO2: 28 mmol/L (ref 22–32)
CREATININE: 1.3 mg/dL — AB (ref 0.61–1.24)
GFR calc non Af Amer: 54 mL/min — ABNORMAL LOW (ref >60)
Glucose, Bld: 98 mg/dL (ref 70–99)
Potassium: 4.5 mmol/L (ref 3.5–5.1)
SODIUM: 143 mmol/L (ref 135–145)

## 2017-10-30 LAB — PROTIME-INR
INR: 0.97
PROTHROMBIN TIME: 12.8 s (ref 11.4–15.2)

## 2017-10-30 MED ORDER — FENTANYL CITRATE (PF) 100 MCG/2ML IJ SOLN
INTRAMUSCULAR | Status: AC | PRN
  Administered 2017-10-30 (×2): 50 ug via INTRAVENOUS

## 2017-10-30 MED ORDER — MIDAZOLAM HCL 2 MG/2ML IJ SOLN
INTRAMUSCULAR | Status: AC
  Filled 2017-10-30: qty 4

## 2017-10-30 MED ORDER — LIDOCAINE HCL 1 % IJ SOLN
INTRAMUSCULAR | Status: AC
  Filled 2017-10-30: qty 20

## 2017-10-30 MED ORDER — CEFAZOLIN SODIUM-DEXTROSE 2-4 GM/100ML-% IV SOLN
INTRAVENOUS | Status: AC
  Filled 2017-10-30: qty 100

## 2017-10-30 MED ORDER — SODIUM CHLORIDE 0.9 % IV SOLN
INTRAVENOUS | Status: DC
  Administered 2017-10-30: 08:00:00 via INTRAVENOUS

## 2017-10-30 MED ORDER — FLUMAZENIL 0.5 MG/5ML IV SOLN
INTRAVENOUS | Status: AC
  Filled 2017-10-30: qty 5

## 2017-10-30 MED ORDER — FENTANYL CITRATE (PF) 100 MCG/2ML IJ SOLN
INTRAMUSCULAR | Status: AC
  Filled 2017-10-30: qty 2

## 2017-10-30 MED ORDER — HEPARIN SOD (PORK) LOCK FLUSH 100 UNIT/ML IV SOLN
INTRAVENOUS | Status: AC | PRN
  Administered 2017-10-30: 500 [IU] via INTRAVENOUS

## 2017-10-30 MED ORDER — MIDAZOLAM HCL 2 MG/2ML IJ SOLN
INTRAMUSCULAR | Status: AC | PRN
  Administered 2017-10-30: 2 mg via INTRAVENOUS

## 2017-10-30 MED ORDER — NALOXONE HCL 0.4 MG/ML IJ SOLN
INTRAMUSCULAR | Status: AC
  Filled 2017-10-30: qty 1

## 2017-10-30 MED ORDER — CEFAZOLIN SODIUM-DEXTROSE 2-4 GM/100ML-% IV SOLN
2.0000 g | INTRAVENOUS | Status: AC
  Administered 2017-10-30: 2 g via INTRAVENOUS

## 2017-10-30 MED ORDER — HEPARIN SOD (PORK) LOCK FLUSH 100 UNIT/ML IV SOLN
INTRAVENOUS | Status: AC
  Filled 2017-10-30: qty 5

## 2017-10-30 NOTE — Discharge Instructions (Addendum)
portImplanted Port Insertion, Care After This sheet gives you information about how to care for yourself after your procedure. Your health care provider may also give you more specific instructions. If you have problems or questions, contact your health care provider. What can I expect after the procedure? After your procedure, it is common to have:  Discomfort at the port insertion site.  Bruising on the skin over the port. This should improve over 3-4 days.  Follow these instructions at home: [facility] care  After your port is placed, you will get a manufacturer's information card. The card has information about your port. Keep this card with you at all times.  Take care of the port as told by your health care provider. Ask your health care provider if you or a family member can get training for taking care of the port at home. A home health care nurse may also take care of the port.  Make sure to remember what type of port you have. Incision care  Follow instructions from your health care provider about how to take care of your port insertion site. Make sure you: ? Wash your hands with soap and water before you change your bandage (dressing). If soap and water are not available, use hand sanitizer. ? Change your dressing as told by your health care provider. ? Leave stitches (sutures), skin glue, or adhesive strips in place. These skin closures may need to stay in place for 2 weeks or longer. If adhesive strip edges start to loosen and curl up, you may trim the loose edges. Do not remove adhesive strips completely unless your health care provider tells you to do that.  Check your port insertion site every day for signs of infection. Check for: ? More redness, swelling, or pain. ? More fluid or blood. ? Warmth. ? Pus or a bad smell. General instructions  Do not take baths, swim, or use a hot tub until your health care provider approves.  Do not lift anything that is heavier than 10 lb  (4.5 kg) for a week, or as told by your health care provider.  Ask your health care provider when it is okay to: ? Return to work or school. ? Resume usual physical activities or sports.  Do not drive for 24 hours if you were given a medicine to help you relax (sedative).  Take over-the-counter and prescription medicines only as told by your health care provider.  Wear a medical alert bracelet in case of an emergency. This will tell any health care providers that you have a port.  Keep all follow-up visits as told by your health care provider. This is important. Contact a health care provider if:  You cannot flush your port with saline as directed, or you cannot draw blood from the port.  You have a fever or chills.  You have more redness, swelling, or pain around your port insertion site.  You have more fluid or blood coming from your port insertion site.  Your port insertion site feels warm to the touch.  You have pus or a bad smell coming from the port insertion site. Get help right away if:  You have chest pain or shortness of breath.  You have bleeding from your port that you cannot control. Summary  Take care of the port as told by your health care provider.  Change your dressing as told by your health care provider.  Keep all follow-up visits as told by your health care provider.  This information is not intended to replace advice given to you by your health care provider. Make sure you discuss any questions you have with your health care provider. Document Released: 01/14/2013 Document Revised: 02/15/2016 Document Reviewed: 02/15/2016 Elsevier Interactive Patient Education  2017 [place] An implanted port is a type of central line that is placed under the skin. Central lines are used to provide IV access when treatment or nutrition needs to be given through a persons veins. Implanted ports are used for long-term IV access. An implanted  port may be placed because:  You need IV medicine that would be irritating to the small veins in your hands or arms.  You need long-term IV medicines, such as antibiotics.  You need IV nutrition for a long period.  You need frequent blood draws for lab tests.  You need dialysis.  Implanted ports are usually placed in the chest area, but they can also be placed in the upper arm, the abdomen, or the leg. An implanted port has two main parts:  Reservoir. The reservoir is round and will appear as a small, raised area under your skin. The reservoir is the part where a needle is inserted to give medicines or draw blood.  Catheter. The catheter is a thin, flexible tube that extends from the reservoir. The catheter is placed into a large vein. Medicine that is inserted into the reservoir goes into the catheter and then into the vein.  How will I care for my incision site? Do not get the incision site wet. Bathe or shower as directed by your health care provider. How is my port accessed? Special steps must be taken to access the port:  Before the port is accessed, a numbing cream can be placed on the skin. This helps numb the skin over the port site.  Your health care provider uses a sterile technique to access the port. ? Your health care provider must put on a mask and sterile gloves. ? The skin over your port is cleaned carefully with an antiseptic and allowed to dry. ? The port is gently pinched between sterile gloves, and a needle is inserted into the port.  Only "non-coring" port needles should be used to access the port. Once the port is accessed, a blood return should be checked. This helps ensure that the port is in the vein and is not clogged.  If your port needs to remain accessed for a constant infusion, a clear (transparent) bandage will be placed over the needle site. The bandage and needle will need to be changed every week, or as directed by your health care provider.  Keep  the bandage covering the needle clean and dry. Do not get it wet. Follow your health care providers instructions on how to take a shower or bath while the port is accessed.  If your port does not need to stay accessed, no bandage is needed over the port.  What is flushing? Flushing helps keep the port from getting clogged. Follow your health care providers instructions on how and when to flush the port. Ports are usually flushed with saline solution or a medicine called heparin. The need for flushing will depend on how the port is used.  If the port is used for intermittent medicines or blood draws, the port will need to be flushed: ? After medicines have been given. ? After blood has been drawn. ? As part of routine maintenance.  If a constant infusion is  running, the port may not need to be flushed.  How long will my port stay implanted? The port can stay in for as long as your health care provider thinks it is needed. When it is time for the port to come out, surgery will be done to remove it. The procedure is similar to the one performed when the port was put in. When should I seek immediate medical care? When you have an implanted port, you should seek immediate medical care if:  You notice a bad smell coming from the incision site.  You have swelling, redness, or drainage at the incision site.  You have more swelling or pain at the port site or the surrounding area.  You have a fever that is not controlled with medicine.  This information is not intended to replace advice given to you by your health care provider. Make sure you discuss any questions you have with your health care provider. Document Released: 03/26/2005 Document Revised: 09/01/2015 Document Reviewed: 12/01/2012 Elsevier Interactive Patient Education  2017 [place]. Moderate Conscious Sedation, Adult, Care After These instructions provide you with information about caring for yourself after your procedure.  Your health care provider may also give you more specific instructions. Your treatment has been planned according to current medical practices, but problems sometimes occur. Call your health care provider if you have any problems or questions after your procedure. What can I expect after the procedure? After your procedure, it is common:  To feel sleepy for several hours.  To feel clumsy and have poor balance for several hours.  To have poor judgment for several hours.  To vomit if you eat too soon.  Follow these instructions at home: For at least 24 hours after the procedure:   Do not: ? Participate in activities where you could fall or become injured. ? Drive. ? Use heavy machinery. ? Drink alcohol. ? Take sleeping pills or medicines that cause drowsiness. ? Make important decisions or sign legal documents. ? Take care of children on your own.  Rest. Eating and drinking  Follow the diet recommended by your health care provider.  If you vomit: ? Drink water, juice, or soup when you can drink without vomiting. ? Make sure you have little or no nausea before eating solid foods. General instructions  Have a responsible adult stay with you until you are awake and alert.  Take over-the-counter and prescription medicines only as told by your health care provider.  If you smoke, do not smoke without supervision.  Keep all follow-up visits as told by your health care provider. This is important. Contact a health care provider if:  You keep feeling nauseous or you keep vomiting.  You feel light-headed.  You develop a rash.  You have a fever. Get help right away if:  You have trouble breathing. This information is not intended to replace advice given to you by your health care provider. Make sure you discuss any questions you have with your health care provider. Document Released: 01/14/2013 Document Revised: 08/29/2015 Document Reviewed: 07/16/2015 Elsevier Interactive  Patient Education  [employer].

## 2017-10-30 NOTE — Procedures (Signed)
Interventional Radiology Procedure Note  Procedure: Single Lumen Power Port Placement    Access:  Right IJ vein.  Findings: Catheter tip positioned at SVC/RA junction. Port is ready for immediate use.   Complications: None  EBL: < 10 mL  Recommendations:  - Ok to shower in 24 hours - Do not submerge for 7 days - Routine line care   Ramon Zanders T. Kadden Osterhout, M.D Pager:  [phone]   

## 2017-10-30 NOTE — H&P (Signed)
Chief Complaint: Lymphoma  Referring Physician(s): Glennie Isle  Supervising Physician: Aletta Edouard  Patient Status: [facility] - Out-pt  History of Present Illness: Chris Lopez is a 72 y.o. male who is known to our service.  He underwent a bone marrow biopsy by Dr. Earleen Newport on 10/25/2017.  Pathology revealed mantle cell lymphoma.  He is here today for placement of a Port A Cath.  He is NPO. No blood thinners.  He feels well today. No fever/chills, no n/v. Only c/o chronic cough. ROS otherwise negative.  Past Medical History:  Diagnosis Date  . Anxiety   . Arthritis   . BPH (benign prostatic hyperplasia)   . Chronic bronchitis   . COPD (chronic obstructive pulmonary disease) ([place])   . Depression   . Diabetes mellitus without complication ([place])    Resolved; not needing medications at this time.  Marland Kitchen GERD (gastroesophageal reflux disease)   . Hypertension   . Lymphoma, mantle cell ([place])     Past Surgical History:  Procedure Laterality Date  . BIOPSY  10/07/2017   Procedure: BIOPSY;  Surgeon: Daneil Dolin, MD;  Location: [facility] ENDO SUITE;  Service: Endoscopy;;  gastroesophageal junction bx ileocecal valve  . carpal tunnel right hand    . CERVICAL FUSION    . COLONOSCOPY WITH PROPOFOL N/A 10/07/2017   Procedure: COLONOSCOPY WITH PROPOFOL;  Surgeon: Daneil Dolin, MD;  Location: [facility];  Service: Endoscopy;  Laterality: N/A;  10:00am  . ESOPHAGOGASTRODUODENOSCOPY (EGD) WITH PROPOFOL N/A 10/07/2017   Procedure: ESOPHAGOGASTRODUODENOSCOPY (EGD) WITH PROPOFOL;  Surgeon: Daneil Dolin, MD;  Location: [facility];  Service: Endoscopy;  Laterality: N/A;  . left knee replacement    . left knee revised X 3    . multiple knee arthroscopies    . POLYPECTOMY  10/07/2017   Procedure: POLYPECTOMY;  Surgeon: Daneil Dolin, MD;  Location: [facility];  Service: Endoscopy;;  colon  . right knee replacement    . tarsal tunnel repair    . THYROIDECTOMY, PARTIAL    .  TONSILLECTOMY      Allergies: Bee venom and Feldene [piroxicam]  Medications: Prior to Admission medications   Medication Sig Start Date End Date Taking? Authorizing Provider  albuterol (PROVENTIL HFA;VENTOLIN HFA) 108 (90 Base) MCG/ACT inhaler Inhale 2 puffs into the lungs every 6 (six) hours as needed for wheezing or shortness of breath.   Yes [provider]  amLODipine (NORVASC) 5 MG tablet Take 5 mg by mouth every evening. 07/01/17  Yes [provider]  aspirin 325 MG tablet Take 325 mg by mouth daily.    Yes [provider]  atorvastatin (LIPITOR) 20 MG tablet Take 20 mg by mouth every evening. Reported on 06/07/2015   Yes [provider]  diclofenac (VOLTAREN) 75 MG EC tablet TAKE 1 TABLET TWICE DAILY 02/18/17  Yes Sanjuana Kava, MD  gabapentin (NEURONTIN) 600 MG tablet Take 600 mg by mouth 3 (three) times daily.   Yes [provider]  LORazepam (ATIVAN) 2 MG tablet Take 2 mg by mouth at bedtime. 08/15/17  Yes [provider]  metoprolol (LOPRESSOR) 50 MG tablet Take 50 mg by mouth every evening.    Yes [provider]  omeprazole (PRILOSEC OTC) 20 MG tablet Take 20 mg by mouth 2 (two) times daily. Reported on 06/07/2015   Yes [provider]  oxyCODONE-acetaminophen (PERCOCET) 7.5-325 MG tablet Take 1 tablet by mouth every 4 (four) hours as needed. 10/22/17  Yes Sanjuana Kava, MD  Tamsulosin HCl (FLOMAX) 0.4 MG CAPS Take 0.4 mg by mouth daily. Reported on 06/07/2015   Yes [provider]  venlafaxine (EFFEXOR-XR) 150 MG 24 hr capsule Take 150 mg by mouth every evening.    Yes [provider]  nitroGLYCERIN (NITROSTAT) 0.4 MG SL tablet Place 0.4 mg under the tongue every 5 (five) minutes x 3 doses as needed. For chest pain    [provider]  polyethylene glycol-electrolytes (TRILYTE) 420 g solution Take 4,000 mLs by mouth as directed. 07/29/17   Rourk, [name], MD     Family History    Problem Relation Age of Onset  . Colon cancer Maternal Grandmother   . Colon cancer Paternal Grandmother   . Heart disease Mother   . Arthritis Mother   . Prostate cancer Father   . Heart attack Brother   . Heart disease Maternal Aunt   . Cancer Maternal Aunt   . Heart disease Maternal Uncle   . Cancer Maternal Uncle   . Heart disease Paternal Aunt   . Heart disease Paternal Uncle   . Heart disease Brother   . Heart attack Brother   . Arthritis Brother     Social History   Socioeconomic History  . Marital status: Married    Spouse name: Jonelle Sidle  . Number of children: 4  . Years of education: Not on file  . Highest education level: Bachelor's degree (e.g., BA, AB, BS)  Occupational History  . 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 Financial resource strain: Somewhat hard  . Food insecurity:    Worry: Sometimes true    Inability: Never true  . Transportation needs:    Medical: No    Non-medical: No  Tobacco Use  . Smoking status: Current Every Day Smoker    Packs/day: 1.25    Years: 50.00    Pack years: 62.50    Types: Cigarettes  . Smokeless tobacco: Never Used  Substance and Sexual Activity  . Alcohol use: No  . Drug use: No  . Sexual activity: Yes    Partners: Female    Birth control/protection: None    Comment: spouse  Lifestyle  . Physical activity:    Days per week: 3 days    Minutes per session: 150+ min  . Stress: Very much  Relationships  . Social connections:    Talks on phone: Once a week    Gets together: Never    Attends religious service: Never    Active member of club or organization: No    Attends meetings of clubs or organizations: Never    Relationship status: Married  Other Topics Concern  . Not on file  Social History Narrative  . Not on file     Review of Systems: A 12 point ROS  discussed and pertinent positives are indicated in the HPI above.  All other systems are negative. Review of Systems  Vital Signs: BP 139/84   Pulse (!) 53   Temp (!) 97.5 F (36.4 C) (Oral)   Resp 14   SpO2 96%   Physical Exam  Constitutional: He is oriented to person, place, and time. He appears well-developed.  HENT:  Head: Normocephalic.  Cardiovascular: Normal rate, regular rhythm and normal heart sounds.  Pulmonary/Chest: Effort normal.  No respiratory distress.  + Rhonchi bilaterally  Abdominal: Soft. There is no tenderness.  Musculoskeletal: Normal range of motion.  Neurological: He is alert and oriented to person, place, and time.  Skin: Skin is warm and dry.  Psychiatric: He has a normal mood and affect. His behavior is normal. Judgment and thought content normal.  Vitals reviewed.   Imaging: Mr Jeri Cos JO Contrast  Result Date: 10/24/2017 CLINICAL DATA:  72 year old male with mantle cell lymphoma. Right side blurred vision and headaches. Difficulty walking. Staging. EXAM: MRI HEAD WITHOUT AND WITH CONTRAST TECHNIQUE: Multiplanar, multiecho pulse sequences of the brain and surrounding structures were obtained without and with intravenous contrast. CONTRAST:  47m MULTIHANCE GADOBENATE DIMEGLUMINE 529 MG/ML IV SOLN COMPARISON:  Cervical spine MRI 05/19/2009. FINDINGS: Brain: No restricted diffusion to suggest acute infarction. No midline shift, mass effect, evidence of mass lesion, ventriculomegaly, extra-axial collection or acute intracranial hemorrhage. Cervicomedullary junction and pituitary are within normal limits. No abnormal enhancement identified. No dural thickening. Mild for age scattered nonspecific nonenhancing cerebral white matter T2 and FLAIR hyperintensity. No cortical encephalomalacia or chronic cerebral blood products. Normal deep gray matter nuclei, brainstem, and cerebellum. Vascular: Major intracranial vascular flow voids are preserved. The distal left  vertebral artery appears dominant. Skull and upper cervical spine: Negative visible upper cervical spine. Nonspecific decreased T1 calvarium bone marrow signal, but no destructive or suspicious osseous lesion identified. Sinuses/Orbits: There are bilateral intra or enhancing soft tissue masses. The larger on the right encompasses 30 x 12 x 19 millimeters (AP by transverse by CC). A smaller lesion on the left is 12 x 10 x 9 millimeters. Both demonstrate abnormal diffusion in keeping with hyper cellularity. The globes and optic nerves appear to be spared. The paranasal sinuses are clear. Other: Mastoid air cells are clear. Visible internal auditory structures appear normal. Evidence of abnormal hypercellular soft tissue nodules in the left greater than right face including the left parotid space (series 6, image 53), and upper neck. IMPRESSION: 1. Positive for bilateral hypercellular intraorbital masses highly suspicious for Orbital Lymphoma in this clinical setting. The larger mass in the right orbit measures 3 centimeters. 2. Evidence of parotid space/upper neck lymphoma related Lymphadenopathy which is incompletely visible. 3. No cerebral lymphoma identified. Largely unremarkable for age MRI appearance of the brain. Electronically Signed   By: HGenevie AnnM.D.   On: 10/24/2017 14:53   Nm Pet Image Initial (pi) Skull Base To Thigh  Result Date: 10/28/2017 CLINICAL DATA:  Initial treatment strategy for mantle cell lymphoma of the GI tract. EXAM: NUCLEAR MEDICINE PET SKULL BASE TO THIGH TECHNIQUE: 10.57 mCi F-18 FDG was injected intravenously. Full-ring PET imaging was performed from the skull base to thigh after the radiotracer. CT data was obtained and used for attenuation correction and anatomic localization. Fasting blood glucose: 96 mg/dl COMPARISON:  CT scan 05/14/2017 FINDINGS: Mediastinal blood pool activity: SUV max 2.30 NECK: Hypermetabolic neck nodes are noted. There are periparotid nodes bilaterally with  index node on the right side measuring 12.5 mm on image number 28. SUV max is 10.0. 8 mm node on image number 26 on the left side has an SUV max of 7.47. Incidental CT findings: none CHEST: Small bilateral axillary lymph nodes are hypermetabolic. 11 mm node on the left on image number 58 has an SUV max of 4.71. 11 mm node on image number 63 on the right side has an SUV max of 7.67. Small left subpectoral lymph node on the left on  image number 57 measures 6 mm and SUV max is 3.34. Numerous mediastinal and hilar lymph nodes are mildly hypermetabolic and consistent with lymphoma. 12.5 mm precarinal lymph node on image 77 has an SUV max of 5.09. Bilateral small hilar lymph nodes have an SUV max of 4.8. No significant pulmonary findings. Incidental CT findings: Emphysematous changes. Advanced vascular calcifications including three-vessel coronary artery calcifications. Calcified subcarinal lymph nodes and a few small calcified granulomas in left lower lobe. ABDOMEN/PELVIS: No definite involvement of the liver or spleen is identified. The lesion seen on recent endoscopy near the GE junction is hypermetabolic with SUV max of 8.75. Numerous retroperitoneal lymph nodes throughout the abdomen and down into the pelvis. These are hypermetabolic and consistent with lymphoma. Index 10 mm node in the left para-aortic area on image 143 has an SUV max of 5.04. 10.5 mm pericecal lymph node on image number 159 has an SUV max of 7.91. Numerous pelvic lymph nodes and inguinal lymph nodes are also hypermetabolic and consistent with lymphoma. 12 mm lateral left external iliac lymph node on image number 185 has an SUV max of 4.86. Index 9 mm left inguinal lymph node on image number 203 is an SUV max of 7.14. 10 mm right inguinal lymph node on image number 210 has an SUV max of 5.59. Incidental CT findings: Enlarged prostate gland with moderate uniform bladder wall thickening. Extensive atherosclerotic calcifications involving the  abdominal aorta and iliac arteries. Simple lower pole left renal cyst. SKELETON: No definite osseous lymphoma. There is a small soft tissue nodule adjacent to or possibly in the posterior deltoid muscle on the right is hypermetabolic with SUV max of 6.43. Incidental CT findings: none IMPRESSION: Diffuse hypermetabolic lymphadenopathy consistent with lymphoma involving the neck, axilla, mediastinum/hilum, abdomen/pelvis and inguinal regions. No definite osseous involvement. No involvement of the liver or spleen. Electronically Signed   By: Marijo Sanes M.D.   On: 10/28/2017 13:11   Ct Biopsy  Result Date: 10/25/2017 INDICATION: 72 year old male with a history of mantle cell lymphoma EXAM: CT BONE MARROW BIOPSY AND ASPIRATION; CT BIOPSY MEDICATIONS: None. ANESTHESIA/SEDATION: Moderate (conscious) sedation was employed during this procedure. A total of Versed 2.0 mg and Fentanyl 100 mcg was administered intravenously. Moderate Sedation Time: 10 minutes. The patient's level of consciousness and vital signs were monitored continuously by radiology nursing throughout the procedure under my direct supervision. FLUOROSCOPY TIME:  CT COMPLICATIONS: None PROCEDURE: The procedure risks, benefits, and alternatives were explained to the patient. Questions regarding the procedure were encouraged and answered. The patient understands and consents to the procedure. Scout CT of the pelvis was performed for surgical planning purposes. The posterior pelvis was prepped with chlorhexidinein a sterile fashion, and a sterile drape was applied covering the operative field. A sterile gown and sterile gloves were used for the procedure. Local anesthesia was provided with 1% Lidocaine. We targeted the left posterior iliac bone for biopsy. The skin and subcutaneous tissues were infiltrated with 1% lidocaine without epinephrine. A small stab incision was made with an 11 blade scalpel, and an 11 gauge Murphy needle was advanced with CT  guidance to the posterior cortex. Manual forced was used to advance the needle through the posterior cortex and the stylet was removed. A bone marrow aspirate was retrieved and passed to a cytotechnologist in the room. The Murphy needle was then advanced without the stylet for a core biopsy. The core biopsy was retrieved and also passed to a cytotechnologist. Manual pressure was used for hemostasis  and a sterile dressing was placed. No complications were encountered no significant blood loss was encountered. Patient tolerated the procedure well and remained hemodynamically stable throughout. IMPRESSION: Status post CT-guided bone marrow biopsy, with tissue specimen sent to pathology for complete histopathologic analysis Signed, Dulcy Fanny. Earleen Newport, DO [facility] Electronically Signed   By: Corrie Mckusick D.O.   On: 10/25/2017 09:47   Ct Bone Marrow Biopsy & Aspiration  Result Date: 10/25/2017 INDICATION: 72 year old male with a history of mantle cell lymphoma EXAM: CT BONE MARROW BIOPSY AND ASPIRATION; CT BIOPSY MEDICATIONS: None. ANESTHESIA/SEDATION: Moderate (conscious) sedation was employed during this procedure. A total of Versed 2.0 mg and Fentanyl 100 mcg was administered intravenously. Moderate Sedation Time: 10 minutes. The patient's level of consciousness and vital signs were monitored continuously by radiology nursing throughout the procedure under my direct supervision. FLUOROSCOPY TIME:  CT COMPLICATIONS: None PROCEDURE: The procedure risks, benefits, and alternatives were explained to the patient. Questions regarding the procedure were encouraged and answered. The patient understands and consents to the procedure. Scout CT of the pelvis was performed for surgical planning purposes. The posterior pelvis was prepped with chlorhexidinein a sterile fashion, and a sterile drape was applied covering the operative field. A sterile gown and sterile  gloves were used for the procedure. Local anesthesia was provided with 1% Lidocaine. We targeted the left posterior iliac bone for biopsy. The skin and subcutaneous tissues were infiltrated with 1% lidocaine without epinephrine. A small stab incision was made with an 11 blade scalpel, and an 11 gauge Murphy needle was advanced with CT guidance to the posterior cortex. Manual forced was used to advance the needle through the posterior cortex and the stylet was removed. A bone marrow aspirate was retrieved and passed to a cytotechnologist in the room. The Murphy needle was then advanced without the stylet for a core biopsy. The core biopsy was retrieved and also passed to a cytotechnologist. Manual pressure was used for hemostasis and a sterile dressing was placed. No complications were encountered no significant blood loss was encountered. Patient tolerated the procedure well and remained hemodynamically stable throughout. IMPRESSION: Status post CT-guided bone marrow biopsy, with tissue specimen sent to pathology for complete histopathologic analysis Signed, Dulcy Fanny. Earleen Newport, DO [facility] Electronically Signed   By: Corrie Mckusick D.O.   On: 10/25/2017 09:47    Labs:  CBC: Recent Labs    10/02/17 0824 10/17/17 1304 10/25/17 0730 10/30/17 0740  WBC 8.4 9.4 8.6 8.2  HGB 12.7* 13.0 12.8* 13.9  HCT 38.9* 39.1 38.6* 41.6  PLT 158 153 165 166    COAGS: Recent Labs    10/25/17 0730 10/30/17 0740  INR 0.92 0.97    BMP: Recent Labs    05/14/17 1429 10/02/17 0824 10/30/17 0740  NA  --  138 143  K  --  4.6 4.5  CL  --  103 107  CO2  --  28 28  GLUCOSE  --  100* 98  BUN  --  12 12  CALCIUM  --  8.2* 9.2  CREATININE 1.00 1.11 1.30*  GFRNONAA  --  >60 54*  GFRAA  --  >60 >60    LIVER FUNCTION TESTS: Recent Labs    10/17/17 1304  BILITOT 0.8  AST 15  ALT 13  ALKPHOS 92  PROT 6.7  ALBUMIN 3.6    TUMOR MARKERS: No  results for input(s): AFPTM, CEA, CA199, CHROMGRNA in  the last 8760 hours.  Assessment and Plan:  Mantle cell lymphoma.  Will proceed with placement of a Port A Cath today by Dr. Kathlene Cote.  Risks and benefits of image guided port-a-catheter placement was discussed with the patient including, but not limited to bleeding, infection, pneumothorax, or fibrin sheath development and need for additional procedures.  All of the patient's questions were answered, patient is agreeable to proceed. Consent signed and in chart.  Thank you for this interesting consult.  I greatly enjoyed meeting Chris Lopez and look forward to participating in their care.  A copy of this report was sent to the requesting provider on this date.  Electronically Signed: Murrell Redden, PA-C   10/30/2017, 8:35 AM      I spent a total of    25 Minutes in face to face in clinical consultation, greater than 50% of which was counseling/coordinating care for [facility] A Cath.

## 2017-10-30 NOTE — Sedation Documentation (Signed)
Patient is resting comfortably with eyes closed in NAD. 

## 2017-10-31 ENCOUNTER — Inpatient Hospital Stay (HOSPITAL_BASED_OUTPATIENT_CLINIC_OR_DEPARTMENT_OTHER): Payer: Medicare HMO | Admitting: Hematology

## 2017-10-31 ENCOUNTER — Encounter (HOSPITAL_COMMUNITY): Payer: Self-pay | Admitting: Hematology

## 2017-10-31 VITALS — BP 138/71 | HR 58 | Temp 98.1°F | Resp 18 | Wt 212.8 lb

## 2017-10-31 DIAGNOSIS — L539 Erythematous condition, unspecified: Secondary | ICD-10-CM | POA: Diagnosis not present

## 2017-10-31 DIAGNOSIS — R61 Generalized hyperhidrosis: Secondary | ICD-10-CM

## 2017-10-31 DIAGNOSIS — Z79899 Other long term (current) drug therapy: Secondary | ICD-10-CM | POA: Diagnosis not present

## 2017-10-31 DIAGNOSIS — C8316 Mantle cell lymphoma, intrapelvic lymph nodes: Secondary | ICD-10-CM | POA: Diagnosis not present

## 2017-10-31 DIAGNOSIS — Z5111 Encounter for antineoplastic chemotherapy: Secondary | ICD-10-CM | POA: Diagnosis not present

## 2017-10-31 MED ORDER — LIDOCAINE-PRILOCAINE 2.5-2.5 % EX CREA: TOPICAL_CREAM | CUTANEOUS | 3 refills | Status: AC

## 2017-10-31 MED ORDER — ALLOPURINOL 300 MG PO TABS: 300.0000 mg | ORAL_TABLET | Freq: Every day | ORAL | 3 refills | Status: AC

## 2017-10-31 MED ORDER — PROCHLORPERAZINE MALEATE 10 MG PO TABS: 10.0000 mg | ORAL_TABLET | Freq: Four times a day (QID) | ORAL | 1 refills | Status: AC | PRN

## 2017-10-31 NOTE — Progress Notes (Signed)
START ON PATHWAY REGIMEN - Lymphoma and CLL     A cycle is every 28 days:     Bendamustine      Rituximab   **Always confirm dose/schedule in your pharmacy ordering system**  Patient Characteristics: Mantle Cell Lymphoma, First Line, Aggressive Disease or Treatment Indicated, Stage II - IV, Not a Transplant Candidate Disease Type: Not Applicable Disease Type: Mantle Cell Lymphoma Disease Type: Not Applicable Line of Therapy: First Line [place] Stage: IV Patient Characteristics: Not a Transplant Candidate Intent of Therapy: Non-Curative / Palliative Intent, Discussed with Patient

## 2017-10-31 NOTE — Patient Instructions (Addendum)
[facility] Chemotherapy Teaching   You have been diagnosed with stage IV Mantel Cell Lymphoma.  We are going to treat you with palliative intent. This means that it is treatable but not curable.  We are going to give you chemotherapy/immunotherapy through your port a cath.  You will get Bendamustine Donnie Aho, [place]) and Rituxumab (Rituxan).  You will get treatment for 2 days every 28 days.  Each 28 days is one cycle.   We have called you in a prescription for Allopurinol and you need to take it daily to help protect your kidneys. You will see the doctor regularly throughout treatment.  We monitor your lab work prior to every treatment. The doctor monitors your response to treatment by the way you are feeling, your blood work, and scans periodically.  There will be wait times while you are here for treatment.  It will take about 30 minutes to 1 hour for your lab work to result.  Then there will be wait times while pharmacy mixes your medications.    You will receive the following premedications prior to each chemotherapy treatment: Tylenol and Benadryl:  Help prevent any reaction to the chemotherapy.   Aloxi - high powered nausea/vomiting prevention medication used for chemotherapy patients.  Dexamethasone - steroid - given to reduce the risk of you having an allergic type reaction to the chemotherapy. Dexamethason can cause you to feel energized, nervous/anxious/jittery, make you have trouble sleeping, and/or make you feel hot/flushed in the face/neck and/or look pink/red in the face/neck. These side effects will pass as the medication wears off. Pepcid - will be give to help prevent any reaction to the chemotherapy   Bendamustine Donnie Aho, [place])  About This Drug Bendamustine is used to treat cancer. It is given in the vein (IV).  Possible Side Effects . Nausea and vomiting (throwing up) . Diarrhea (loose bowel movements) . Constipation (not able to move bowels) . Fever .  Tiredness . Soreness of the mouth and throat. You may have red areas, white patches, or sores that hurt. . Decreased appetite . Cough, trouble breathing . Headache . Weight loss . Rash . Increased total bilirubin in your blood. This may mean that you have changes in your liver function. . Bone marrow suppression. This is a decrease in the number of white blood cells, red blood cells, and platelets. This may raise your risk of infection, make you tired and weak (fatigue), and raise your risk of bleeding. Note: Each of the side effects above was reported in 15% or greater of patients treated with bendamustine. Not all possible side effects are included above.  Warnings and Precautions . Severe bone marrow suppression and infections, which may be life-threatening. . Allergic reactions, including anaphylaxis are rare but may happen in some patients. Signs of allergic reaction to this drug may be swelling of the face, feeling like your tongue or throat are swelling, trouble breathing, rash, itching, fever, chills, feeling dizzy, and/or feeling that your heart is beating in a fast or not normal way. If this happens, do not take another dose of this drug. You should get urgent medical treatment. . While you are getting this drug in your vein (IV), you may have a reaction to the drug. Sometimes you may be given medication to stop or lessen these side effects. Your nurse will check you closely for these signs: fever or shaking chills, flushing, facial swelling, feeling dizzy, headache, trouble breathing, rash, itching, chest tightness, or chest pain. These reactions  may happen after your infusion. If this happens, call 911 for emergency care . Skin and tissue irritation may involve redness, pain, warmth, or swelling at the IV site. This happens if the drug leaks out of the vein and into nearby tissue. . Tumor lysis syndrome: This drug may act on the cancer cells very quickly. This may affect how  your kidneys work. . Severe allergic skin reaction, which may be life-threatening. You may develop blisters on your skin that are filled with fluid or a severe red rash all over your body that may be painful. . Changes in your liver function, which may be life-threatening . This drug may raise your risk of getting a second cancer such as leukemia Note: Some of the side effects above are very rare. If you have concerns and/or questions, please discuss them with your medical team.  Important Information . This drug may be present in the saliva, tears, sweat, urine, stool, vomit, semen, and vaginal secretions. Talk to your doctor and/or your nurse about the necessary precautions to take during this time.  Treating Side Effects . Manage tiredness by pacing your activities for the day. . Be sure to include periods of rest between energy-draining activities. . To decrease the risk of infections, wash your hands regularly. . Avoid close contact with people who have a cold, the flu, or other infections. . To decrease your risk of bleeding, use a soft toothbrush. Check with your nurse before using dental floss. . Be very careful when using knives or tools. . Use an electric shaver instead of a razor. . Ask your doctor or nurse about medicines that are available to help stop or lessen constipation. . If you are not able to move your bowels, check with your doctor or nurse before you use enemas, laxatives, or suppositories. . Drink plenty of fluids (a minimum of eight glasses per day is recommended). . If you throw up or have loose bowel movements, you should drink more fluids so that you do not become dehydrated (lack of water in the body from losing too much fluid). . If you get diarrhea, eat low-fiber foods that are high in protein and calories and avoid foods that can irritate your digestive tracts or lead to cramping. . Ask your nurse or doctor about medicine that can lessen or stop your  diarrhea. . To help with nausea and vomiting, eat small, frequent meals instead of three large meals a day. Choose foods and drinks that are at room temperature. Ask your nurse or doctor about other helpful tips and medicine that is available to help or stop lessen these symptoms. . Mouth care is very important. Your mouth care should consist of routine, gentle cleaning of your teeth or dentures and rinsing your mouth with a mixture of 1/2 teaspoon of salt in 8 ounces of water or 1/2 teaspoon of baking soda in 8 ounces of water. This should be done at least after each meal and at bedtime. . If you have mouth sores, avoid mouthwash that has alcohol. Also avoid alcohol and smoking because they can bother your mouth and throat. . Infusion reactions may happen after your infusion. If this happens, call 911 for emergency care. . To help with weight loss, drink fluids that contribute calories (whole milk, juice, soft drinks, sweetened beverages, milkshakes, and nutritional supplements) instead of water. . Include a source of protein at every meal and snack, such as meat, poultry, fish, dry beans, tofu, eggs, nuts, milk, yogurt, cheese,  ice cream, pudding, and nutritional supplements . Keeping your pain under control is important to your well-being. Please tell your doctor or nurse if you are experiencing pain. . If you get a rash do not put anything on it unless your doctor or nurse says you may. Keep the area around the rash clean and dry. Ask your doctor for medicine if your rash bothers you  Food and Drug Interactions . There are no known interactions of bendamustine with food. . This drug may interact with other medicines. Tell your doctor and pharmacist about all the medicines and dietary supplements (vitamins, minerals, herbs and others) that you are taking at this time. . The safety and use of dietary supplements and alternative diets are often not known. Using these might affect your  cancer or interfere with your treatment. Until more is known, you should not use dietary supplements or alternative diets without your cancer doctor's help.  When to Call the Doctor Call your doctor or nurse if you have any of these symptoms and/or any new or unusual symptoms: . Fever of 100.4 F (38 C) or higher . Chills . Fatigue that interferes with your daily activities . Feeling dizzy or lightheaded . You cough up yellow, green, or bloody mucus . Wheezing or trouble breathing . Headache that does not go away . Easy bleeding or bruising . No bowel movement in 3 days or when you feel uncomfortable. . Diarrhea, 4 times in one day or diarrhea with lack of strength or a feeling of being dizzy . Pain in your mouth or throat that makes it hard to eat or drink . Nausea that stops you from eating or drinking and/or is not relieved by prescribed medicines . Throwing up more than 3 times a day . Lasting loss of appetite or rapid weight loss of five pounds in a week . Flu-like symptoms: fever, headache, muscle and joint aches, and fatigue (low energy, feeling weak) . A new rash or a rash that is not relieved by prescribed medicines . Signs of allergic reaction: swelling of the face, feeling like your tongue or throat are swelling, trouble breathing, rash, itching, fever, chills, feeling dizzy, and/or feeling that your heart is beating in a fast or not normal way. . Signs of infusion reaction: fever or shaking chills, flushing, facial swelling, feeling dizzy, headache, trouble breathing, rash, itching, chest tightness, or chest pain. . While you are getting this drug, please tell your nurse right away if you have any pain, redness, or swelling at the site of the IV infusion . Signs of possible liver problems: dark urine, pale bowel movements, bad stomach pain, feeling very tired and weak, unusual itching, or yellowing of the eyes or skin . Signs of tumor lysis: Confusion or agitation,  decreased urine, nausea/vomiting, diarrhea, muscle cramping, numbness and/or tingling, seizures . If you think you may be pregnant, or may have impregnated your partner  Reproduction Warnings . Pregnancy warning: This drug can have harmful effects on the unborn baby. Women of child bearing potential should use effective methods of birth control during your cancer treatment and for at least 3 months after treatment. Men with male partners of child bearing potential should use effective methods of birth control during your cancer treatment and for at least 3 months after your cancer treatment. Let your doctor know right away if you think you may be pregnant or may have impregnated your partner. . Breastfeeding warning: It is not known if this drug passes into  breast milk. For this reason, women should talk to their doctor about the risks and benefits of breastfeeding during treatment with this drug because this drug may enter the breast milk and cause harm to a breastfeeding baby. . Fertility warning: In men this drug may affect your ability to have children in the future. Talk with your doctor or nurse if you plan to have children. Ask for information on sperm banking.  Rituximab (Rituxan)  About This Drug Rituximab is used to treat cancer. It is given in the vein (IV)  Possible Side Effects . Fever and chills . Tiredness and weakness . Infection . While you are getting this drug in your vein (IV), you may have a reaction to the drug. Sometimes you may be given medication to stop or lessen these side effects. Your nurse will check you closely for these signs: fever or shaking chills, flushing, facial swelling, feeling dizzy, headache, trouble breathing, rash, itching, chest tightness, or chest pain. These reactions may happen after your infusion. If this happens, call 911 for emergency care. . A decrease in the number of white blood cells. This may raise your risk of  infection. Note: Each of the side effects above was reported in 25% or greater of patients treated with Rituximab. Not all possible side effects are included above.  Warnings and Precautions . Tumor lysis syndrome: This drug may act on the cancer cells very quickly. This may affect how your kidneys work. . Severe infections, including viral, bacterial and fungal, which can be life-threatening. . Severe infusion reactions, which can be life-threatening. . Abnormal heart beat. . Changes in your kidney function, which can cause kidney failure and be life-threatening. . Bowel obstruction - a partial or complete blockage of your small and/or large intestine. . Perforation - a hole in your small and/or large intestine. . Severe allergic skin reaction. You may develop blisters on your skin that are filled with fluid or a severe red rash all over your body that may be painful and very rarely be fatal. You may have soreness of the mouth and throat. You may have red areas, white patches, or sores that hurt. . Reactivation of the hepatitis B virus if you have ever been exposed to the virus which can affect your liver function and cause liver failure and be life-threatening. . Changes in your central nervous system can happen. The central nervous system is made up of your brain and spinal cord. You could feel extreme tiredness, agitation, or confusion, or have hallucinations (see or hear things that are not there), trouble understanding or speaking, loss of control of your bowels or bladder, eyesight changes, numbness or lack of strength to your arms, legs, face, or body, and coma. If you start to have any of these symptoms let your doctor know right away. Note: Some of the side effects above are very rare. If you have concerns and/or questions, please discuss them with your medical team.  Important Information . Talk to your doctor before receiving any vaccinations during your treatment. Some  vaccinations are not recommended while receiving rituximab.  Treating Side Effects . Manage tiredness by pacing your activities for the day. . Be sure to include periods of rest between energy-draining activities. . To decrease the risk of infection, wash your hands regularly. . Avoid close contact with people who have a cold, the flu, or other infections. . Take your temperature as your doctor or nurse tells you, and whenever you feel like you may have  a fever. . Drink plenty of fluids (a minimum of eight glasses per day is recommended). . If you get a rash do not put anything on it unless your doctor or nurse says you may. Keep the area around the rash clean and dry. Ask your doctor for medicine if your rash bothers you. . Infusion reactions may occur after your infusion. If this happens, call 911 for emergency care.  Food and Drug Interactions . There are no known interactions of rituximab with food. . This drug may interact with other medicines. Tell your doctor and pharmacist about all the prescription and over-the-counter medicines and dietary supplements (vitamins, minerals, herbs and others) that you are taking at this time. Also, check with your doctor or pharmacist before starting any new prescription or over-the-counter medicines, or dietary supplements to make sure that there are no interactions.  When to Call the Doctor Call your doctor or nurse if you have any of these symptoms and/or any new or unusual symptoms: . Fever of 100.4 F (38 C) or higher . Chills . Feeling dizzy or lightheaded. . Confusion and/or agitation . Hallucinations . Trouble understanding or speaking . Blurry vision or changes in your eyesight . Numbness or lack of strength to your arms, legs, face, or body . Wheezing or trouble breathing . Chest pain or symptoms of a heart attack. Most heart attacks involve pain in the center of the chest that lasts more than a few minutes. The pain may go away  and come back or it can be constant. It can feel like pressure, squeezing, fullness, or pain. Sometimes pain is felt in one or both arms, the back, neck, jaw, or stomach. If any of these symptoms last 2 minutes, call 911. Marland Kitchen Feeling that your heart is beating in a fast or not normal way (palpitations) . Tiredness that interferes with your daily activities . Flu-like symptoms: fever, headache, muscle and joint aches, and fatigue (low energy, feeling weak) . Difficulty swallowing . Abdominal pain that does not go away . Throwing up more than 3 times a way . Loose bowel movements (diarrhea) 4 times a day or loose bowel movements with lack of strength or a feeling of being dizzy . No bowel movement in 3 days or when you feel uncomfortable . Decreased urine . A new rash or a rash that is not relieved by prescribed medicines . Signs of infusion reaction: fever or shaking chills, flushing, facial swelling, feeling dizzy, headache, trouble breathing, rash, itching, chest tightness, or chest pain. If this happens, call 911 for emergency care. . While you are getting this drug, please tell your nurse right away if you have any pain, redness, or swelling at the site of the injection . Signs of possible liver problems: dark urine, pale bowel movements, bad stomach pain, feeling very tired and weak, unusual itching, or yellowing of the eyes or skin . Signs of tumor lysis: Confusion or agitation, decreased urine, nausea/vomiting, diarrhea, muscle cramping, numbness and/or tingling, seizures. . If you think you may be pregnant  Reproduction Warnings . Pregnancy warning: This drug can have harmful effects on the unborn baby. Women of child bearing potential should use effective methods of birth control during your cancer treatment and for at least 12 months after treatment. Let your doctor know right away if you think you may be pregnant. . Breastfeeding warning: Women should not breastfeed during  treatment and for 6 months after treatment because this drug could enter the breast milk and  cause harm to a breastfeeding baby. . Fertility warning: Fertility studies have not been done with this drug. Talk with your doctor or nurse if you plan to have children. Ask for information on sperm or egg banking.   SELF CARE ACTIVITIES WHILE ON CHEMOTHERAPY:  Hydration Increase your fluid intake 48 hours prior to treatment and drink at least 8 to 12 cups (64 ounces) of water/decaffeinated beverages per day after treatment. You can still have your cup of coffee or soda but these beverages do not count as part of your 8 to 12 cups that you need to drink daily. No alcohol intake.  Medications Continue taking your normal prescription medication as prescribed.  If you start any new herbal or new supplements please let us know first to make sure it is safe.  Mouth Care Have teeth cleaned professionally before starting treatment. Keep dentures and partial plates clean. Use soft toothbrush and do not use mouthwashes that contain alcohol. Biotene is a good mouthwash that is available at most pharmacies or may be ordered by calling [phone]. Use warm salt water gargles (1 teaspoon salt per 1 quart warm water) before and after meals and at bedtime. Or you may rinse with 2 tablespoons of three-percent hydrogen peroxide mixed in eight ounces of water. If you are still having problems with your mouth or sores in your mouth please call the clinic. If you need dental work, please let the doctor know before you go for your appointment so that we can coordinate the best possible time for you in regards to your chemo regimen. You need to also let your dentist know that you are actively taking chemo. We may need to do labs prior to your dental appointment.  Skin Care Always use sunscreen that has not expired and with SPF (Sun Protection Factor) of 50 or higher. Wear hats to protect your head from the sun. Remember to  use sunscreen on your hands, ears, face, & feet.  Use good moisturizing lotions such as udder cream, eucerin, or even Vaseline. Some chemotherapies can cause dry skin, color changes in your skin and nails.    . Avoid long, hot showers or baths. . Use gentle, fragrance-free soaps and laundry detergent. . Use moisturizers, preferably creams or ointments rather than lotions because the thicker consistency is better at preventing skin dehydration. Apply the cream or ointment within 15 minutes of showering. Reapply moisturizer at night, and moisturize your hands every time after you wash them.  Hair Loss (if your doctor says your hair will fall out)  . If your doctor says that your hair is likely to fall out, decide before you begin chemo whether you want to wear a wig. You may want to shop before treatment to match your hair color. . Hats, turbans, and scarves can also camouflage hair loss, although some people prefer to leave their heads uncovered. If you go bare-headed outdoors, be sure to use sunscreen on your scalp. . Cut your hair short. It eases the inconvenience of shedding lots of hair, but it also can reduce the emotional impact of watching your hair fall out. . Don't perm or color your hair during chemotherapy. Those chemical treatments are already damaging to hair and can enhance hair loss. Once your chemo treatments are done and your hair has grown back, it's OK to resume dyeing or perming hair. With chemotherapy, hair loss is almost always temporary. But when it grows back, it may be a different color or texture. In  older adults who still had hair color before chemotherapy, the new growth may be completely gray.  Often, new hair is very fine and soft.  Infection Prevention Please wash your hands for at least 30 seconds using warm soapy water. Handwashing is the #1 way to prevent the spread of germs. Stay away from sick people or people who are getting over a cold. If you develop respiratory  systems such as green/yellow mucus production or productive cough or persistent cough let us know and we will see if you need an antibiotic. It is a good idea to keep a pair of gloves on when going into grocery stores/[employer] to decrease your risk of coming into contact with germs on the carts, etc. Carry alcohol hand gel with you at all times and use it frequently if out in public. If your temperature reaches 100.5 or higher please call the clinic and let us know.  If it is after hours or on the weekend please go to the ER if your temperature is over 100.5.  Please have your own personal thermometer at home to use.    Sex and bodily fluids If you are going to have sex, a condom must be used to protect the person that isn't taking chemotherapy. Chemo can decrease your libido (sex drive). For a few days after chemotherapy, chemotherapy can be excreted through your bodily fluids.  When using the toilet please close the lid and flush the toilet twice.  Do this for a few day after you have had chemotherapy.   Effects of chemotherapy on your sex life Some changes are simple and won't last long. They won't affect your sex life permanently. Sometimes you may feel: . too tired . not strong enough to be very active . sick or sore  . not in the mood . anxious or low Your anxiety might not seem related to sex. For example, you may be worried about the cancer and how your treatment is going. Or you may be worried about money, or about how you family are coping with your illness. These things can cause stress, which can affect your interest in sex. It's important to talk to your partner about how you feel. Remember - the changes to your sex life don't usually last long. There's usually no medical reason to stop having sex during chemo. The drugs won't have any long term physical effects on your performance or enjoyment of sex. Cancer can't be passed on to your partner during sex  Contraception It's important to  use reliable contraception during treatment. Avoid getting pregnant while you or your partner are having chemotherapy. This is because the drugs may harm the baby. Sometimes chemotherapy drugs can leave a man or woman infertile.  This means you would not be able to have children in the future. You might want to talk to someone about permanent infertility. It can be very difficult to learn that you may no longer be able to have children. Some people find counselling helpful. There might be ways to preserve your fertility, although this is easier for men than for women. You may want to speak to a fertility expert. You can talk about sperm banking or harvesting your eggs. You can also ask about other fertility options, such as donor eggs. If you have or have had breast cancer, your doctor might advise you not to take the contraceptive pill. This is because the hormones in it might affect the cancer.  It is not known for sure  whether or not chemotherapy drugs can be passed on through semen or secretions from the vagina. Because of this some doctors advise people to use a barrier method if you have sex during treatment. This applies to vaginal, anal or oral sex. Generally, doctors advise a barrier method only for the time you are actually having the treatment and for about a week after your treatment. Advice like this can be worrying, but this does not mean that you have to avoid being intimate with your partner. You can still have close contact with your partner and continue to enjoy sex.  Animals If you have cats or birds we just ask that you not change the litter or change the cage.  Please have someone else do this for you while you are on chemotherapy.   Food Safety During and After Cancer Treatment Food safety is important for people both during and after cancer treatment. Cancer and cancer treatments, such as chemotherapy, radiation therapy, and stem cell/bone marrow transplantation, often weaken the  immune system. This makes it harder for your body to protect itself from foodborne illness, also called food poisoning. Foodborne illness is caused by eating food that contains harmful bacteria, parasites, or viruses.  Foods to avoid Some foods have a higher risk of becoming tainted with bacteria. These include: Marland Kitchen Unwashed fresh fruit and vegetables, especially leafy vegetables that can hide dirt and other contaminants . Raw sprouts, such as alfalfa sprouts . Raw or undercooked beef, especially ground beef, or other raw or undercooked meat and poultry . Fatty, fried, or spicy foods immediately before or after treatment.  These can sit heavy on your stomach and make you feel nauseous. . Raw or undercooked shellfish, such as oysters. . Sushi and sashimi, which often contain raw fish.  . Unpasteurized beverages, such as unpasteurized fruit juices, raw milk, raw yogurt, or cider . Undercooked eggs, such as soft boiled, over easy, and poached; raw, unpasteurized eggs; or foods made with raw egg, such as homemade raw cookie dough and homemade mayonnaise Simple steps for food safety Shop smart. . Do not buy food stored or displayed in an unclean area. . Do not buy bruised or damaged fruits or vegetables. . Do not buy cans that have cracks, dents, or bulges. . Pick up foods that can spoil at the end of your shopping trip and store them in a cooler on the way home. Prepare and clean up foods carefully. . Rinse all fresh fruits and vegetables under running water, and dry them with a clean towel or paper towel. . Clean the top of cans before opening them. . After preparing food, wash your hands for 20 seconds with hot water and soap. Pay special attention to areas between fingers and under nails. . Clean your utensils and dishes with hot water and soap. Marland Kitchen Disinfect your kitchen and cutting boards using 1 teaspoon of liquid, unscented bleach mixed into 1 quart of water.   Dispose of old food. . Eat  canned and packaged food before its expiration date (the "use by" or "best before" date). . Consume refrigerated leftovers within 3 to 4 days. After that time, throw out the food. Even if the food does not smell or look spoiled, it still may be unsafe. Some bacteria, such as Listeria, can grow even on foods stored in the refrigerator if they are kept for too long. Take precautions when eating out. . At restaurants, avoid buffets and salad bars where food sits out for a long  time and comes in contact with many people. Food can become contaminated when someone with a virus, often a norovirus, or another "bug" handles it. . Put any leftover food in a "to-go" container yourself, rather than having the server do it. And, refrigerate leftovers as soon as you get home. . Choose restaurants that are clean and that are willing to prepare your food as you order it cooked.       MEDICATIONS:                                                                                                                                                                Compazine/Prochlorperazine 10mg  tablet. Take 1 tablet every 6 hours as needed for nausea/vomiting. (This can make you sleepy)   EMLA cream. Apply a quarter size amount to port site 1 hour prior to chemo. Do not rub in. Cover with plastic wrap.   Over-the-Counter Meds:  Miralax 1 capful in 8 oz of fluid daily. May increase to two times a day if needed. This is a stool softener. If this doesn't work proceed you can add:  Senokot S-start with 1 tablet two times a day and increase to 4 tablets two times a day if needed. (Total of 8 tablets in a 24 hour period). This is a stimulant laxative.   Call us if this does not help your bowels move.   Imodium 2mg  capsule. Take 2 capsules after the 1st loose stool and then 1 capsule every 2 hours until you go a total of 12 hours without having a loose stool. Call the [facility] if loose stools continue. If diarrhea  occurs @ bedtime, take 2 capsules @ bedtime. Then take 2 capsules every 4 hours until morning. Call [facility].    Diarrhea Sheet   If you are having loose stools/diarrhea, please purchase Imodium and begin taking as outlined:  At the first sign of poorly formed or loose stools you should begin taking Imodium (loperamide) 2 mg capsules.  Take two caplets (4mg ) followed by one caplet (2mg ) every 2 hours until you have had no diarrhea for 12 hours.  During the night take two caplets (4mg ) at bedtime and continue every 4 hours during the night until the morning.  Stop taking Imodium only after there is no sign of diarrhea for 12 hours.    Always call the [facility] if you are having loose stools/diarrhea that you can't get under control.  Loose stools/diarrhea leads to dehydration (loss of water) in your body.  We have other options of trying to get the loose stools/diarrhea to stop but you must let us know!      Constipation Sheet *Miralax in 8 oz. of fluid daily.  May increase to two times a day if  needed.  This is a stool softener.  If this not enough to keep your bowel regular:  You can add:  *Senokot S, start with one tablet twice a day and can increase to 4 tablets twice a day if needed.  This is a stimulant laxative.   Sometimes when you take pain medication you need BOTH a medicine to keep your stool soft and a medicine to help your bowel push it out!  Please call if the above does not work for you.   Do not go more than 2 days without a bowel movement.  It is very important that you do not become constipated.  It will make you feel sick to your stomach (nausea) and can cause abdominal pain and vomiting.    Nausea Sheet   Compazine/Prochlorperazine 10mg  tablet. Take 1 tablet every 6 hours as needed for nausea/vomiting. (This can make you sleepy)  If you are having persistent nausea (nausea that does not stop) please call the [facility] and let us know the amount of  nausea that you are experiencing.  If you begin to vomit, you need to call the [facility] and if it is the weekend and you have vomited more than one time and can't get it to stop-go to the Emergency Room.  Persistent nausea/vomiting can lead to dehydration (loss of fluid in your body) and will make you feel terrible.   Ice chips, sips of clear liquids, foods that are @ room temperature, crackers, and toast tend to be better tolerated.            SYMPTOMS TO REPORT AS SOON AS POSSIBLE AFTER TREATMENT:  FEVER GREATER THAN 100.5 F  CHILLS WITH OR WITHOUT FEVER  NAUSEA AND VOMITING THAT IS NOT CONTROLLED WITH YOUR NAUSEA MEDICATION  UNUSUAL SHORTNESS OF BREATH  UNUSUAL BRUISING OR BLEEDING  TENDERNESS IN MOUTH AND THROAT WITH OR WITHOUT PRESENCE OF ULCERS  URINARY PROBLEMS  BOWEL PROBLEMS  UNUSUAL RASH    Wear comfortable clothing and clothing appropriate for easy access to any Portacath or PICC line. Let us know if there is anything that we can do to make your therapy better!    What to do if you need assistance after hours or on the weekends: CALL [phone].  HOLD on the line, do not hang up.  You will hear multiple messages but at the end you will be connected with a nurse triage line.  They will contact the doctor if necessary.  Most of the time they will be able to assist you.  Do not call the hospital operator.      I have been informed and understand all of the instructions given to me and have received a copy. I have been instructed to call the clinic [phone] or my family physician as soon as possible for continued medical care, if indicated. I do not have any more questions at this time but understand that I may call the [facility] or the Patient Navigator at [phone] during office hours should I have questions or need assistance in obtaining follow-up care.

## 2017-10-31 NOTE — Patient Instructions (Signed)
[facility] at [facility] Discharge Instructions  Today you saw Dr. K.   Thank you for choosing [facility] at [facility] to provide your oncology and hematology care.  To afford each patient quality time with our provider, please arrive at least 15 minutes before your scheduled appointment time.   If you have a lab appointment with the Cancer Center please come in thru the  Main Entrance and check in at the main information desk  You need to re-schedule your appointment should you arrive 10 or more minutes late.  We strive to give you quality time with our providers, and arriving late affects you and other patients whose appointments are after yours.  Also, if you no show three or more times for appointments you may be dismissed from the clinic at the providers discretion.     Again, thank you for choosing [facility].  Our hope is that these requests will decrease the amount of time that you wait before being seen by our physicians.       _____________________________________________________________  Should you have questions after your visit to [facility], please contact our office at [phone] between the hours of 8:30 a.m. and 4:30 p.m.  Voicemails left after 4:30 p.m. will not be returned until the following business day.  For prescription refill requests, have your pharmacy contact our office.       Resources For Cancer Patients and their Caregivers ? American Cancer Society: Can assist with transportation, wigs, general needs, runs Look Good Feel Better.        1-888-227-6333 ? Cancer Care: Provides financial assistance, online support groups, medication/co-pay assistance.  1-800-813-HOPE (4673) ? [facility] Assists [place] cancer patients and their families through emotional , educational and financial support.  336-427-4357 ? [place] DSS Where to apply for food  stamps, Medicaid and utility assistance. 336-342-1394 ? [facility]: Transportation to medical appointments. 336-347-2287 ? Social Security Administration: May apply for disability if have a Stage IV cancer. 336-342-7796 1-800-772-1213 ? [place] Aging, Disability and Transit Services: Assists with nutrition, care and transit needs. 336-349-2343  Cancer Center Support Programs:   > Cancer Support Group  2nd Tuesday of the month 1pm-2pm, Journey Room   > Creative Journey  3rd Tuesday of the month 1130am-1pm, Journey Room    

## 2017-10-31 NOTE — Progress Notes (Signed)
Chemotherapy teaching pulled together. 

## 2017-10-31 NOTE — Progress Notes (Signed)
Patient given [email or web address] information on Bendamustane and Rituxan as well as neulasta.  I provided the patient with basic teaching, more detailed teaching will be done at bedside on day one next week. Patient verbalizes understanding and intent to call if he or his wife have any questions.

## 2017-10-31 NOTE — Assessment & Plan Note (Addendum)
1.  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 -Colonoscopy on 10/07/2017 with 5 mm polyp in transverse colon resected positive for lymphoma, ileocecal valve mucosal ulceration biopsied positive for lymphoma - PET CT scan dated 10/28/2017 shows diffuse involvement of the lymph nodes in the neck, chest, retroperitoneum and inguinal regions. -MRI of the brain dated 10/24/2017 shows no parenchymal brain lesions, but has positive bilateral intraorbital masses, right side measuring 3 cm in the left side measuring 1.2 cm. -Bone marrow biopsy on 10/17/2017+ for involvement by mantle cell lymphoma. - Echocardiogram on 10/18/2017 shows ejection fraction of 55 to 60%. -I had a prolonged discussion with the patient and his wife about the reports of the PET scan, MRI of the brain, bone marrow biopsy, blood work and echocardiogram.  I have recommended conventional chemoimmunotherapy followed by autologous HCT and maintenance rituximab.  I have told him that he has an incurable disease but we can induce complete remission by chemo immunotherapy.  I have recommended bendamustine and rituximab based on its acceptable toxicity profile, outcomes.  I do not think he is a candidate for hyper CVAD.  If he has at least a partial response to induction with this regimen, consolidation with autologous HCT is a good option. -We discussed the side effects of the chemotherapy and rituximab in detail.  Our chemotherapy nurse will also talk to him and give him information.  We will tentatively start his treatment next week.  I  will also reach out to our [place] at [facility] to see if he has any good clinical trials available.  2.  Right eye conjunctival erythema: -He has this erythema of the right eye for the last few weeks.  He has been using saline drops.  He uses them only once a day.  He will use it more frequently.  If the redness does not subside, we will prescribe him steroid drops.  This is from orbital lymphoma.

## 2017-10-31 NOTE — Progress Notes (Signed)
[name], [place] [address]   CLINIC:  Medical Oncology/Hematology  PCP:  Celene Squibb, MD [place] [address] [phone]   REASON FOR VISIT: Follow-up for Mantle Cell lymphoma  CURRENT THERAPY: Bendamustine and Rituximab every 21 days starting treatment the week of July 29th   INTERVAL HISTORY:  Mr. Chris Lopez 72 y.o. male returns for routine follow-up for mantle cell lymphoma. Patient is here today with his wife. He is having more problems with his eyes, stating his vision is getting worse and he feels pressure like there is a mass under his bottom eye lid. No pain. His appetite has decreased significantly. He only eats a small meal at night. He is starting to drink ensure daily to maintain hs weight.  He continues to have night sweats. Patient denies nausea, vomiting, or diarrhea. Denies any new pains.     REVIEW OF SYSTEMS:  Review of Systems  All other systems reviewed and are negative.    PAST MEDICAL/SURGICAL HISTORY:  Past Medical History:  Diagnosis Date  . Anxiety   . Arthritis   . BPH (benign prostatic hyperplasia)   . Chronic bronchitis   . COPD (chronic obstructive pulmonary disease) ([place])   . Depression   . Diabetes mellitus without complication ([place])    Resolved; not needing medications at this time.  Marland Kitchen GERD (gastroesophageal reflux disease)   . Hypertension   . Lymphoma, mantle cell ([place])    Past Surgical History:  Procedure Laterality Date  . BIOPSY  10/07/2017   Procedure: BIOPSY;  Surgeon: Daneil Dolin, MD;  [facility];  Service: Endoscopy;;  gastroesophageal junction bx ileocecal valve  . carpal tunnel right hand    . CERVICAL FUSION    . COLONOSCOPY WITH PROPOFOL N/A 10/07/2017   Procedure: COLONOSCOPY WITH PROPOFOL;  Surgeon: Daneil Dolin, MD;  [facility];  Service: Endoscopy;  Laterality: N/A;  10:00am  . ESOPHAGOGASTRODUODENOSCOPY (EGD) WITH PROPOFOL N/A  10/07/2017   Procedure: ESOPHAGOGASTRODUODENOSCOPY (EGD) WITH PROPOFOL;  Surgeon: Daneil Dolin, MD;  [facility];  Service: Endoscopy;  Laterality: N/A;  . IR IMAGING GUIDED PORT INSERTION  10/30/2017  . left knee replacement    . left knee revised X 3    . multiple knee arthroscopies    . POLYPECTOMY  10/07/2017   Procedure: POLYPECTOMY;  Surgeon: Daneil Dolin, MD;  [facility];  Service: Endoscopy;;  colon  . right knee replacement    . tarsal tunnel repair    . THYROIDECTOMY, PARTIAL    . TONSILLECTOMY       SOCIAL HISTORY:  Social History   Socioeconomic History  . Marital status: Married    Spouse name: Jonelle Sidle  . Number of children: 4  . Years of education: Not on file  . Highest education level: Bachelor's degree (e.g., BA, AB, BS)  Occupational History  . 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 Financial resource strain: Somewhat hard  . Food insecurity:    Worry: Sometimes true    Inability: Never true  . Transportation needs:    Medical: No    Non-medical: No  Tobacco Use  . Smoking status: Current Every Day Smoker    Packs/day: 1.25  Years: 50.00    Pack years: 62.50    Types: Cigarettes  . Smokeless tobacco: Never Used  Substance and Sexual Activity  . Alcohol use: No  . Drug use: No  . Sexual activity: Yes    Partners: Female    Birth control/protection: None    Comment: spouse  Lifestyle  . Physical activity:    Days per week: 3 days    Minutes per session: 150+ min  . Stress: Very much  Relationships  . Social connections:    Talks on phone: Once a week    Gets together: Never    Attends religious service: Never    Active member of club or organization: No    Attends meetings of clubs or organizations: Never    Relationship status: Married  . Intimate partner violence:     Fear of current or ex partner: No    Emotionally abused: No    Physically abused: No    Forced sexual activity: No  Other Topics Concern  . Not on file  Social History Narrative  . Not on file    FAMILY HISTORY:  Family History  Problem Relation Age of Onset  . Colon cancer Maternal Grandmother   . Colon cancer Paternal Grandmother   . Heart disease Mother   . Arthritis Mother   . Prostate cancer Father   . Heart attack Brother   . Heart disease Maternal Aunt   . Cancer Maternal Aunt   . Heart disease Maternal Uncle   . Cancer Maternal Uncle   . Heart disease Paternal Aunt   . Heart disease Paternal Uncle   . Heart disease Brother   . Heart attack Brother   . Arthritis Brother     CURRENT MEDICATIONS:  Outpatient Encounter Medications as of 10/31/2017  Medication Sig Note  . albuterol (PROVENTIL HFA;VENTOLIN HFA) 108 (90 Base) MCG/ACT inhaler Inhale 2 puffs into the lungs every 6 (six) hours as needed for wheezing or shortness of breath.   Marland Kitchen amLODipine (NORVASC) 5 MG tablet Take 5 mg by mouth every evening.   Marland Kitchen aspirin 325 MG tablet Take 325 mg by mouth daily.    Marland Kitchen atorvastatin (LIPITOR) 20 MG tablet Take 20 mg by mouth every evening. Reported on 06/07/2015   . diclofenac (VOLTAREN) 75 MG EC tablet TAKE 1 TABLET TWICE DAILY   . gabapentin (NEURONTIN) 600 MG tablet Take 600 mg by mouth 3 (three) times daily.   Marland Kitchen LORazepam (ATIVAN) 2 MG tablet Take 2 mg by mouth at bedtime.   . metoprolol (LOPRESSOR) 50 MG tablet Take 50 mg by mouth every evening.    Marland Kitchen omeprazole (PRILOSEC OTC) 20 MG tablet Take 20 mg by mouth 2 (two) times daily. Reported on 06/07/2015   . oxyCODONE-acetaminophen (PERCOCET) 7.5-325 MG tablet Take 1 tablet by mouth every 4 (four) hours as needed.   . Tamsulosin HCl (FLOMAX) 0.4 MG CAPS Take 0.4 mg by mouth daily. Reported on 06/07/2015   . venlafaxine (EFFEXOR-XR) 150 MG 24 hr capsule Take 150 mg by mouth every evening.    . [DISCONTINUED] polyethylene  glycol-electrolytes (TRILYTE) 420 g solution Take 4,000 mLs by mouth as directed. 09/25/2017: Before colonoscopy  . nitroGLYCERIN (NITROSTAT) 0.4 MG SL tablet Place 0.4 mg under the tongue every 5 (five) minutes x 3 doses as needed. For chest pain    No facility-administered encounter medications on file as of 10/31/2017.     ALLERGIES:  Allergies  Allergen Reactions  . Bee Venom  Anaphylaxis    Allergic to bee stings not the kit  . Feldene [Piroxicam] Rash and Dermatitis     PHYSICAL EXAM:  ECOG Performance status: 1  Vitals:   10/31/17 0910  BP: 138/71  Pulse: (!) 58  Resp: 18  Temp: 98.1 F (36.7 C)  SpO2: 98%   Filed Weights   10/31/17 0910  Weight: 212 lb 12.8 oz (96.5 kg)    Physical Exam HEENT: Right eye has conjunctival erythema particularly in the lateral side.  Left eye has no erythema.  Lymph nodes in the neck are palpable.  Port site is well-healed.  LABORATORY DATA:  I have reviewed the labs as listed.  CBC    Component Value Date/Time   WBC 8.2 10/30/2017 0740   RBC 4.80 10/30/2017 0740   HGB 13.9 10/30/2017 0740   HCT 41.6 10/30/2017 0740   PLT 166 10/30/2017 0740   MCV 86.7 10/30/2017 0740   MCH 29.0 10/30/2017 0740   MCHC 33.4 10/30/2017 0740   RDW 14.8 10/30/2017 0740   LYMPHSABS 2.3 10/25/2017 0730   MONOABS 0.6 10/25/2017 0730   EOSABS 0.4 10/25/2017 0730   BASOSABS 0.1 10/25/2017 0730   CMP Latest Ref Rng & Units 10/30/2017 10/17/2017 10/02/2017  Glucose 70 - 99 mg/dL 98 - 100(H)  BUN 8 - 23 mg/dL 12 - 12  Creatinine 0.61 - 1.24 mg/dL 1.30(H) - 1.11  Sodium 135 - 145 mmol/L 143 - 138  Potassium 3.5 - 5.1 mmol/L 4.5 - 4.6  Chloride 98 - 111 mmol/L 107 - 103  CO2 22 - 32 mmol/L 28 - 28  Calcium 8.9 - 10.3 mg/dL 9.2 - 8.2(L)  Total Protein 6.5 - 8.1 g/dL - 6.7 -  Total Bilirubin 0.3 - 1.2 mg/dL - 0.8 -  Alkaline Phos 38 - 126 U/L - 92 -  AST 15 - 41 U/L - 15 -  ALT 0 - 44 U/L - 13 -       DIAGNOSTIC IMAGING:  I have independently  reviewed the images of the MRI of the brain and the PET CT scan dated 10/28/2017 and reviewed with the patient.     ASSESSMENT & PLAN:   Mantle cell lymphoma (HCC) 1.  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 -Colonoscopy on 10/07/2017 with 5 mm polyp in transverse colon resected positive for lymphoma, ileocecal valve mucosal ulceration biopsied positive for lymphoma - PET CT scan dated 10/28/2017 shows diffuse involvement of the lymph nodes in the neck, chest, retroperitoneum and inguinal regions. -MRI of the brain dated 10/24/2017 shows no parenchymal brain lesions, but has positive bilateral intraorbital masses, right side measuring 3 cm in the left side measuring 1.2 cm. -Bone marrow biopsy on 10/17/2017+ for involvement by mantle cell lymphoma. - Echocardiogram on 10/18/2017 shows ejection fraction of 55 to 60%. -I had a prolonged discussion with the patient and his wife about the reports of the PET scan, MRI of the brain, bone marrow biopsy, blood work and echocardiogram.  I have recommended conventional chemoimmunotherapy followed by autologous HCT and maintenance rituximab.  I have told him that he has an incurable disease but we can induce complete remission by chemo immunotherapy.  I have recommended bendamustine and rituximab based on its acceptable toxicity profile, outcomes.  I do not think he is a candidate for hyper CVAD.  If he has at least a partial response to induction with this regimen, consolidation with autologous HCT is a good option. -We discussed the side effects of  the chemotherapy and rituximab in detail.  Our chemotherapy nurse will also talk to him and give him information.  We will tentatively start his treatment next week.  I will also reach out to our [facility] to see if he has any good clinical trials available.  2.  Right eye conjunctival erythema: -He has this erythema of the right eye for the last few weeks.  He has been using saline drops.  He uses them only once a day.  He will use it more frequently.  If the redness does not subside, we will prescribe him steroid drops.  This is from orbital lymphoma.  Total time spent is 40 minutes with more than 50% of the time spent face-to-face discussing diagnosis, stage, prognosis, treatment options and side effects.    Orders placed this encounter:  Orders Placed This Encounter  Procedures  . CBC with Differential/Platelet  . Comprehensive metabolic panel  . Magnesium  . CBC with Differential/Platelet  . Comprehensive metabolic panel      Derek Jack, MD [place] [phone]

## 2017-11-04 ENCOUNTER — Inpatient Hospital Stay (HOSPITAL_COMMUNITY): Payer: Medicare HMO

## 2017-11-04 ENCOUNTER — Encounter (HOSPITAL_COMMUNITY): Payer: Self-pay

## 2017-11-04 VITALS — BP 126/70 | HR 83 | Temp 97.4°F | Resp 18 | Wt 216.4 lb

## 2017-11-04 DIAGNOSIS — Z79899 Other long term (current) drug therapy: Secondary | ICD-10-CM | POA: Diagnosis not present

## 2017-11-04 DIAGNOSIS — L539 Erythematous condition, unspecified: Secondary | ICD-10-CM | POA: Diagnosis not present

## 2017-11-04 DIAGNOSIS — C8318 Mantle cell lymphoma, lymph nodes of multiple sites: Secondary | ICD-10-CM

## 2017-11-04 DIAGNOSIS — R61 Generalized hyperhidrosis: Secondary | ICD-10-CM | POA: Diagnosis not present

## 2017-11-04 DIAGNOSIS — C8316 Mantle cell lymphoma, intrapelvic lymph nodes: Secondary | ICD-10-CM | POA: Diagnosis not present

## 2017-11-04 DIAGNOSIS — Z5111 Encounter for antineoplastic chemotherapy: Secondary | ICD-10-CM | POA: Diagnosis not present

## 2017-11-04 LAB — CBC WITH DIFFERENTIAL/PLATELET
BASOS ABS: 0 10*3/uL (ref 0.0–0.1)
BASOS PCT: 1 %
EOS ABS: 0.3 10*3/uL (ref 0.0–0.7)
EOS PCT: 4 %
HCT: 35.6 % — ABNORMAL LOW (ref 39.0–52.0)
Hemoglobin: 11.5 g/dL — ABNORMAL LOW (ref 13.0–17.0)
Lymphocytes Relative: 26 %
Lymphs Abs: 1.9 10*3/uL (ref 0.7–4.0)
MCH: 28.1 pg (ref 26.0–34.0)
MCHC: 32.3 g/dL (ref 30.0–36.0)
MCV: 87 fL (ref 78.0–100.0)
MONO ABS: 0.6 10*3/uL (ref 0.1–1.0)
MONOS PCT: 8 %
Neutro Abs: 4.6 10*3/uL (ref 1.7–7.7)
Neutrophils Relative %: 61 %
Platelets: 121 10*3/uL — ABNORMAL LOW (ref 150–400)
RBC: 4.09 MIL/uL — ABNORMAL LOW (ref 4.22–5.81)
RDW: 14.9 % (ref 11.5–15.5)
WBC: 7.5 10*3/uL (ref 4.0–10.5)

## 2017-11-04 LAB — COMPREHENSIVE METABOLIC PANEL
ALBUMIN: 3.4 g/dL — AB (ref 3.5–5.0)
ALT: 14 U/L (ref 0–44)
AST: 16 U/L (ref 15–41)
Alkaline Phosphatase: 78 U/L (ref 38–126)
Anion gap: 7 (ref 5–15)
BILIRUBIN TOTAL: 0.7 mg/dL (ref 0.3–1.2)
BUN: 10 mg/dL (ref 8–23)
CALCIUM: 8.4 mg/dL — AB (ref 8.9–10.3)
CO2: 28 mmol/L (ref 22–32)
CREATININE: 1.07 mg/dL (ref 0.61–1.24)
Chloride: 100 mmol/L (ref 98–111)
GFR calc Af Amer: >60 mL/min (ref >60)
GFR calc non Af Amer: >60 mL/min (ref >60)
GLUCOSE: 90 mg/dL (ref 70–99)
Potassium: 4.5 mmol/L (ref 3.5–5.1)
SODIUM: 135 mmol/L (ref 135–145)
TOTAL PROTEIN: 6.1 g/dL — AB (ref 6.5–8.1)

## 2017-11-04 LAB — URIC ACID: URIC ACID, SERUM: 5 mg/dL (ref 3.7–8.6)

## 2017-11-04 LAB — LACTATE DEHYDROGENASE: LDH: 104 U/L (ref 98–192)

## 2017-11-04 MED ORDER — SODIUM CHLORIDE 0.9 % IV SOLN: 10.0000 mg | Freq: Once | INTRAVENOUS | Status: DC

## 2017-11-04 MED ORDER — SODIUM CHLORIDE 0.9 % IV SOLN
90.0000 mg/m2 | Freq: Once | INTRAVENOUS | Status: AC
  Administered 2017-11-04: 200 mg via INTRAVENOUS
  Filled 2017-11-04: qty 8

## 2017-11-04 MED ORDER — SODIUM CHLORIDE 0.9 % IV SOLN
Freq: Once | INTRAVENOUS | Status: AC
  Administered 2017-11-04: 10:00:00 via INTRAVENOUS

## 2017-11-04 MED ORDER — PALONOSETRON HCL INJECTION 0.25 MG/5ML
INTRAVENOUS | Status: AC
  Filled 2017-11-04: qty 5

## 2017-11-04 MED ORDER — DEXAMETHASONE SODIUM PHOSPHATE 10 MG/ML IJ SOLN
INTRAMUSCULAR | Status: AC
  Filled 2017-11-04: qty 1

## 2017-11-04 MED ORDER — DEXAMETHASONE SODIUM PHOSPHATE 10 MG/ML IJ SOLN
10.0000 mg | Freq: Once | INTRAMUSCULAR | Status: AC
  Administered 2017-11-04: 10 mg via INTRAVENOUS

## 2017-11-04 MED ORDER — ACETAMINOPHEN 325 MG PO TABS
ORAL_TABLET | ORAL | Status: AC
Start: 2017-11-04 — End: ?
  Filled 2017-11-04: qty 2

## 2017-11-04 MED ORDER — PALONOSETRON HCL INJECTION 0.25 MG/5ML
0.2500 mg | Freq: Once | INTRAVENOUS | Status: AC
  Administered 2017-11-04: 0.25 mg via INTRAVENOUS

## 2017-11-04 MED ORDER — SODIUM CHLORIDE 0.9% FLUSH
10.0000 mL | INTRAVENOUS | Status: DC | PRN
  Administered 2017-11-04: 10 mL
  Filled 2017-11-04: qty 10

## 2017-11-04 MED ORDER — DIPHENHYDRAMINE HCL 50 MG/ML IJ SOLN
50.0000 mg | Freq: Once | INTRAMUSCULAR | Status: AC
  Administered 2017-11-04: 50 mg via INTRAVENOUS

## 2017-11-04 MED ORDER — HEPARIN SOD (PORK) LOCK FLUSH 100 UNIT/ML IV SOLN
500.0000 [IU] | Freq: Once | INTRAVENOUS | Status: AC | PRN
  Administered 2017-11-04: 500 [IU]

## 2017-11-04 MED ORDER — OCTREOTIDE ACETATE 30 MG IM KIT
PACK | INTRAMUSCULAR | Status: AC
  Filled 2017-11-04: qty 1

## 2017-11-04 MED ORDER — ACETAMINOPHEN 325 MG PO TABS
650.0000 mg | ORAL_TABLET | Freq: Once | ORAL | Status: AC
  Administered 2017-11-04: 650 mg via ORAL

## 2017-11-04 MED ORDER — DIPHENHYDRAMINE HCL 50 MG/ML IJ SOLN
INTRAMUSCULAR | Status: AC
  Filled 2017-11-04: qty 1

## 2017-11-04 MED ORDER — RITUXIMAB CHEMO INJECTION 500 MG/50ML
375.0000 mg/m2 | Freq: Once | INTRAVENOUS | Status: AC
  Administered 2017-11-04: 800 mg via INTRAVENOUS
  Filled 2017-11-04: qty 30

## 2017-11-04 MED ORDER — FAMOTIDINE IN NACL 20-0.9 MG/50ML-% IV SOLN
INTRAVENOUS | Status: AC
  Filled 2017-11-04: qty 50

## 2017-11-04 MED ORDER — FAMOTIDINE IN NACL 20-0.9 MG/50ML-% IV SOLN
20.0000 mg | Freq: Once | INTRAVENOUS | Status: AC
  Administered 2017-11-04: 20 mg via INTRAVENOUS

## 2017-11-04 NOTE — Progress Notes (Signed)
Chris Lopez tolerated chemo tx well without complaints or incident. Pt and wife instructed in purpose and side effects of Rituxan and Bendamustine and given written information on both. Understanding was verbalized and questions were answered. VSS upon discharge. Pt discharged via wheelchair in satisfactory condition accompanied by his wife

## 2017-11-04 NOTE — Patient Instructions (Signed)
[place] at [facility]  Discharge Instructions:  Today you received your Rituximab and Bendamustine infusions. Follow up as scheduled. Call clinic for any questions or concerns. _______________________________________________________________  Thank you for choosing [place] at [facility] to provide your oncology and hematology care.  To afford each patient quality time with our providers, please arrive at least 15 minutes before your scheduled appointment.  You need to re-schedule your appointment if you arrive 10 or more minutes late.  We strive to give you quality time with our providers, and arriving late affects you and other patients whose appointments are after yours.  Also, if you no show three or more times for appointments you may be dismissed from the clinic.  Again, thank you for choosing [place] at [place] hope is that these requests will allow you access to exceptional care and in a timely manner. _______________________________________________________________  If you have questions after your visit, please contact our office at [phone] between the hours of 8:30 a.m. and 5:00 p.m. Voicemails left after 4:30 p.m. will not be returned until the following business day. _______________________________________________________________  For prescription refill requests, have your pharmacy contact our office. _______________________________________________________________  Recommendations made by the consultant and any test results will be sent to your referring physician. _______________________________________________________________

## 2017-11-04 NOTE — Progress Notes (Signed)
Labs reviewed with Dr. Delton Coombes who approved patient for treatment today.

## 2017-11-05 ENCOUNTER — Inpatient Hospital Stay (HOSPITAL_COMMUNITY): Payer: Medicare HMO | Attending: Hematology

## 2017-11-05 ENCOUNTER — Encounter (HOSPITAL_COMMUNITY): Payer: Self-pay

## 2017-11-05 ENCOUNTER — Encounter (HOSPITAL_COMMUNITY): Payer: Self-pay | Admitting: Hematology

## 2017-11-05 ENCOUNTER — Encounter (HOSPITAL_COMMUNITY): Payer: Self-pay | Admitting: General Practice

## 2017-11-05 VITALS — BP 115/65 | HR 56 | Temp 97.5°F | Resp 18 | Wt 223.2 lb

## 2017-11-05 DIAGNOSIS — Z7689 Persons encountering health services in other specified circumstances: Secondary | ICD-10-CM | POA: Diagnosis not present

## 2017-11-05 DIAGNOSIS — L539 Erythematous condition, unspecified: Secondary | ICD-10-CM | POA: Insufficient documentation

## 2017-11-05 DIAGNOSIS — R61 Generalized hyperhidrosis: Secondary | ICD-10-CM | POA: Diagnosis not present

## 2017-11-05 DIAGNOSIS — Z5111 Encounter for antineoplastic chemotherapy: Secondary | ICD-10-CM | POA: Insufficient documentation

## 2017-11-05 DIAGNOSIS — Z79899 Other long term (current) drug therapy: Secondary | ICD-10-CM | POA: Diagnosis not present

## 2017-11-05 DIAGNOSIS — C8316 Mantle cell lymphoma, intrapelvic lymph nodes: Secondary | ICD-10-CM | POA: Diagnosis not present

## 2017-11-05 DIAGNOSIS — C8318 Mantle cell lymphoma, lymph nodes of multiple sites: Secondary | ICD-10-CM

## 2017-11-05 LAB — KAPPA/LAMBDA LIGHT CHAINS
KAPPA, LAMDA LIGHT CHAIN RATIO: 2.07 — AB (ref 0.26–1.65)
Kappa free light chain: 30.6 mg/L — ABNORMAL HIGH (ref 3.3–19.4)
LAMDA FREE LIGHT CHAINS: 14.8 mg/L (ref 5.7–26.3)

## 2017-11-05 MED ORDER — SODIUM CHLORIDE 0.9 % IV SOLN
INTRAVENOUS | Status: DC
  Administered 2017-11-05: 10:00:00 via INTRAVENOUS

## 2017-11-05 MED ORDER — BENDAMUSTINE HCL CHEMO INJECTION 100 MG/4ML
90.0000 mg/m2 | Freq: Once | INTRAVENOUS | Status: AC
  Administered 2017-11-05: 200 mg via INTRAVENOUS
  Filled 2017-11-05: qty 8

## 2017-11-05 MED ORDER — HEPARIN SOD (PORK) LOCK FLUSH 100 UNIT/ML IV SOLN
500.0000 [IU] | Freq: Once | INTRAVENOUS | Status: AC | PRN
  Administered 2017-11-05: 500 [IU]

## 2017-11-05 MED ORDER — SODIUM CHLORIDE 0.9% FLUSH
10.0000 mL | INTRAVENOUS | Status: DC | PRN
  Administered 2017-11-05: 10 mL
  Filled 2017-11-05: qty 10

## 2017-11-05 MED ORDER — PEGFILGRASTIM 6 MG/0.6ML ~~LOC~~ PSKT
6.0000 mg | PREFILLED_SYRINGE | Freq: Once | SUBCUTANEOUS | Status: AC
  Administered 2017-11-05: 6 mg via SUBCUTANEOUS

## 2017-11-05 MED ORDER — PEGFILGRASTIM 6 MG/0.6ML ~~LOC~~ PSKT
PREFILLED_SYRINGE | SUBCUTANEOUS | Status: AC
  Filled 2017-11-05: qty 0.6

## 2017-11-05 MED ORDER — DEXAMETHASONE SODIUM PHOSPHATE 10 MG/ML IJ SOLN
10.0000 mg | Freq: Once | INTRAMUSCULAR | Status: AC
  Administered 2017-11-05: 10 mg via INTRAVENOUS

## 2017-11-05 MED ORDER — DEXAMETHASONE SODIUM PHOSPHATE 10 MG/ML IJ SOLN
INTRAMUSCULAR | Status: AC
Start: 2017-11-05 — End: ?
  Filled 2017-11-05: qty 1

## 2017-11-05 MED ORDER — DEXAMETHASONE SODIUM PHOSPHATE 100 MG/10ML IJ SOLN: 10.0000 mg | Freq: Once | INTRAMUSCULAR | Status: DC

## 2017-11-05 MED ORDER — SODIUM CHLORIDE 0.9 % IV SOLN: INTRAVENOUS | Status: DC

## 2017-11-05 NOTE — Progress Notes (Signed)
[facility] CSW Progress Notes  Check in call to patient to assess for needs/resources after beginning treatment; no Distress Screen completed.  Per patient "things are going well, no concerns at this time."  Has support in the home and transportation needed.  Aware of how to reach out to CSW if needed.  Edwyna Shell, LCSW Clinical Social Worker Phone:  [phone]

## 2017-11-05 NOTE — Patient Instructions (Signed)
[facility] Discharge Instructions for Patients Receiving Chemotherapy  Today you received the following chemotherapy agents bendamustine.    If you develop nausea and vomiting that is not controlled by your nausea medication, call the clinic.   BELOW ARE SYMPTOMS THAT SHOULD BE REPORTED IMMEDIATELY:  *FEVER GREATER THAN 100.5 F  *CHILLS WITH OR WITHOUT FEVER  NAUSEA AND VOMITING THAT IS NOT CONTROLLED WITH YOUR NAUSEA MEDICATION  *UNUSUAL SHORTNESS OF BREATH  *UNUSUAL BRUISING OR BLEEDING  TENDERNESS IN MOUTH AND THROAT WITH OR WITHOUT PRESENCE OF ULCERS  *URINARY PROBLEMS  *BOWEL PROBLEMS  UNUSUAL RASH Items with * indicate a potential emergency and should be followed up as soon as possible.  Feel free to call the clinic should you have any questions or concerns. The clinic phone number is [phone].  Please show the [facility] at check-in to the Emergency Department and triage nurse.

## 2017-11-05 NOTE — Progress Notes (Signed)
To treatment room for day two of chemotherapy.  Patient stated he removed his huber needle "in his sleep" last night.  He brought the needle for [place] to put into the sharps container.  Patient reaccessed without difficulty.  No complaints of pain.  Family at side with no s/s of distress noted.   Patient tolerated chemotherapy with no complaints voiced. Port site clean and dry with no bruising or swelling noted at site.  Neulasta Onpro applied to left arm with green indicator light flashing.  Reviewed Onpro with written instructions given.  Understanding verbalized.  Band aid applied to port.  VSS with discharge and left ambulatory with no s/s of distress noted.

## 2017-11-06 ENCOUNTER — Telehealth (HOSPITAL_COMMUNITY): Payer: Self-pay | Admitting: *Deleted

## 2017-11-06 NOTE — Telephone Encounter (Signed)
Contacted for 24 hour f/u after first cycle of chemotherapy.  Denies any s/s, no complaints besides "feeling a little tired".  Reports Neulasta OnPro is in place with green light blinking.  Instructed to call the clinic with any questions or concerns.  Verbalizes understanding.

## 2017-11-07 ENCOUNTER — Encounter: Payer: Self-pay | Admitting: Internal Medicine

## 2017-11-13 ENCOUNTER — Other Ambulatory Visit: Payer: Self-pay | Admitting: Orthopaedic Surgery

## 2017-11-13 MED ORDER — OXYCODONE-ACETAMINOPHEN 7.5-325 MG PO TABS: 1.0000 | ORAL_TABLET | ORAL | 0 refills | Status: DC | PRN

## 2017-11-19 ENCOUNTER — Encounter: Payer: Self-pay | Admitting: Orthopaedic Surgery

## 2017-11-19 ENCOUNTER — Ambulatory Visit: Payer: Medicare HMO | Admitting: Orthopaedic Surgery

## 2017-11-19 VITALS — BP 93/59 | HR 78 | Ht 72.0 in | Wt 212.0 lb

## 2017-11-19 DIAGNOSIS — C8318 Mantle cell lymphoma, lymph nodes of multiple sites: Secondary | ICD-10-CM

## 2017-11-19 DIAGNOSIS — M79605 Pain in left leg: Secondary | ICD-10-CM | POA: Diagnosis not present

## 2017-11-19 DIAGNOSIS — Z6831 Body mass index (BMI) 31.0-31.9, adult: Secondary | ICD-10-CM | POA: Diagnosis not present

## 2017-11-19 DIAGNOSIS — C8310 Mantle cell lymphoma, unspecified site: Secondary | ICD-10-CM | POA: Diagnosis not present

## 2017-11-19 DIAGNOSIS — M25561 Pain in right knee: Secondary | ICD-10-CM

## 2017-11-19 DIAGNOSIS — F339 Major depressive disorder, recurrent, unspecified: Secondary | ICD-10-CM | POA: Diagnosis not present

## 2017-11-19 DIAGNOSIS — F17200 Nicotine dependence, unspecified, uncomplicated: Secondary | ICD-10-CM

## 2017-11-19 DIAGNOSIS — M25562 Pain in left knee: Secondary | ICD-10-CM | POA: Diagnosis not present

## 2017-11-19 DIAGNOSIS — G4700 Insomnia, unspecified: Secondary | ICD-10-CM | POA: Diagnosis not present

## 2017-11-19 DIAGNOSIS — F419 Anxiety disorder, unspecified: Secondary | ICD-10-CM | POA: Diagnosis not present

## 2017-11-19 DIAGNOSIS — G8929 Other chronic pain: Secondary | ICD-10-CM

## 2017-11-19 DIAGNOSIS — Z720 Tobacco use: Secondary | ICD-10-CM | POA: Diagnosis not present

## 2017-11-19 NOTE — Progress Notes (Signed)
Patient Chris Lopez:XAJO Chris Lopez, male DOB:06-Jun-1945, 72 y.o. INO:[id number]  Chief Complaint  Patient presents with  . Knee Pain    f/u bilateral knee pain    HPI  Chris Lopez is a 72 y.o. male who has chronic pain of both total knees, more on the left.  He has recently been diagnosed with mantel cell lymphoma.  He has undergone chemotherapy and is scheduled for more later this month. He is at Stage 4 with multiple metastasis.  He has had PET scan and has a porta cath.  He has no new trauma, his knees are about the same.  He is taking his pain medicine.  PET scan shows: IMPRESSION: Diffuse hypermetabolic lymphadenopathy consistent with lymphoma involving the neck, axilla, mediastinum/hilum, abdomen/pelvis and inguinal regions. No definite osseous involvement.  No involvement of the liver or spleen.  Body mass index is 28.75 kg/m.  ROS  Review of Systems  HENT: Negative for congestion.   Respiratory: Positive for shortness of breath. Negative for cough.   Cardiovascular: Negative for chest pain and leg swelling.  Endocrine: Positive for cold intolerance.  Musculoskeletal: Positive for arthralgias, back pain, gait problem and myalgias.  Allergic/Immunologic: Positive for environmental allergies.  All other systems reviewed and are negative.   All other systems reviewed and are negative.  Past Medical History:  Diagnosis Date  . Anxiety   . Arthritis   . BPH (benign prostatic hyperplasia)   . Chronic bronchitis   . COPD (chronic obstructive pulmonary disease) ([place])   . Depression   . Diabetes mellitus without complication ([place])    Resolved; not needing medications at this time.  Marland Kitchen GERD (gastroesophageal reflux disease)   . Hypertension   . Lymphoma, mantle cell ([place])     Past Surgical History:  Procedure Laterality Date  . BIOPSY  10/07/2017   Procedure: BIOPSY;  Surgeon: Daneil Dolin, MD;  [facility];  Service: Endoscopy;;  gastroesophageal junction  bx ileocecal valve  . carpal tunnel right hand    . CERVICAL FUSION    . COLONOSCOPY WITH PROPOFOL N/A 10/07/2017   Procedure: COLONOSCOPY WITH PROPOFOL;  Surgeon: Daneil Dolin, MD;  [facility];  Service: Endoscopy;  Laterality: N/A;  10:00am  . ESOPHAGOGASTRODUODENOSCOPY (EGD) WITH PROPOFOL N/A 10/07/2017   Procedure: ESOPHAGOGASTRODUODENOSCOPY (EGD) WITH PROPOFOL;  Surgeon: Daneil Dolin, MD;  [facility];  Service: Endoscopy;  Laterality: N/A;  . IR IMAGING GUIDED PORT INSERTION  10/30/2017  . left knee replacement    . left knee revised X 3    . multiple knee arthroscopies    . POLYPECTOMY  10/07/2017   Procedure: POLYPECTOMY;  Surgeon: Daneil Dolin, MD;  [facility];  Service: Endoscopy;;  colon  . right knee replacement    . tarsal tunnel repair    . THYROIDECTOMY, PARTIAL    . TONSILLECTOMY      Family History  Problem Relation Age of Onset  . Colon cancer Maternal Grandmother   . Colon cancer Paternal Grandmother   . Heart disease Mother   . Arthritis Mother   . Prostate cancer Father   . Heart attack Brother   . Heart disease Maternal Aunt   . Cancer Maternal Aunt   . Heart disease Maternal Uncle   . Cancer Maternal Uncle   . Heart disease Paternal Aunt   . Heart disease Paternal Uncle   . Heart disease Brother   . Heart attack Brother   .  Arthritis Brother     Social History Social History   Tobacco Use  . Smoking status: Current Every Day Smoker    Packs/day: 1.25    Years: 50.00    Pack years: 62.50    Types: Cigarettes  . Smokeless tobacco: Never Used  Substance Use Topics  . Alcohol use: No  . Drug use: No    Allergies  Allergen Reactions  . Bee Venom Anaphylaxis    Allergic to bee stings not the kit  . Feldene [Piroxicam] Rash and Dermatitis    Current Outpatient Medications  Medication Sig Dispense Refill  . albuterol (PROVENTIL HFA;VENTOLIN HFA) 108 (90 Base) MCG/ACT inhaler Inhale 2 puffs into the  lungs every 6 (six) hours as needed for wheezing or shortness of breath.    . allopurinol (ZYLOPRIM) 300 MG tablet Take 1 tablet (300 mg total) by mouth daily. 30 tablet 3  . amLODipine (NORVASC) 5 MG tablet Take 5 mg by mouth every evening.    Marland Kitchen aspirin 325 MG tablet Take 325 mg by mouth daily.     Marland Kitchen atorvastatin (LIPITOR) 20 MG tablet Take 20 mg by mouth every evening. Reported on 06/07/2015    . bendamustine in sodium chloride 0.9 % 50 mL Inject into the vein once.    . diclofenac (VOLTAREN) 75 MG EC tablet TAKE 1 TABLET TWICE DAILY 180 tablet 5  . gabapentin (NEURONTIN) 600 MG tablet Take 600 mg by mouth 3 (three) times daily.    Marland Kitchen lidocaine-prilocaine (EMLA) cream Apply to affected area once 30 g 3  . LORazepam (ATIVAN) 2 MG tablet Take 2 mg by mouth at bedtime.    . metoprolol (LOPRESSOR) 50 MG tablet Take 50 mg by mouth every evening.     . nitroGLYCERIN (NITROSTAT) 0.4 MG SL tablet Place 0.4 mg under the tongue every 5 (five) minutes x 3 doses as needed. For chest pain    . omeprazole (PRILOSEC OTC) 20 MG tablet Take 20 mg by mouth 2 (two) times daily. Reported on 06/07/2015    . oxyCODONE-acetaminophen (PERCOCET) 7.5-325 MG tablet Take 1 tablet by mouth every 4 (four) hours as needed. 90 tablet 0  . prochlorperazine (COMPAZINE) 10 MG tablet Take 1 tablet (10 mg total) by mouth every 6 (six) hours as needed (Nausea or vomiting). 30 tablet 1  . riTUXimab (RITUXAN IV) Inject into the vein.    . Tamsulosin HCl (FLOMAX) 0.4 MG CAPS Take 0.4 mg by mouth daily. Reported on 06/07/2015    . venlafaxine (EFFEXOR-XR) 150 MG 24 hr capsule Take 150 mg by mouth every evening.      No current facility-administered medications for this visit.      Physical Exam  Blood pressure (!) 93/59, pulse 78, height 6' (1.829 m), weight 212 lb (96.2 kg).  Constitutional: overall normal hygiene, normal nutrition, well developed, normal grooming, normal body habitus. Assistive device:cane  Musculoskeletal:  gait and station Limp left, muscle tone and strength are normal, no tremors or atrophy is present.  .  Neurological: coordination overall normal.  Deep tendon reflex/nerve stretch intact.  Sensation normal.  Cranial nerves II-XII intact.   Skin:   Normal overall no lesions, ulcers or rashes. No psoriasis. He has bilateral knee scars.  Psychiatric: Alert and oriented x 3.  Recent memory intact, remote memory unclear.  Normal mood and affect. Well groomed.  Good eye contact.  Cardiovascular: overall no swelling, no varicosities, no edema bilaterally, normal temperatures of the legs and arms, no clubbing,  cyanosis and good capillary refill.  Lymphatic: palpation is normal.  Both knees are tender, the left more than the right.  He has ROM of 0 to 105 on the right and 0 to 95 on the left.  He uses a cane.  NV intact.  All other systems reviewed and are negative   The patient has been educated about the nature of the problem(s) and counseled on treatment options.  The patient appeared to understand what I have discussed and is in agreement with it.  Encounter Diagnoses  Name Primary?  . Chronic pain of both knees Yes  . Tobacco smoker within last 12 months   . Mantle cell lymphoma of lymph nodes of multiple regions [facility])     PLAN Call if any problems.  Precautions discussed.  Continue current medications.   Return to clinic 3 months   Electronically Signed Sanjuana Kava, MD 8/13/201910:46 AM

## 2017-11-25 ENCOUNTER — Ambulatory Visit (HOSPITAL_COMMUNITY): Payer: Medicare HMO | Admitting: Hematology

## 2017-11-25 ENCOUNTER — Other Ambulatory Visit (HOSPITAL_COMMUNITY): Payer: Medicare HMO

## 2017-11-25 ENCOUNTER — Ambulatory Visit (HOSPITAL_COMMUNITY): Payer: Medicare HMO

## 2017-11-26 ENCOUNTER — Ambulatory Visit (HOSPITAL_COMMUNITY): Payer: Medicare HMO

## 2017-12-02 ENCOUNTER — Inpatient Hospital Stay (HOSPITAL_COMMUNITY): Payer: Medicare HMO | Attending: Hematology

## 2017-12-02 ENCOUNTER — Inpatient Hospital Stay (HOSPITAL_COMMUNITY): Payer: Medicare HMO | Admitting: Hematology

## 2017-12-02 ENCOUNTER — Encounter (HOSPITAL_COMMUNITY): Payer: Self-pay | Admitting: Hematology

## 2017-12-02 ENCOUNTER — Inpatient Hospital Stay (HOSPITAL_COMMUNITY): Payer: Medicare HMO

## 2017-12-02 VITALS — BP 150/78 | HR 61 | Temp 97.8°F | Resp 18 | Wt 216.2 lb

## 2017-12-02 VITALS — BP 123/68 | HR 67 | Temp 97.8°F | Resp 18

## 2017-12-02 DIAGNOSIS — I10 Essential (primary) hypertension: Secondary | ICD-10-CM | POA: Insufficient documentation

## 2017-12-02 DIAGNOSIS — Z79899 Other long term (current) drug therapy: Secondary | ICD-10-CM | POA: Diagnosis not present

## 2017-12-02 DIAGNOSIS — F17210 Nicotine dependence, cigarettes, uncomplicated: Secondary | ICD-10-CM

## 2017-12-02 DIAGNOSIS — M1990 Unspecified osteoarthritis, unspecified site: Secondary | ICD-10-CM | POA: Insufficient documentation

## 2017-12-02 DIAGNOSIS — K219 Gastro-esophageal reflux disease without esophagitis: Secondary | ICD-10-CM | POA: Insufficient documentation

## 2017-12-02 DIAGNOSIS — C8318 Mantle cell lymphoma, lymph nodes of multiple sites: Secondary | ICD-10-CM

## 2017-12-02 DIAGNOSIS — Z7982 Long term (current) use of aspirin: Secondary | ICD-10-CM | POA: Insufficient documentation

## 2017-12-02 DIAGNOSIS — E119 Type 2 diabetes mellitus without complications: Secondary | ICD-10-CM | POA: Diagnosis not present

## 2017-12-02 DIAGNOSIS — Z5111 Encounter for antineoplastic chemotherapy: Secondary | ICD-10-CM | POA: Diagnosis not present

## 2017-12-02 DIAGNOSIS — L539 Erythematous condition, unspecified: Secondary | ICD-10-CM

## 2017-12-02 DIAGNOSIS — J449 Chronic obstructive pulmonary disease, unspecified: Secondary | ICD-10-CM | POA: Insufficient documentation

## 2017-12-02 DIAGNOSIS — N400 Enlarged prostate without lower urinary tract symptoms: Secondary | ICD-10-CM | POA: Diagnosis not present

## 2017-12-02 DIAGNOSIS — F329 Major depressive disorder, single episode, unspecified: Secondary | ICD-10-CM | POA: Insufficient documentation

## 2017-12-02 DIAGNOSIS — C8316 Mantle cell lymphoma, intrapelvic lymph nodes: Secondary | ICD-10-CM

## 2017-12-02 DIAGNOSIS — F419 Anxiety disorder, unspecified: Secondary | ICD-10-CM | POA: Diagnosis not present

## 2017-12-02 LAB — CBC WITH DIFFERENTIAL/PLATELET
Basophils Absolute: 0.1 10*3/uL (ref 0.0–0.1)
Basophils Relative: 1 %
Eosinophils Absolute: 0.5 10*3/uL (ref 0.0–0.7)
Eosinophils Relative: 8 %
HEMATOCRIT: 36.2 % — AB (ref 39.0–52.0)
Hemoglobin: 11.8 g/dL — ABNORMAL LOW (ref 13.0–17.0)
LYMPHS ABS: 0.4 10*3/uL — AB (ref 0.7–4.0)
Lymphocytes Relative: 7 %
MCH: 28.7 pg (ref 26.0–34.0)
MCHC: 32.6 g/dL (ref 30.0–36.0)
MCV: 88.1 fL (ref 78.0–100.0)
MONO ABS: 0.5 10*3/uL (ref 0.1–1.0)
Monocytes Relative: 9 %
NEUTROS ABS: 4.2 10*3/uL (ref 1.7–7.7)
Neutrophils Relative %: 75 %
Platelets: 103 10*3/uL — ABNORMAL LOW (ref 150–400)
RBC: 4.11 MIL/uL — AB (ref 4.22–5.81)
RDW: 16.2 % — ABNORMAL HIGH (ref 11.5–15.5)
WBC: 5.6 10*3/uL (ref 4.0–10.5)

## 2017-12-02 LAB — COMPREHENSIVE METABOLIC PANEL
ALK PHOS: 74 U/L (ref 38–126)
ALT: 16 U/L (ref 0–44)
AST: 19 U/L (ref 15–41)
Albumin: 3.7 g/dL (ref 3.5–5.0)
Anion gap: 7 (ref 5–15)
BILIRUBIN TOTAL: 0.7 mg/dL (ref 0.3–1.2)
BUN: 12 mg/dL (ref 8–23)
CALCIUM: 8.7 mg/dL — AB (ref 8.9–10.3)
CO2: 28 mmol/L (ref 22–32)
CREATININE: 1 mg/dL (ref 0.61–1.24)
Chloride: 102 mmol/L (ref 98–111)
Glucose, Bld: 98 mg/dL (ref 70–99)
Potassium: 4.6 mmol/L (ref 3.5–5.1)
Sodium: 137 mmol/L (ref 135–145)
TOTAL PROTEIN: 6.6 g/dL (ref 6.5–8.1)

## 2017-12-02 LAB — MAGNESIUM: MAGNESIUM: 1.7 mg/dL (ref 1.7–2.4)

## 2017-12-02 MED ORDER — HEPARIN SOD (PORK) LOCK FLUSH 100 UNIT/ML IV SOLN
500.0000 [IU] | Freq: Once | INTRAVENOUS | Status: AC | PRN
  Administered 2017-12-02: 500 [IU]

## 2017-12-02 MED ORDER — FAMOTIDINE IN NACL 20-0.9 MG/50ML-% IV SOLN
20.0000 mg | Freq: Two times a day (BID) | INTRAVENOUS | Status: DC
  Administered 2017-12-02: 20 mg via INTRAVENOUS

## 2017-12-02 MED ORDER — SODIUM CHLORIDE 0.9% FLUSH
10.0000 mL | INTRAVENOUS | Status: DC | PRN
  Administered 2017-12-02: 10 mL
  Filled 2017-12-02: qty 10

## 2017-12-02 MED ORDER — FAMOTIDINE IN NACL 20-0.9 MG/50ML-% IV SOLN
INTRAVENOUS | Status: AC
  Filled 2017-12-02: qty 50

## 2017-12-02 MED ORDER — DIPHENHYDRAMINE HCL 50 MG/ML IJ SOLN
INTRAMUSCULAR | Status: AC
  Filled 2017-12-02: qty 1

## 2017-12-02 MED ORDER — SODIUM CHLORIDE 0.9 % IV SOLN
Freq: Once | INTRAVENOUS | Status: AC
  Administered 2017-12-02: 10:00:00 via INTRAVENOUS

## 2017-12-02 MED ORDER — SODIUM CHLORIDE 0.9 % IV SOLN
90.0000 mg/m2 | Freq: Once | INTRAVENOUS | Status: AC
  Administered 2017-12-02: 200 mg via INTRAVENOUS
  Filled 2017-12-02: qty 8

## 2017-12-02 MED ORDER — PALONOSETRON HCL INJECTION 0.25 MG/5ML
0.2500 mg | Freq: Once | INTRAVENOUS | Status: AC
  Administered 2017-12-02: 0.25 mg via INTRAVENOUS

## 2017-12-02 MED ORDER — DIPHENHYDRAMINE HCL 50 MG/ML IJ SOLN
50.0000 mg | Freq: Once | INTRAMUSCULAR | Status: AC
  Administered 2017-12-02: 50 mg via INTRAVENOUS

## 2017-12-02 MED ORDER — PALONOSETRON HCL INJECTION 0.25 MG/5ML
INTRAVENOUS | Status: AC
  Filled 2017-12-02: qty 5

## 2017-12-02 MED ORDER — SODIUM CHLORIDE 0.9 % IV SOLN
375.0000 mg/m2 | Freq: Once | INTRAVENOUS | Status: AC
  Administered 2017-12-02: 800 mg via INTRAVENOUS
  Filled 2017-12-02: qty 30

## 2017-12-02 MED ORDER — SODIUM CHLORIDE 0.9 % IV SOLN
10.0000 mg | Freq: Once | INTRAVENOUS | Status: AC
  Administered 2017-12-02: 10 mg via INTRAVENOUS
  Filled 2017-12-02: qty 1

## 2017-12-02 MED ORDER — ACETAMINOPHEN 325 MG PO TABS
ORAL_TABLET | ORAL | Status: AC
  Filled 2017-12-02: qty 2

## 2017-12-02 MED ORDER — ACETAMINOPHEN 325 MG PO TABS
650.0000 mg | ORAL_TABLET | Freq: Once | ORAL | Status: AC
  Administered 2017-12-02: 650 mg via ORAL

## 2017-12-02 NOTE — Progress Notes (Signed)
Patient seen by oncologist with lab review and ok to treat today verbal order Dr. Delton Coombes.   Patient tolerated chemotherapy with no complaints voiced.  Good blood return noted before and after administration of therapy.  No bruising or swelling noted at site.  Band aid applied.  VSS with discharge and left with wife with no s/s of distress noted.

## 2017-12-02 NOTE — Assessment & Plan Note (Signed)
1.  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 -Colonoscopy on 10/07/2017 with 5 mm polyp in transverse colon resected positive for lymphoma, ileocecal valve mucosal ulceration biopsied positive for lymphoma - PET CT scan dated 10/28/2017 shows diffuse involvement of the lymph nodes in the neck, chest, retroperitoneum and inguinal regions. -MRI of the brain dated 10/24/2017 shows no parenchymal brain lesions, but has positive bilateral intraorbital masses, right side measuring 3 cm in the left side measuring 1.2 cm. -Bone marrow biopsy on 10/17/2017+ for involvement by mantle cell lymphoma. - Echocardiogram on 10/18/2017 shows ejection fraction of 55 to 60%. -I had a prolonged discussion with the patient and his wife about the reports of the PET scan, MRI of the brain, bone marrow biopsy, blood work and echocardiogram.  I have recommended conventional chemoimmunotherapy followed by autologous HCT and maintenance rituximab.  I have told him that he has an incurable disease but we can induce complete remission by chemo immunotherapy.  I have recommended bendamustine and rituximab based on its acceptable toxicity profile, outcomes.  I do not think he is a candidate for hyper CVAD.  If he has at least a partial response to induction with this regimen, consolidation with autologous HCT is a good option. - Cycle 1 of bendamustine and rituximab on 11/04/2017.  He had mild nausea and moderate fatigue associated with it.  Otherwise he tolerated it very well.  Today I have reviewed his blood counts which are  adequate to proceed with cycle 2 without any dose modifications. -I plan to repeat scans to evaluate response after cycle 3.  2.  Right eye conjunctival erythema: -He has this erythema of the right eye for the last few weeks.  He has been using saline drops. This is from orbital lymphoma.  The redness has improved after first cycle.

## 2017-12-02 NOTE — Patient Instructions (Signed)
[facility] Discharge Instructions for Patients Receiving Chemotherapy  Today you received the following chemotherapy agents rituxan and bendamustine.    If you develop nausea and vomiting that is not controlled by your nausea medication, call the clinic.   BELOW ARE SYMPTOMS THAT SHOULD BE REPORTED IMMEDIATELY:  *FEVER GREATER THAN 100.5 F  *CHILLS WITH OR WITHOUT FEVER  NAUSEA AND VOMITING THAT IS NOT CONTROLLED WITH YOUR NAUSEA MEDICATION  *UNUSUAL SHORTNESS OF BREATH  *UNUSUAL BRUISING OR BLEEDING  TENDERNESS IN MOUTH AND THROAT WITH OR WITHOUT PRESENCE OF ULCERS  *URINARY PROBLEMS  *BOWEL PROBLEMS  UNUSUAL RASH Items with * indicate a potential emergency and should be followed up as soon as possible.  Feel free to call the clinic should you have any questions or concerns. The clinic phone number is [phone].  Please show the [facility] at check-in to the Emergency Department and triage nurse.

## 2017-12-02 NOTE — Patient Instructions (Signed)
[facility] at [facility] Discharge Instructions  Follow up in 4 weeks with labs a day prior,    Thank you for choosing [facility] at [facility] to provide your oncology and hematology care.  To afford each patient quality time with our provider, please arrive at least 15 minutes before your scheduled appointment time.   If you have a lab appointment with the [facility] please come in thru the  Main Entrance and check in at the main information desk  You need to re-schedule your appointment should you arrive 10 or more minutes late.  We strive to give you quality time with our providers, and arriving late affects you and other patients whose appointments are after yours.  Also, if you no show three or more times for appointments you may be dismissed from the clinic at the providers discretion.     Again, thank you for choosing [facility].  Our hope is that these requests will decrease the amount of time that you wait before being seen by our physicians.       _____________________________________________________________  Should you have questions after your visit to [facility], please contact our office at [phone] between the hours of 8:00 a.m. and 4:30 p.m.  Voicemails left after 4:00 p.m. will not be returned until the following business day.  For prescription refill requests, have your pharmacy contact our office and allow 72 hours.    Cancer Center Support Programs:   > Cancer Support Group  2nd Tuesday of the month 1pm-2pm, Journey Room

## 2017-12-02 NOTE — Progress Notes (Signed)
[place], [place] [address]   CLINIC:  Medical Oncology/Hematology  PCP:  Celene Squibb, MD [address] [place] [address] [phone]   REASON FOR VISIT:  Follow-up for mantle cell lymphoma  CURRENT THERAPY: Rituximab and Bendamustine   BRIEF ONCOLOGIC HISTORY:    Lymphoma, mantle cell ([place])   10/17/2017 Initial Diagnosis    Mantle cell lymphoma ([place])    10/31/2017 -  Chemotherapy    The patient had palonosetron (ALOXI) injection 0.25 mg, 0.25 mg, Intravenous,  Once, 2 of 4 cycles Administration: 0.25 mg (11/04/2017) pegfilgrastim (NEULASTA ONPRO KIT) injection 6 mg, 6 mg, Subcutaneous, Once, 2 of 4 cycles Administration: 6 mg (11/05/2017) riTUXimab (RITUXAN) 800 mg in sodium chloride 0.9 % 250 mL (2.4242 mg/mL) infusion, 375 mg/m2 = 800 mg, Intravenous,  Once, 2 of 4 cycles Administration: 800 mg (11/04/2017) bendamustine (BENDEKA) 200 mg in sodium chloride 0.9 % 50 mL (3.4483 mg/mL) chemo infusion, 90 mg/m2 = 200 mg, Intravenous,  Once, 2 of 4 cycles Administration: 200 mg (11/04/2017), 200 mg (11/05/2017)  for chemotherapy treatment.       INTERVAL HISTORY:  Chris Lopez 72 y.o. male returns for routine follow-up for mantle cell lymphoma. Patient is doing well with treatment so far. Patient did experience some fatigue with his last treatment. He had nausea and vomiting which was new and resolved with nausea medication prescribed. He has constant lower back pain and lower left leg pain. He is seeing primary doctor for management of this pain. He reports his appetite and energy level are at 50%. He experienced a rash on his right arm and right thigh. It has faded and does not itch or bother him. He is maintaining his weight at this time. Overall he feels ready for his next cycle of chemotherapy.    REVIEW OF SYSTEMS:  Review of Systems  Constitutional: Positive for fatigue.  Respiratory: Positive for shortness of breath.     Gastrointestinal: Positive for nausea and vomiting.  Neurological: Positive for numbness.  Hematological: Bruises/bleeds easily.  All other systems reviewed and are negative.    PAST MEDICAL/SURGICAL HISTORY:  Past Medical History:  Diagnosis Date  . Anxiety   . Arthritis   . BPH (benign prostatic hyperplasia)   . Chronic bronchitis   . COPD (chronic obstructive pulmonary disease) ([place])   . Depression   . Diabetes mellitus without complication ([place])    Resolved; not needing medications at this time.  Marland Kitchen GERD (gastroesophageal reflux disease)   . Hypertension   . Lymphoma, mantle cell ([place])    Past Surgical History:  Procedure Laterality Date  . BIOPSY  10/07/2017   Procedure: BIOPSY;  Surgeon: Daneil Dolin, MD;  [facility];  Service: Endoscopy;;  gastroesophageal junction bx ileocecal valve  . carpal tunnel right hand    . CERVICAL FUSION    . COLONOSCOPY WITH PROPOFOL N/A 10/07/2017   Procedure: COLONOSCOPY WITH PROPOFOL;  Surgeon: Daneil Dolin, MD;  Location: [facility];  Service: Endoscopy;  Laterality: N/A;  10:00am  . ESOPHAGOGASTRODUODENOSCOPY (EGD) WITH PROPOFOL N/A 10/07/2017   Procedure: ESOPHAGOGASTRODUODENOSCOPY (EGD) WITH PROPOFOL;  Surgeon: Daneil Dolin, MD;  [facility]: [facility];  Service: Endoscopy;  Laterality: N/A;  . IR IMAGING GUIDED PORT INSERTION  10/30/2017  . left knee replacement    . left knee revised X 3    . multiple knee arthroscopies    . POLYPECTOMY  10/07/2017  Procedure: POLYPECTOMY;  Surgeon: Daneil Dolin, MD;  Location: [facility];  Service: Endoscopy;;  colon  . right knee replacement    . tarsal tunnel repair    . THYROIDECTOMY, PARTIAL    . TONSILLECTOMY       SOCIAL HISTORY:  Social History   Socioeconomic History  . Marital status: Married    Spouse name: Jonelle Sidle  . Number of children: 4  . Years of education: Not on file  . Highest education level: Bachelor's degree (e.g., BA, AB, BS)   Occupational History  . 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 Financial resource strain: Somewhat hard  . Food insecurity:    Worry: Sometimes true    Inability: Never true  . Transportation needs:    Medical: No    Non-medical: No  Tobacco Use  . Smoking status: Current Every Day Smoker    Packs/day: 1.25    Years: 50.00    Pack years: 62.50    Types: Cigarettes  . Smokeless tobacco: Never Used  Substance and Sexual Activity  . Alcohol use: No  . Drug use: No  . Sexual activity: Yes    Partners: Female    Birth control/protection: None    Comment: spouse  Lifestyle  . Physical activity:    Days per week: 3 days    Minutes per session: 150+ min  . Stress: Very much  Relationships  . Social connections:    Talks on phone: Once a week    Gets together: Never    Attends religious service: Never    Active member of club or organization: No    Attends meetings of clubs or organizations: Never    Relationship status: Married  . Intimate partner violence:    Fear of current or ex partner: No    Emotionally abused: No    Physically abused: No    Forced sexual activity: No  Other Topics Concern  . Not on file  Social History Narrative  . Not on file    FAMILY HISTORY:  Family History  Problem Relation Age of Onset  . Colon cancer Maternal Grandmother   . Colon cancer Paternal Grandmother   . Heart disease Mother   . Arthritis Mother   . Prostate cancer Father   . Heart attack Brother   . Heart disease Maternal Aunt   . Cancer Maternal Aunt   . Heart disease Maternal Uncle   . Cancer Maternal Uncle   . Heart disease Paternal Aunt   . Heart disease Paternal Uncle   . Heart disease Brother   . Heart attack Brother   . Arthritis Brother     CURRENT MEDICATIONS:  Outpatient Encounter Medications as of 12/02/2017   Medication Sig  . albuterol (PROVENTIL HFA;VENTOLIN HFA) 108 (90 Base) MCG/ACT inhaler Inhale 2 puffs into the lungs every 6 (six) hours as needed for wheezing or shortness of breath.  . allopurinol (ZYLOPRIM) 300 MG tablet Take 1 tablet (300 mg total) by mouth daily.  Marland Kitchen amLODipine (NORVASC) 5 MG tablet Take 5 mg by mouth every evening.  Marland Kitchen aspirin 325 MG tablet Take 325 mg by mouth daily.   Marland Kitchen atorvastatin (LIPITOR) 20 MG tablet Take 20 mg by mouth every evening. Reported on 06/07/2015  . bendamustine in sodium chloride 0.9 %  50 mL Inject into the vein once.  . diclofenac (VOLTAREN) 75 MG EC tablet TAKE 1 TABLET TWICE DAILY  . gabapentin (NEURONTIN) 600 MG tablet Take 600 mg by mouth 3 (three) times daily.  Marland Kitchen lidocaine-prilocaine (EMLA) cream Apply to affected area once  . LORazepam (ATIVAN) 2 MG tablet Take 2 mg by mouth at bedtime.  . metoprolol (LOPRESSOR) 50 MG tablet Take 50 mg by mouth every evening.   . nitroGLYCERIN (NITROSTAT) 0.4 MG SL tablet Place 0.4 mg under the tongue every 5 (five) minutes x 3 doses as needed. For chest pain  . omeprazole (PRILOSEC OTC) 20 MG tablet Take 20 mg by mouth 2 (two) times daily. Reported on 06/07/2015  . oxyCODONE-acetaminophen (PERCOCET) 7.5-325 MG tablet Take 1 tablet by mouth every 4 (four) hours as needed.  . prochlorperazine (COMPAZINE) 10 MG tablet Take 1 tablet (10 mg total) by mouth every 6 (six) hours as needed (Nausea or vomiting).  . riTUXimab (RITUXAN IV) Inject into the vein.  . Tamsulosin HCl (FLOMAX) 0.4 MG CAPS Take 0.4 mg by mouth daily. Reported on 06/07/2015  . venlafaxine (EFFEXOR-XR) 150 MG 24 hr capsule Take 150 mg by mouth every evening.    No facility-administered encounter medications on file as of 12/02/2017.     ALLERGIES:  Allergies  Allergen Reactions  . Bee Venom Anaphylaxis    Allergic to bee stings not the kit  . Feldene [Piroxicam] Rash and Dermatitis     PHYSICAL EXAM:  ECOG Performance status: 1  Vitals:    12/02/17 0832  BP: (!) 150/78  Pulse: 61  Resp: 18  Temp: 97.8 F (36.6 C)  SpO2: 97%   Filed Weights   12/02/17 0832  Weight: 216 lb 3.2 oz (98.1 kg)    Physical Exam  Constitutional: He is oriented to person, place, and time. He appears well-developed and well-nourished.  Cardiovascular: Normal rate, regular rhythm and normal heart sounds.  Pulmonary/Chest: Effort normal and breath sounds normal.  Neurological: He is alert and oriented to person, place, and time.  Skin: Skin is warm and dry.     LABORATORY DATA:  I have reviewed the labs as listed.  CBC    Component Value Date/Time   WBC 5.6 12/02/2017 0804   RBC 4.11 (L) 12/02/2017 0804   HGB 11.8 (L) 12/02/2017 0804   HCT 36.2 (L) 12/02/2017 0804   PLT 103 (L) 12/02/2017 0804   MCV 88.1 12/02/2017 0804   MCH 28.7 12/02/2017 0804   MCHC 32.6 12/02/2017 0804   RDW 16.2 (H) 12/02/2017 0804   LYMPHSABS 0.4 (L) 12/02/2017 0804   MONOABS 0.5 12/02/2017 0804   EOSABS 0.5 12/02/2017 0804   BASOSABS 0.1 12/02/2017 0804   CMP Latest Ref Rng & Units 12/02/2017 11/04/2017 10/30/2017  Glucose 70 - 99 mg/dL 98 90 98  BUN 8 - 23 mg/dL 12 10 12   Creatinine 0.61 - 1.24 mg/dL 1.00 1.07 1.30(H)  Sodium 135 - 145 mmol/L 137 135 143  Potassium 3.5 - 5.1 mmol/L 4.6 4.5 4.5  Chloride 98 - 111 mmol/L 102 100 107  CO2 22 - 32 mmol/L 28 28 28   Calcium 8.9 - 10.3 mg/dL 8.7(L) 8.4(L) 9.2  Total Protein 6.5 - 8.1 g/dL 6.6 6.1(L) -  Total Bilirubin 0.3 - 1.2 mg/dL 0.7 0.7 -  Alkaline Phos 38 - 126 U/L 74 78 -  AST 15 - 41 U/L 19 16 -  ALT 0 - 44 U/L 16 14 -  ASSESSMENT & PLAN:   Lymphoma, mantle cell ([facility]) 1.  Stage IVb mantle cell lymphoma, sox 11+: - Night sweats once every 2 to 3 weeks for the past 2 years, no recent weight loss, 88 pound weight loss over 6 months 2 years ago, right-sided abdominal pain for the last 4 to 6 months, with severe fatigue -EGD on 10/07/2017 showing normal esophagus, nodularity with  ulceration at GE junction on gastric side in the cardia, biopsy of which consistent with mantle cell lymphoma, positive for CD20, CD79a, CD5 and cyclin D1. -Colonoscopy on 10/07/2017 with 5 mm polyp in transverse colon resected positive for lymphoma, ileocecal valve mucosal ulceration biopsied positive for lymphoma - PET CT scan dated 10/28/2017 shows diffuse involvement of the lymph nodes in the neck, chest, retroperitoneum and inguinal regions. -MRI of the brain dated 10/24/2017 shows no parenchymal brain lesions, but has positive bilateral intraorbital masses, right side measuring 3 cm in the left side measuring 1.2 cm. -Bone marrow biopsy on 10/17/2017+ for involvement by mantle cell lymphoma. - Echocardiogram on 10/18/2017 shows ejection fraction of 55 to 60%. -I had a prolonged discussion with the patient and his wife about the reports of the PET scan, MRI of the brain, bone marrow biopsy, blood work and echocardiogram.  I have recommended conventional chemoimmunotherapy followed by autologous HCT and maintenance rituximab.  I have told him that he has an incurable disease but we can induce complete remission by chemo immunotherapy.  I have recommended bendamustine and rituximab based on its acceptable toxicity profile, outcomes.  I do not think he is a candidate for hyper CVAD.  If he has at least a partial response to induction with this regimen, consolidation with autologous HCT is a good option. - Cycle 1 of bendamustine and rituximab on 11/04/2017.  He had mild nausea and moderate fatigue associated with it.  Otherwise he tolerated it very well.  Today I have reviewed his blood counts which are adequate to proceed with cycle 2 without any dose modifications. -I plan to repeat scans to evaluate response after cycle 3.  2.  Right eye conjunctival erythema: -He has this erythema of the right eye for the last few weeks.  He has been using saline drops. This is from orbital lymphoma.  The redness has  improved after first cycle.      Orders placed this encounter:  Orders Placed This Encounter  Procedures  . Magnesium  . CBC with Differential/Platelet  . Comprehensive metabolic panel  . Lactate dehydrogenase  . Kappa/lambda light chains  . Uric acid      Derek Jack, MD [place] [phone]

## 2017-12-03 ENCOUNTER — Ambulatory Visit: Payer: Medicare HMO | Admitting: Orthopaedic Surgery

## 2017-12-03 ENCOUNTER — Inpatient Hospital Stay (HOSPITAL_COMMUNITY): Payer: Medicare HMO

## 2017-12-03 VITALS — BP 121/64 | HR 58 | Temp 97.7°F | Resp 18

## 2017-12-03 DIAGNOSIS — Z5111 Encounter for antineoplastic chemotherapy: Secondary | ICD-10-CM | POA: Diagnosis not present

## 2017-12-03 DIAGNOSIS — M1990 Unspecified osteoarthritis, unspecified site: Secondary | ICD-10-CM | POA: Diagnosis not present

## 2017-12-03 DIAGNOSIS — C8318 Mantle cell lymphoma, lymph nodes of multiple sites: Secondary | ICD-10-CM

## 2017-12-03 DIAGNOSIS — K219 Gastro-esophageal reflux disease without esophagitis: Secondary | ICD-10-CM | POA: Diagnosis not present

## 2017-12-03 DIAGNOSIS — E119 Type 2 diabetes mellitus without complications: Secondary | ICD-10-CM | POA: Diagnosis not present

## 2017-12-03 DIAGNOSIS — F17210 Nicotine dependence, cigarettes, uncomplicated: Secondary | ICD-10-CM | POA: Diagnosis not present

## 2017-12-03 DIAGNOSIS — I10 Essential (primary) hypertension: Secondary | ICD-10-CM | POA: Diagnosis not present

## 2017-12-03 DIAGNOSIS — Z79899 Other long term (current) drug therapy: Secondary | ICD-10-CM | POA: Diagnosis not present

## 2017-12-03 DIAGNOSIS — L539 Erythematous condition, unspecified: Secondary | ICD-10-CM | POA: Diagnosis not present

## 2017-12-03 MED ORDER — SODIUM CHLORIDE 0.9 % IV SOLN
INTRAVENOUS | Status: DC
  Administered 2017-12-03: 11:00:00 via INTRAVENOUS

## 2017-12-03 MED ORDER — PEGFILGRASTIM 6 MG/0.6ML ~~LOC~~ PSKT
6.0000 mg | PREFILLED_SYRINGE | Freq: Once | SUBCUTANEOUS | Status: AC
  Administered 2017-12-03: 6 mg via SUBCUTANEOUS
  Filled 2017-12-03: qty 0.6

## 2017-12-03 MED ORDER — SODIUM CHLORIDE 0.9 % IV SOLN
90.0000 mg/m2 | Freq: Once | INTRAVENOUS | Status: AC
  Administered 2017-12-03: 200 mg via INTRAVENOUS
  Filled 2017-12-03: qty 8

## 2017-12-03 MED ORDER — HEPARIN SOD (PORK) LOCK FLUSH 100 UNIT/ML IV SOLN
500.0000 [IU] | Freq: Once | INTRAVENOUS | Status: AC | PRN
  Administered 2017-12-03: 500 [IU]
  Filled 2017-12-03: qty 5

## 2017-12-03 MED ORDER — SODIUM CHLORIDE 0.9 % IV SOLN
10.0000 mg | Freq: Once | INTRAVENOUS | Status: AC
  Administered 2017-12-03: 10 mg via INTRAVENOUS
  Filled 2017-12-03: qty 1

## 2017-12-03 NOTE — Progress Notes (Signed)
Tolerated infusion w/o adverse reaction.  Alert, in no distress.  VSS.  Discharged via wheelchair in c/o spouse.  

## 2017-12-04 ENCOUNTER — Other Ambulatory Visit: Payer: Self-pay

## 2017-12-04 MED ORDER — OXYCODONE-ACETAMINOPHEN 7.5-325 MG PO TABS: 1.0000 | ORAL_TABLET | ORAL | 0 refills | Status: DC | PRN

## 2017-12-25 ENCOUNTER — Other Ambulatory Visit: Payer: Self-pay

## 2017-12-25 MED ORDER — OXYCODONE-ACETAMINOPHEN 7.5-325 MG PO TABS: 1.0000 | ORAL_TABLET | ORAL | 0 refills | Status: DC | PRN

## 2017-12-30 ENCOUNTER — Inpatient Hospital Stay (HOSPITAL_COMMUNITY): Payer: Medicare HMO | Attending: Hematology

## 2017-12-30 ENCOUNTER — Encounter (HOSPITAL_COMMUNITY): Payer: Self-pay

## 2017-12-30 ENCOUNTER — Inpatient Hospital Stay (HOSPITAL_COMMUNITY): Payer: Medicare HMO | Admitting: Internal Medicine

## 2017-12-30 ENCOUNTER — Inpatient Hospital Stay (HOSPITAL_COMMUNITY): Payer: Medicare HMO

## 2017-12-30 VITALS — BP 123/66 | HR 70 | Temp 97.9°F | Resp 18 | Wt 217.2 lb

## 2017-12-30 DIAGNOSIS — I2510 Atherosclerotic heart disease of native coronary artery without angina pectoris: Secondary | ICD-10-CM | POA: Diagnosis not present

## 2017-12-30 DIAGNOSIS — C8318 Mantle cell lymphoma, lymph nodes of multiple sites: Secondary | ICD-10-CM

## 2017-12-30 DIAGNOSIS — D123 Benign neoplasm of transverse colon: Secondary | ICD-10-CM | POA: Diagnosis not present

## 2017-12-30 DIAGNOSIS — N400 Enlarged prostate without lower urinary tract symptoms: Secondary | ICD-10-CM | POA: Diagnosis not present

## 2017-12-30 DIAGNOSIS — Z5111 Encounter for antineoplastic chemotherapy: Secondary | ICD-10-CM | POA: Insufficient documentation

## 2017-12-30 DIAGNOSIS — Z7982 Long term (current) use of aspirin: Secondary | ICD-10-CM | POA: Insufficient documentation

## 2017-12-30 DIAGNOSIS — E119 Type 2 diabetes mellitus without complications: Secondary | ICD-10-CM | POA: Diagnosis not present

## 2017-12-30 DIAGNOSIS — Z7689 Persons encountering health services in other specified circumstances: Secondary | ICD-10-CM | POA: Insufficient documentation

## 2017-12-30 DIAGNOSIS — F419 Anxiety disorder, unspecified: Secondary | ICD-10-CM | POA: Diagnosis not present

## 2017-12-30 DIAGNOSIS — I10 Essential (primary) hypertension: Secondary | ICD-10-CM | POA: Insufficient documentation

## 2017-12-30 DIAGNOSIS — F329 Major depressive disorder, single episode, unspecified: Secondary | ICD-10-CM | POA: Insufficient documentation

## 2017-12-30 DIAGNOSIS — J449 Chronic obstructive pulmonary disease, unspecified: Secondary | ICD-10-CM | POA: Insufficient documentation

## 2017-12-30 DIAGNOSIS — F17210 Nicotine dependence, cigarettes, uncomplicated: Secondary | ICD-10-CM

## 2017-12-30 DIAGNOSIS — E669 Obesity, unspecified: Secondary | ICD-10-CM | POA: Insufficient documentation

## 2017-12-30 DIAGNOSIS — K219 Gastro-esophageal reflux disease without esophagitis: Secondary | ICD-10-CM | POA: Insufficient documentation

## 2017-12-30 DIAGNOSIS — Z79899 Other long term (current) drug therapy: Secondary | ICD-10-CM | POA: Diagnosis not present

## 2017-12-30 DIAGNOSIS — E782 Mixed hyperlipidemia: Secondary | ICD-10-CM | POA: Insufficient documentation

## 2017-12-30 DIAGNOSIS — M1990 Unspecified osteoarthritis, unspecified site: Secondary | ICD-10-CM | POA: Diagnosis not present

## 2017-12-30 LAB — COMPREHENSIVE METABOLIC PANEL
ALK PHOS: 76 U/L (ref 38–126)
ALT: 15 U/L (ref 0–44)
AST: 18 U/L (ref 15–41)
Albumin: 3.6 g/dL (ref 3.5–5.0)
Anion gap: 7 (ref 5–15)
BILIRUBIN TOTAL: 0.5 mg/dL (ref 0.3–1.2)
BUN: 13 mg/dL (ref 8–23)
CALCIUM: 8.8 mg/dL — AB (ref 8.9–10.3)
CO2: 28 mmol/L (ref 22–32)
CREATININE: 1.21 mg/dL (ref 0.61–1.24)
Chloride: 105 mmol/L (ref 98–111)
GFR calc Af Amer: >60 mL/min (ref >60)
GFR, EST NON AFRICAN AMERICAN: 58 mL/min — AB (ref >60)
Glucose, Bld: 111 mg/dL — ABNORMAL HIGH (ref 70–99)
POTASSIUM: 5 mmol/L (ref 3.5–5.1)
Sodium: 140 mmol/L (ref 135–145)
TOTAL PROTEIN: 6.3 g/dL — AB (ref 6.5–8.1)

## 2017-12-30 LAB — CBC WITH DIFFERENTIAL/PLATELET
Basophils Absolute: 0.1 10*3/uL (ref 0.0–0.1)
Basophils Relative: 1 %
Eosinophils Absolute: 0.6 10*3/uL (ref 0.0–0.7)
Eosinophils Relative: 9 %
HEMATOCRIT: 37 % — AB (ref 39.0–52.0)
Hemoglobin: 12.2 g/dL — ABNORMAL LOW (ref 13.0–17.0)
LYMPHS PCT: 6 %
Lymphs Abs: 0.4 10*3/uL — ABNORMAL LOW (ref 0.7–4.0)
MCH: 29.8 pg (ref 26.0–34.0)
MCHC: 33 g/dL (ref 30.0–36.0)
MCV: 90.5 fL (ref 78.0–100.0)
MONOS PCT: 13 %
Monocytes Absolute: 0.9 10*3/uL (ref 0.1–1.0)
NEUTROS ABS: 4.5 10*3/uL (ref 1.7–7.7)
NEUTROS PCT: 71 %
Platelets: 142 10*3/uL — ABNORMAL LOW (ref 150–400)
RBC: 4.09 MIL/uL — ABNORMAL LOW (ref 4.22–5.81)
RDW: 16.8 % — ABNORMAL HIGH (ref 11.5–15.5)
WBC: 6.5 10*3/uL (ref 4.0–10.5)

## 2017-12-30 LAB — LACTATE DEHYDROGENASE: LDH: 111 U/L (ref 98–192)

## 2017-12-30 LAB — URIC ACID: Uric Acid, Serum: 5 mg/dL (ref 3.7–8.6)

## 2017-12-30 LAB — MAGNESIUM: Magnesium: 1.6 mg/dL — ABNORMAL LOW (ref 1.7–2.4)

## 2017-12-30 MED ORDER — SODIUM CHLORIDE 0.9 % IV SOLN
90.0000 mg/m2 | Freq: Once | INTRAVENOUS | Status: AC
  Administered 2017-12-30: 200 mg via INTRAVENOUS
  Filled 2017-12-30: qty 8

## 2017-12-30 MED ORDER — SODIUM CHLORIDE 0.9 % IV SOLN
375.0000 mg/m2 | Freq: Once | INTRAVENOUS | Status: AC
  Administered 2017-12-30: 800 mg via INTRAVENOUS
  Filled 2017-12-30: qty 30

## 2017-12-30 MED ORDER — OCTREOTIDE ACETATE 30 MG IM KIT
PACK | INTRAMUSCULAR | Status: AC
  Filled 2017-12-30: qty 1

## 2017-12-30 MED ORDER — PALONOSETRON HCL INJECTION 0.25 MG/5ML
0.2500 mg | Freq: Once | INTRAVENOUS | Status: AC
  Administered 2017-12-30: 0.25 mg via INTRAVENOUS
  Filled 2017-12-30: qty 5

## 2017-12-30 MED ORDER — SODIUM CHLORIDE 0.9 % IV SOLN
10.0000 mg | Freq: Once | INTRAVENOUS | Status: AC
  Administered 2017-12-30: 10 mg via INTRAVENOUS
  Filled 2017-12-30: qty 1

## 2017-12-30 MED ORDER — FAMOTIDINE IN NACL 20-0.9 MG/50ML-% IV SOLN
20.0000 mg | Freq: Two times a day (BID) | INTRAVENOUS | Status: DC
  Administered 2017-12-30: 20 mg via INTRAVENOUS
  Filled 2017-12-30: qty 50

## 2017-12-30 MED ORDER — DIPHENHYDRAMINE HCL 50 MG/ML IJ SOLN
50.0000 mg | Freq: Once | INTRAMUSCULAR | Status: AC
  Administered 2017-12-30: 50 mg via INTRAVENOUS
  Filled 2017-12-30: qty 1

## 2017-12-30 MED ORDER — DIPHENHYDRAMINE HCL 25 MG PO TABS: 25.0000 mg | ORAL_TABLET | Freq: Four times a day (QID) | ORAL | 0 refills | Status: AC | PRN

## 2017-12-30 MED ORDER — HEPARIN SOD (PORK) LOCK FLUSH 100 UNIT/ML IV SOLN
500.0000 [IU] | Freq: Once | INTRAVENOUS | Status: AC | PRN
  Administered 2017-12-30: 500 [IU]
  Filled 2017-12-30: qty 5

## 2017-12-30 MED ORDER — SODIUM CHLORIDE 0.9% FLUSH
10.0000 mL | INTRAVENOUS | Status: DC | PRN
  Administered 2017-12-30: 10 mL
  Filled 2017-12-30: qty 10

## 2017-12-30 MED ORDER — ACETAMINOPHEN 325 MG PO TABS
650.0000 mg | ORAL_TABLET | Freq: Once | ORAL | Status: AC
  Administered 2017-12-30: 650 mg via ORAL
  Filled 2017-12-30: qty 2

## 2017-12-30 MED ORDER — SODIUM CHLORIDE 0.9 % IV SOLN
Freq: Once | INTRAVENOUS | Status: AC
  Administered 2017-12-30: 10:00:00 via INTRAVENOUS

## 2017-12-30 NOTE — Progress Notes (Signed)
Diagnosis No diagnosis found.  Staging Cancer Staging No matching staging information was found for the patient.  Assessment and Plan:  Lymphoma, mantle cell (HCC) 1.  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 -Colonoscopy on 10/07/2017 with 5 mm polyp in transverse colon resected positive for lymphoma, ileocecal valve mucosal ulceration biopsied positive for lymphoma - PET CT scan dated 10/28/2017 shows diffuse involvement of the lymph nodes in the neck, chest, retroperitoneum and inguinal regions. -MRI of the brain dated 10/24/2017 shows no parenchymal brain lesions, but has positive bilateral intraorbital masses, right side measuring 3 cm in the left side measuring 1.2 cm. -Bone marrow biopsy on 10/17/2017+ for involvement by mantle cell lymphoma. - Echocardiogram on 10/18/2017 shows ejection fraction of 55 to 60%. -Dr. Redge Gainer had discussion with pt regarding chemoimmunotherapy followed by autologous HCT and maintenance rituximab.  Pt is being treated with bendamustine and rituximab and is here for evaluation prior to C3 of therapy.  Labs done 12/30/2017 reviewed and showed WBC 6.5 HB 12.2 plts 142,000.  Chemistries WNL with K+ 5, cr 1.21 and normal LFTs.  He will proceed with therapy.  Pt set up for PET scan in 3-4 weeks prior to C4.  He will follow-up with Dr. Worthy Keeler to go over results.    2.  Itching.  Pt reports symptoms respond to benadryl.  Rx for Benadryl sent to pharmacy per pt request.  Pt also encouraged to use mild moisturizer.    3.  Right eye conjunctival  erythema.  Continue using saline drops. This is from orbital lymphoma.  The redness has improved after first cycle.  30 minutes spent with more than 50% spent in counseling and coordination of care.    Interval History.  Historical data obtained from note dated 12/02/2017:  Stage IVb mantle cell lymphoma, sox 11+: - Night sweats once every 2 to 3 weeks for the past 2 years, no recent weight loss, 88 pound weight loss over 6 months 2 years ago, right-sided abdominal pain for the last 4 to 6 months, with severe fatigue -EGD on 10/07/2017 showing normal esophagus, nodularity with ulceration at GE junction on gastric side in the cardia, biopsy of which consistent with mantle cell lymphoma, positive for CD20, CD79a, CD5 and cyclin D1. -Colonoscopy on 10/07/2017 with 5 mm polyp in transverse colon resected positive for lymphoma, ileocecal valve mucosal ulceration biopsied positive for lymphoma - PET CT scan dated 10/28/2017 shows diffuse involvement of the lymph nodes in the neck, chest, retroperitoneum and inguinal regions. -MRI of the brain dated 10/24/2017 shows no parenchymal brain lesions, but has positive bilateral intraorbital masses, right side measuring 3 cm in the left side measuring 1.2 cm. -Bone marrow biopsy on 10/17/2017+ for involvement by mantle cell lymphoma. - Echocardiogram on 10/18/2017 shows ejection fraction of 55 to 60%.  Current Status:  Pt seen today for follow-up prior to C3 of BR.  He reports itching on arms that responds to OTC benadryl.  He desires Rx.       Lymphoma, mantle cell ([place])   10/17/2017 Initial Diagnosis  Mantle cell lymphoma ([place])    10/31/2017 -  Chemotherapy    The patient had palonosetron (ALOXI) injection 0.25 mg, 0.25 mg, Intravenous,  Once, 3 of 4 cycles Administration: 0.25 mg (11/04/2017), 0.25 mg (12/02/2017), 0.25 mg (12/30/2017) pegfilgrastim (NEULASTA ONPRO KIT) injection 6 mg, 6 mg, Subcutaneous, Once, 3 of 4 cycles Administration: 6 mg (11/05/2017), 6 mg  (12/03/2017) riTUXimab (RITUXAN) 800 mg in sodium chloride 0.9 % 250 mL (2.4242 mg/mL) infusion, 375 mg/m2 = 800 mg, Intravenous,  Once, 3 of 4 cycles Administration: 800 mg (11/04/2017), 800 mg (12/02/2017), 800 mg (12/30/2017) bendamustine (BENDEKA) 200 mg in sodium chloride 0.9 % 50 mL (3.4483 mg/mL) chemo infusion, 90 mg/m2 = 200 mg, Intravenous,  Once, 3 of 4 cycles Administration: 200 mg (11/04/2017), 200 mg (11/05/2017), 200 mg (12/02/2017), 200 mg (12/30/2017), 200 mg (12/03/2017)  for chemotherapy treatment.       Problem List Patient Active Problem List   Diagnosis Date Noted  . Lymphoma, mantle cell ([place]) [C83.10] 10/17/2017  . GERD (gastroesophageal reflux disease) [K21.9] 07/24/2017  . Constipation [K59.00] 07/24/2017  . RLQ abdominal pain [R10.31] 07/24/2017  . RUQ pain [R10.11] 07/24/2017  . Type II or unspecified type diabetes mellitus without mention of complication, not stated as uncontrolled [E11.9] 10/13/2012  . Mixed hyperlipidemia [E78.2] 10/13/2012  . Depressive disorder, not elsewhere classified [F32.9] 10/13/2012  . Cramp of limb [R25.2] 10/13/2012  . Pain in joint, lower leg [M25.569] 10/13/2012  . Other chest pain [R07.89] 10/13/2012  . LUQ abdominal pain [R10.12] 07/24/2010  . Encounter for colorectal cancer screening [Z12.11, Z12.12] 07/24/2010  . OBESITY [E66.9] 05/03/2009  . TOBACCO USER [F17.200] 05/03/2009  . HYPERTENSION [I10] 05/03/2009  . CAD, NATIVE VESSEL [I25.10] 05/03/2009  . ANGINA, HX OF [Z86.79] 05/03/2009    Past Medical History Past Medical History:  Diagnosis Date  . Anxiety   . Arthritis   . BPH (benign prostatic hyperplasia)   . Chronic bronchitis   . COPD (chronic obstructive pulmonary disease) ([place])   . Depression   . Diabetes mellitus without complication ([place])    Resolved; not needing medications at this time.  Marland Kitchen GERD (gastroesophageal reflux disease)   . Hypertension   . Lymphoma, mantle cell ([place])     Past Surgical  History Past Surgical History:  Procedure Laterality Date  . BIOPSY  10/07/2017   Procedure: BIOPSY;  Surgeon: Daneil Dolin, MD;  [facility];  Service: Endoscopy;;  gastroesophageal junction bx ileocecal valve  . carpal tunnel right hand    . CERVICAL FUSION    . COLONOSCOPY WITH PROPOFOL N/A 10/07/2017   Procedure: COLONOSCOPY WITH PROPOFOL;  Surgeon: Daneil Dolin, MD;  [facility];  Service: Endoscopy;  Laterality: N/A;  10:00am  . ESOPHAGOGASTRODUODENOSCOPY (EGD) WITH PROPOFOL N/A 10/07/2017   Procedure: ESOPHAGOGASTRODUODENOSCOPY (EGD) WITH PROPOFOL;  Surgeon: Daneil Dolin, MD;  [facility];  Service: Endoscopy;  Laterality: N/A;  . IR IMAGING GUIDED PORT INSERTION  10/30/2017  . left knee replacement    . left knee revised X 3    . multiple knee arthroscopies    . POLYPECTOMY  10/07/2017   Procedure: POLYPECTOMY;  Surgeon: Daneil Dolin, MD;  [facility];  Service: Endoscopy;;  colon  . right knee replacement    . tarsal tunnel repair    . THYROIDECTOMY, PARTIAL    . TONSILLECTOMY      Family History Family History  Problem Relation Age of Onset  .  Colon cancer Maternal Grandmother   . Colon cancer Paternal Grandmother   . Heart disease Mother   . Arthritis Mother   . Prostate cancer Father   . Heart attack Brother   . Heart disease Maternal Aunt   . Cancer Maternal Aunt   . Heart disease Maternal Uncle   . Cancer Maternal Uncle   . Heart disease Paternal Aunt   . Heart disease Paternal Uncle   . Heart disease Brother   . Heart attack Brother   . Arthritis Brother      Social History  reports that he has been smoking cigarettes. He has a 62.50 pack-year smoking history. He has never used smokeless tobacco. He reports that he does not drink alcohol or use drugs.  Medications  Current Outpatient Medications:  .  albuterol (PROVENTIL HFA;VENTOLIN HFA) 108 (90 Base) MCG/ACT inhaler, Inhale 2 puffs into the lungs  every 6 (six) hours as needed for wheezing or shortness of breath., Disp: , Rfl:  .  allopurinol (ZYLOPRIM) 300 MG tablet, Take 1 tablet (300 mg total) by mouth daily., Disp: 30 tablet, Rfl: 3 .  amLODipine (NORVASC) 5 MG tablet, Take 5 mg by mouth every evening., Disp: , Rfl:  .  amLODipine (NORVASC) 5 MG tablet, , Disp: , Rfl:  .  aspirin 325 MG tablet, Take 325 mg by mouth daily. , Disp: , Rfl:  .  atorvastatin (LIPITOR) 20 MG tablet, Take 20 mg by mouth every evening. Reported on 06/07/2015, Disp: , Rfl:  .  bendamustine in sodium chloride 0.9 % 50 mL, Inject into the vein once., Disp: , Rfl:  .  diclofenac (VOLTAREN) 75 MG EC tablet, TAKE 1 TABLET TWICE DAILY, Disp: 180 tablet, Rfl: 5 .  gabapentin (NEURONTIN) 600 MG tablet, Take 600 mg by mouth 3 (three) times daily., Disp: , Rfl:  .  lidocaine-prilocaine (EMLA) cream, Apply to affected area once, Disp: 30 g, Rfl: 3 .  LORazepam (ATIVAN) 2 MG tablet, Take 2 mg by mouth at bedtime., Disp: , Rfl:  .  LORazepam (ATIVAN) 2 MG tablet, , Disp: , Rfl:  .  metoprolol (LOPRESSOR) 50 MG tablet, Take 50 mg by mouth every evening. , Disp: , Rfl:  .  nitroGLYCERIN (NITROSTAT) 0.4 MG SL tablet, Place 0.4 mg under the tongue every 5 (five) minutes x 3 doses as needed. For chest pain, Disp: , Rfl:  .  omeprazole (PRILOSEC OTC) 20 MG tablet, Take 20 mg by mouth 2 (two) times daily. Reported on 06/07/2015, Disp: , Rfl:  .  oxyCODONE-acetaminophen (PERCOCET) 7.5-325 MG tablet, Take 1 tablet by mouth every 4 (four) hours as needed., Disp: 90 tablet, Rfl: 0 .  oxyCODONE-acetaminophen (PERCOCET) 7.5-325 MG tablet, Take by mouth., Disp: , Rfl:  .  prochlorperazine (COMPAZINE) 10 MG tablet, Take 1 tablet (10 mg total) by mouth every 6 (six) hours as needed (Nausea or vomiting)., Disp: 30 tablet, Rfl: 1 .  riTUXimab (RITUXAN IV), Inject into the vein., Disp: , Rfl:  .  Tamsulosin HCl (FLOMAX) 0.4 MG CAPS, Take 0.4 mg by mouth daily. Reported on 06/07/2015, Disp: , Rfl:   .  venlafaxine (EFFEXOR-XR) 150 MG 24 hr capsule, Take 150 mg by mouth every evening. , Disp: , Rfl:  .  venlafaxine XR (EFFEXOR-XR) 75 MG 24 hr capsule, , Disp: , Rfl:  No current facility-administered medications for this visit.   Facility-Administered Medications Ordered in Other Visits:  .  famotidine (PEPCID) IVPB 20 mg premix, 20 mg, Intravenous, Q12H,  Aurelie Dicenzo, Mathis Dad, MD, Stopped at 12/30/17 1027 .  sodium chloride flush (NS) 0.9 % injection 10 mL, 10 mL, Intracatheter, PRN, Lizzett Nobile, MD, 10 mL at 12/30/17 0935  Allergies Bee venom and Feldene [piroxicam]  Review of Systems Review of Systems - Oncology ROS negative other than arms itching   Physical Exam  Vitals Wt Readings from Last 3 Encounters:  12/30/17 217 lb 3.2 oz (98.5 kg)  12/02/17 216 lb 3.2 oz (98.1 kg)  11/19/17 212 lb (96.2 kg)   Temp Readings from Last 3 Encounters:  12/30/17 97.9 F (36.6 C) (Oral)  12/03/17 97.7 F (36.5 C) (Oral)  12/02/17 97.8 F (36.6 C) (Oral)   BP Readings from Last 3 Encounters:  12/30/17 123/66  12/03/17 121/64  12/02/17 (!) 150/78   Pulse Readings from Last 3 Encounters:  12/30/17 70  12/03/17 (!) 58  12/02/17 61   Constitutional: Well-developed, well-nourished, and in no distress.   HENT: Head: Normocephalic and atraumatic.  Mouth/Throat: No oropharyngeal exudate. Mucosa moist. Eyes: Pupils are equal, round, and reactive to light. Conjunctivae are normal. No scleral icterus.  Neck: Normal range of motion. Neck supple. No JVD present.  Cardiovascular: Normal rate, regular rhythm and normal heart sounds.  Exam reveals no gallop and no friction rub.   No murmur heard. Pulmonary/Chest: Effort normal and breath sounds normal. No respiratory distress. No wheezes.No rales.  Abdominal: Soft. Bowel sounds are normal. No distension. There is no tenderness. There is no guarding.  Musculoskeletal: No edema or tenderness.  Lymphadenopathy: No cervical, axillary or  supraclavicular adenopathy.  Neurological: Alert and oriented to person, place, and time. No cranial nerve deficit.  Skin: Skin is warm and dry. No rash noted. No erythema. No pallor. Dryness noted on arms.   Psychiatric: Affect and judgment normal.   Labs Appointment on 12/30/2017  Component Date Value Ref Range Status  . Magnesium 12/30/2017 1.6* 1.7 - 2.4 mg/dL Final   Performed at [facility], [address] [place], [place] [address]  . WBC 12/30/2017 6.5  4.0 - 10.5 K/uL Final  . RBC 12/30/2017 4.09* 4.22 - 5.81 MIL/uL Final  . Hemoglobin 12/30/2017 12.2* 13.0 - 17.0 g/dL Final  . HCT 12/30/2017 37.0* 39.0 - 52.0 % Final  . MCV 12/30/2017 90.5  78.0 - 100.0 fL Final  . MCH 12/30/2017 29.8  26.0 - 34.0 pg Final  . MCHC 12/30/2017 33.0  30.0 - 36.0 g/dL Final  . RDW 12/30/2017 16.8* 11.5 - 15.5 % Final  . Platelets 12/30/2017 142* 150 - 400 K/uL Final  . Neutrophils Relative % 12/30/2017 71  % Final  . Neutro Abs 12/30/2017 4.5  1.7 - 7.7 K/uL Final  . Lymphocytes Relative 12/30/2017 6  % Final  . Lymphs Abs 12/30/2017 0.4* 0.7 - 4.0 K/uL Final  . Monocytes Relative 12/30/2017 13  % Final  . Monocytes Absolute 12/30/2017 0.9  0.1 - 1.0 K/uL Final  . Eosinophils Relative 12/30/2017 9  % Final  . Eosinophils Absolute 12/30/2017 0.6  0.0 - 0.7 K/uL Final  . Basophils Relative 12/30/2017 1  % Final  . Basophils Absolute 12/30/2017 0.1  0.0 - 0.1 K/uL Final   Performed at [facility], [address] [place], [place] [address]  . Sodium 12/30/2017 140  135 - 145 mmol/L Final  . Potassium 12/30/2017 5.0  3.5 - 5.1 mmol/L Final  . Chloride 12/30/2017 105  98 - 111 mmol/L Final  . CO2 12/30/2017 28  22 - 32 mmol/L Final  .  Glucose, Bld 12/30/2017 111* 70 - 99 mg/dL Final  . BUN 12/30/2017 13  8 - 23 mg/dL Final  . Creatinine, Ser 12/30/2017 1.21  0.61 - 1.24 mg/dL Final  . Calcium 12/30/2017 8.8* 8.9 - 10.3 mg/dL Final  . Total Protein 12/30/2017 6.3* 6.5 - 8.1 g/dL Final  .  Albumin 12/30/2017 3.6  3.5 - 5.0 g/dL Final  . AST 12/30/2017 18  15 - 41 U/L Final  . ALT 12/30/2017 15  0 - 44 U/L Final  . Alkaline Phosphatase 12/30/2017 76  38 - 126 U/L Final  . Total Bilirubin 12/30/2017 0.5  0.3 - 1.2 mg/dL Final  . GFR calc non Af Amer 12/30/2017 58* >60 mL/min Final  . GFR calc Af Amer 12/30/2017 >60  >60 mL/min Final   Comment: (NOTE) The eGFR has been calculated using the CKD EPI equation. This calculation has not been validated in all clinical situations. eGFR's persistently <60 mL/min signify possible Chronic Kidney Disease.   Georgiann Hahn gap 12/30/2017 7  5 - 15 Final   Performed at [facility], [address], [place], [place] [address]  . LDH 12/30/2017 111  98 - 192 U/L Final   Performed at [facility], [address] [place], [place] [address]  . Uric Acid, Serum 12/30/2017 5.0  3.7 - 8.6 mg/dL Final   Performed at [facility], [address], [place], [place] [address]     Pathology No orders of the defined types were placed in this encounter.      Zoila Shutter MD

## 2017-12-30 NOTE — Patient Instructions (Signed)
[facility] Discharge Instructions for Patients Receiving Chemotherapy   Beginning January 23rd 2017 lab work for the Cancer Center will be done in the  Main lab at [facility] on 1st floor. If you have a lab appointment with the Cancer Center please come in thru the  Main Entrance and check in at the main information desk   Today you received the following chemotherapy agents Rituxan and Bendamustine. Follow-up as scheduled. Call clinic for any questions or concerns  To help prevent nausea and vomiting after your treatment, we encourage you to take your nausea medication    If you develop nausea and vomiting, or diarrhea that is not controlled by your medication, call the clinic.  The clinic phone number is [phone]. Office hours are Monday-Friday 8:30am-5:00pm.  BELOW ARE SYMPTOMS THAT SHOULD BE REPORTED IMMEDIATELY:  *FEVER GREATER THAN 101.0 F  *CHILLS WITH OR WITHOUT FEVER  NAUSEA AND VOMITING THAT IS NOT CONTROLLED WITH YOUR NAUSEA MEDICATION  *UNUSUAL SHORTNESS OF BREATH  *UNUSUAL BRUISING OR BLEEDING  TENDERNESS IN MOUTH AND THROAT WITH OR WITHOUT PRESENCE OF ULCERS  *URINARY PROBLEMS  *BOWEL PROBLEMS  UNUSUAL RASH Items with * indicate a potential emergency and should be followed up as soon as possible. If you have an emergency after office hours please contact your primary care physician or go to the nearest emergency department.  Please call the clinic during office hours if you have any questions or concerns.   You may also contact the Patient Navigator at [phone] should you have any questions or need assistance in obtaining follow up care.      Resources For Cancer Patients and their Caregivers ? American Cancer Society: Can assist with transportation, wigs, general needs, runs Look Good Feel Better.        1-888-227-6333 ? Cancer Care: Provides financial assistance, online support groups, medication/co-pay assistance.   1-800-813-HOPE (4673) ? [facility] Assists [place] cancer patients and their families through emotional , educational and financial support.  336-427-4357 ? [place] DSS Where to apply for food stamps, Medicaid and utility assistance. 336-342-1394 ? [facility]: Transportation to medical appointments. 336-347-2287 ? Social Security Administration: May apply for disability if have a Stage IV cancer. 336-342-7796 1-800-772-1213 ? [place] Aging, Disability and Transit Services: Assists with nutrition, care and transit needs. 336-349-2343         

## 2017-12-30 NOTE — Progress Notes (Signed)
0930 Labs reviewed with and pt seen by Dr. Walden Field today and pt approved for chemo tx today per MD                                                                      Chris Lopez tolerated chemo tx well without complaints or incident. VSS upon discharge. Pt discharged via wheelchair in satisfactory condition accompanied by family member

## 2017-12-31 ENCOUNTER — Inpatient Hospital Stay (HOSPITAL_COMMUNITY): Payer: Medicare HMO

## 2017-12-31 ENCOUNTER — Ambulatory Visit (HOSPITAL_COMMUNITY): Payer: Medicare HMO

## 2017-12-31 ENCOUNTER — Other Ambulatory Visit (HOSPITAL_COMMUNITY): Payer: Self-pay | Admitting: Nurse Practitioner

## 2017-12-31 VITALS — BP 140/77 | HR 63 | Temp 98.0°F | Resp 18

## 2017-12-31 DIAGNOSIS — D123 Benign neoplasm of transverse colon: Secondary | ICD-10-CM | POA: Diagnosis not present

## 2017-12-31 DIAGNOSIS — C8318 Mantle cell lymphoma, lymph nodes of multiple sites: Secondary | ICD-10-CM | POA: Diagnosis not present

## 2017-12-31 DIAGNOSIS — F17210 Nicotine dependence, cigarettes, uncomplicated: Secondary | ICD-10-CM | POA: Diagnosis not present

## 2017-12-31 DIAGNOSIS — Z79899 Other long term (current) drug therapy: Secondary | ICD-10-CM | POA: Diagnosis not present

## 2017-12-31 DIAGNOSIS — Z7689 Persons encountering health services in other specified circumstances: Secondary | ICD-10-CM | POA: Diagnosis not present

## 2017-12-31 DIAGNOSIS — I10 Essential (primary) hypertension: Secondary | ICD-10-CM | POA: Diagnosis not present

## 2017-12-31 DIAGNOSIS — I2510 Atherosclerotic heart disease of native coronary artery without angina pectoris: Secondary | ICD-10-CM | POA: Diagnosis not present

## 2017-12-31 DIAGNOSIS — Z5111 Encounter for antineoplastic chemotherapy: Secondary | ICD-10-CM | POA: Diagnosis not present

## 2017-12-31 DIAGNOSIS — E119 Type 2 diabetes mellitus without complications: Secondary | ICD-10-CM | POA: Diagnosis not present

## 2017-12-31 LAB — KAPPA/LAMBDA LIGHT CHAINS
KAPPA FREE LGHT CHN: 16.3 mg/L (ref 3.3–19.4)
Kappa, lambda light chain ratio: 1.07 (ref 0.26–1.65)
Lambda free light chains: 15.2 mg/L (ref 5.7–26.3)

## 2017-12-31 MED ORDER — SODIUM CHLORIDE 0.9 % IV SOLN
10.0000 mg | Freq: Once | INTRAVENOUS | Status: AC
  Administered 2017-12-31: 10 mg via INTRAVENOUS
  Filled 2017-12-31: qty 1

## 2017-12-31 MED ORDER — HEPARIN SOD (PORK) LOCK FLUSH 100 UNIT/ML IV SOLN
500.0000 [IU] | Freq: Once | INTRAVENOUS | Status: AC | PRN
  Administered 2017-12-31: 500 [IU]
  Filled 2017-12-31: qty 5

## 2017-12-31 MED ORDER — SODIUM CHLORIDE 0.9 % IV SOLN
90.0000 mg/m2 | Freq: Once | INTRAVENOUS | Status: AC
  Administered 2017-12-31: 200 mg via INTRAVENOUS
  Filled 2017-12-31: qty 8

## 2017-12-31 MED ORDER — PEGFILGRASTIM 6 MG/0.6ML ~~LOC~~ PSKT
6.0000 mg | PREFILLED_SYRINGE | Freq: Once | SUBCUTANEOUS | Status: AC
  Administered 2017-12-31: 6 mg via SUBCUTANEOUS
  Filled 2017-12-31: qty 0.6

## 2017-12-31 MED ORDER — SODIUM CHLORIDE 0.9 % IV SOLN
Freq: Once | INTRAVENOUS | Status: AC
  Administered 2017-12-31: 09:00:00 via INTRAVENOUS

## 2017-12-31 NOTE — Progress Notes (Signed)
Tolerated infusion w/o adverse reaction.  Alert, in no distress.  Discharged ambulatory in c/o spouse.  

## 2018-01-13 ENCOUNTER — Telehealth: Payer: Self-pay | Admitting: Nurse Practitioner

## 2018-01-13 ENCOUNTER — Ambulatory Visit: Payer: Medicare HMO | Admitting: Nurse Practitioner

## 2018-01-13 ENCOUNTER — Encounter: Payer: Self-pay | Admitting: Internal Medicine

## 2018-01-13 NOTE — Telephone Encounter (Signed)
PATIENT WAS A NO SHOW AND LETTER SENT  °

## 2018-01-13 NOTE — Telephone Encounter (Signed)
Noted  

## 2018-01-13 NOTE — Progress Notes (Deleted)
Referring Provider: Celene Squibb, MD Primary Care Physician:  Celene Squibb, MD Primary GI:  Dr. Gala Romney  No chief complaint on file.   HPI:   Chris Lopez is a 72 y.o. male who presents for follow-up on GERD and constipation.  The patient was last seen in our office 10/08/2017 for the same.  Last colonoscopy, per primary care, was 2017.  Known history of iron deficiency anemia.  Previous colonoscopy in our system dated 08/07/2010 which noted polyps found to be tubular adenoma.  EGD the same day found distal esophageal erosions consistent with erosive reflux esophagitis status post biopsy which is negative for H. pylori.  Recommended repeat colonoscopy in 2017.  He did undergo colonoscopy 10/07/2017 which found NSAID effect, less likely inflammatory bowel, single 5 mm polyp status post biopsy, mucosal ulceration status post biopsy, internal nonbleeding hemorrhoids.  EGD completed the same day found normal esophagus, mucosal changes in the cardia with nodularity and ulceration status post biopsy, normal duodenum.  Surgical pathology immunohistochemical stain positive for SOX11.  Atypical lymphoid infiltrate positive for SOX11.  The patient was subsequently referred to oncology.  Oncology continues to see him for mantle cell lymphoma, stage IVb.  PET scan dated 10/28/2017 shows diffuse involvement of the lymph nodes in the neck, chest, retroperitoneum, and inguinal regions.  MRI of the brain dated 10/24/2017 shows no parenchymal brain lesions but positive bilateral intraorbital masses.  Bone marrow biopsy 10/17/2017+ for involvement by mantle cell lymphoma.  He is undergoing chemotherapy.  Today he states   Past Medical History:  Diagnosis Date  . Anxiety   . Arthritis   . BPH (benign prostatic hyperplasia)   . Chronic bronchitis   . COPD (chronic obstructive pulmonary disease) ([place])   . Depression   . Diabetes mellitus without complication ([place])    Resolved; not needing medications at this time.   Marland Kitchen GERD (gastroesophageal reflux disease)   . Hypertension   . Lymphoma, mantle cell ([place])     Past Surgical History:  Procedure Laterality Date  . BIOPSY  10/07/2017   Procedure: BIOPSY;  Surgeon: Daneil Dolin, MD;  [facility]: [facility];  Service: Endoscopy;;  gastroesophageal junction bx ileocecal valve  . carpal tunnel right hand    . CERVICAL FUSION    . COLONOSCOPY WITH PROPOFOL N/A 10/07/2017   Procedure: COLONOSCOPY WITH PROPOFOL;  Surgeon: Daneil Dolin, MD;  Location: [facility];  Service: Endoscopy;  Laterality: N/A;  10:00am  . ESOPHAGOGASTRODUODENOSCOPY (EGD) WITH PROPOFOL N/A 10/07/2017   Procedure: ESOPHAGOGASTRODUODENOSCOPY (EGD) WITH PROPOFOL;  Surgeon: Daneil Dolin, MD;  [facility];  Service: Endoscopy;  Laterality: N/A;  . IR IMAGING GUIDED PORT INSERTION  10/30/2017  . left knee replacement    . left knee revised X 3    . multiple knee arthroscopies    . POLYPECTOMY  10/07/2017   Procedure: POLYPECTOMY;  Surgeon: Daneil Dolin, MD;  [facility]: [facility];  Service: Endoscopy;;  colon  . right knee replacement    . tarsal tunnel repair    . THYROIDECTOMY, PARTIAL    . TONSILLECTOMY      Current Outpatient Medications  Medication Sig Dispense Refill  . albuterol (PROVENTIL HFA;VENTOLIN HFA) 108 (90 Base) MCG/ACT inhaler Inhale 2 puffs into the lungs every 6 (six) hours as needed for wheezing or shortness of breath.    . allopurinol (ZYLOPRIM) 300 MG tablet Take 1 tablet (300 mg total) by mouth daily. [place]  tablet 3  . amLODipine (NORVASC) 5 MG tablet Take 5 mg by mouth every evening.    Marland Kitchen amLODipine (NORVASC) 5 MG tablet     . aspirin 325 MG tablet Take 325 mg by mouth daily.     Marland Kitchen atorvastatin (LIPITOR) 20 MG tablet Take 20 mg by mouth every evening. Reported on 06/07/2015    . bendamustine in sodium chloride 0.9 % 50 mL Inject into the vein once.    . diclofenac (VOLTAREN) 75 MG EC tablet TAKE 1 TABLET TWICE DAILY 180 tablet 5  .  diphenhydrAMINE (BENADRYL) 25 MG tablet Take 1 tablet (25 mg total) by mouth every 6 (six) hours as needed for itching. 30 tablet 0  . gabapentin (NEURONTIN) 600 MG tablet Take 600 mg by mouth 3 (three) times daily.    Marland Kitchen lidocaine-prilocaine (EMLA) cream Apply to affected area once 30 g 3  . LORazepam (ATIVAN) 2 MG tablet Take 2 mg by mouth at bedtime.    Marland Kitchen LORazepam (ATIVAN) 2 MG tablet     . metoprolol (LOPRESSOR) 50 MG tablet Take 50 mg by mouth every evening.     . nitroGLYCERIN (NITROSTAT) 0.4 MG SL tablet Place 0.4 mg under the tongue every 5 (five) minutes x 3 doses as needed. For chest pain    . omeprazole (PRILOSEC OTC) 20 MG tablet Take 20 mg by mouth 2 (two) times daily. Reported on 06/07/2015    . oxyCODONE-acetaminophen (PERCOCET) 7.5-325 MG tablet Take 1 tablet by mouth every 4 (four) hours as needed. 90 tablet 0  . oxyCODONE-acetaminophen (PERCOCET) 7.5-325 MG tablet Take by mouth.    . prochlorperazine (COMPAZINE) 10 MG tablet Take 1 tablet (10 mg total) by mouth every 6 (six) hours as needed (Nausea or vomiting). 30 tablet 1  . riTUXimab (RITUXAN IV) Inject into the vein.    . Tamsulosin HCl (FLOMAX) 0.4 MG CAPS Take 0.4 mg by mouth daily. Reported on 06/07/2015    . venlafaxine (EFFEXOR-XR) 150 MG 24 hr capsule Take 150 mg by mouth every evening.     . venlafaxine XR (EFFEXOR-XR) 75 MG 24 hr capsule      No current facility-administered medications for this visit.     Allergies as of 01/13/2018 - Review Complete 12/30/2017  Allergen Reaction Noted  . Bee venom Anaphylaxis 06/07/2015  . Feldene [piroxicam] Rash and Dermatitis 07/20/2010    Family History  Problem Relation Age of Onset  . Colon cancer Maternal Grandmother   . Colon cancer Paternal Grandmother   . Heart disease Mother   . Arthritis Mother   . Prostate cancer Father   . Heart attack Brother   . Heart disease Maternal Aunt   . Cancer Maternal Aunt   . Heart disease Maternal Uncle   . Cancer Maternal  Uncle   . Heart disease Paternal Aunt   . Heart disease Paternal Uncle   . Heart disease Brother   . Heart attack Brother   . Arthritis Brother     Social History   Socioeconomic History  . Marital status: Married    Spouse name: Jonelle Sidle  . Number of children: 4  . Years of education: Not on file  . Highest education level: Bachelor's degree (e.g., BA, AB, BS)  Occupational History  . 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 Financial resource strain: Somewhat hard  . Food insecurity:    Worry: Sometimes true    Inability: Never true  . Transportation needs:    Medical: No    Non-medical: No  Tobacco Use  . Smoking status: Current Every Day Smoker    Packs/day: 1.25    Years: 50.00    Pack years: 62.50    Types: Cigarettes  . Smokeless tobacco: Never Used  Substance and Sexual Activity  . Alcohol use: No  . Drug use: No  . Sexual activity: Yes    Partners: Female    Birth control/protection: None    Comment: spouse  Lifestyle  . Physical activity:    Days per week: 3 days    Minutes per session: 150+ min  . Stress: Very much  Relationships  . Social connections:    Talks on phone: Once a week    Gets together: Never    Attends religious service: Never    Active member of club or organization: No    Attends meetings of clubs or organizations: Never    Relationship status: Married  Other Topics Concern  . Not on file  Social History Narrative  . Not on file    Review of Systems: General: Negative for anorexia, weight loss, fever, chills, fatigue, weakness. Eyes: Negative for vision changes.  ENT: Negative for hoarseness, difficulty swallowing , nasal congestion. CV: Negative for chest pain, angina, palpitations, dyspnea on exertion, peripheral edema.  Respiratory: Negative for dyspnea at rest, dyspnea on exertion,  cough, sputum, wheezing.  GI: See history of present illness. GU:  Negative for dysuria, hematuria, urinary incontinence, urinary frequency, nocturnal urination.  MS: Negative for joint pain, low back pain.  Derm: Negative for rash or itching.  Neuro: Negative for weakness, abnormal sensation, seizure, frequent headaches, memory loss, confusion.  Psych: Negative for anxiety, depression, suicidal ideation, hallucinations.  Endo: Negative for unusual weight change.  Heme: Negative for bruising or bleeding. Allergy: Negative for rash or hives.   Physical Exam: There were no vitals taken for this visit. General:   Alert and oriented. Pleasant and cooperative. Well-nourished and well-developed.  Head:  Normocephalic and atraumatic. Eyes:  Without icterus, sclera clear and conjunctiva pink.  Ears:  Normal auditory acuity. Mouth:  No deformity or lesions, oral mucosa pink.  Throat/Neck:  Supple, without mass or thyromegaly. Cardiovascular:  S1, S2 present without murmurs appreciated. Normal pulses noted. Extremities without clubbing or edema. Respiratory:  Clear to auscultation bilaterally. No wheezes, rales, or rhonchi. No distress.  Gastrointestinal:  +BS, soft, non-tender and non-distended. No HSM noted. No guarding or rebound. No masses appreciated.  Rectal:  Deferred  Musculoskalatal:  Symmetrical without gross deformities. Normal posture. Skin:  Intact without significant lesions or rashes. Neurologic:  Alert and oriented x4;  grossly normal neurologically. Psych:  Alert and cooperative. Normal mood and affect. Heme/Lymph/Immune: No significant cervical adenopathy. No excessive bruising noted.    01/13/2018 9:59 AM   Disclaimer: This note was dictated with voice recognition software. Similar sounding words can inadvertently be transcribed and may not be corrected upon review.

## 2018-01-14 ENCOUNTER — Ambulatory Visit: Payer: Medicare HMO | Admitting: Nurse Practitioner

## 2018-01-14 DIAGNOSIS — M79605 Pain in left leg: Secondary | ICD-10-CM | POA: Diagnosis not present

## 2018-01-14 DIAGNOSIS — E782 Mixed hyperlipidemia: Secondary | ICD-10-CM | POA: Diagnosis not present

## 2018-01-14 DIAGNOSIS — R1011 Right upper quadrant pain: Secondary | ICD-10-CM | POA: Diagnosis not present

## 2018-01-14 DIAGNOSIS — F419 Anxiety disorder, unspecified: Secondary | ICD-10-CM | POA: Diagnosis not present

## 2018-01-14 DIAGNOSIS — E119 Type 2 diabetes mellitus without complications: Secondary | ICD-10-CM | POA: Diagnosis not present

## 2018-01-14 DIAGNOSIS — M5417 Radiculopathy, lumbosacral region: Secondary | ICD-10-CM | POA: Diagnosis not present

## 2018-01-14 DIAGNOSIS — E875 Hyperkalemia: Secondary | ICD-10-CM | POA: Diagnosis not present

## 2018-01-14 DIAGNOSIS — C8310 Mantle cell lymphoma, unspecified site: Secondary | ICD-10-CM | POA: Diagnosis not present

## 2018-01-14 DIAGNOSIS — Z6831 Body mass index (BMI) 31.0-31.9, adult: Secondary | ICD-10-CM | POA: Diagnosis not present

## 2018-01-16 ENCOUNTER — Other Ambulatory Visit: Payer: Self-pay

## 2018-01-16 MED ORDER — OXYCODONE-ACETAMINOPHEN 7.5-325 MG PO TABS: 1.0000 | ORAL_TABLET | ORAL | 0 refills | Status: DC | PRN

## 2018-01-20 ENCOUNTER — Ambulatory Visit (HOSPITAL_COMMUNITY)
Admission: RE | Admit: 2018-01-20 | Discharge: 2018-01-20 | Disposition: A | Discharge status: Home / Self Care | Payer: Medicare HMO | Source: Ambulatory Visit | Attending: Internal Medicine | Admitting: Internal Medicine

## 2018-01-20 DIAGNOSIS — N281 Cyst of kidney, acquired: Secondary | ICD-10-CM | POA: Diagnosis not present

## 2018-01-20 DIAGNOSIS — C8310 Mantle cell lymphoma, unspecified site: Secondary | ICD-10-CM | POA: Diagnosis not present

## 2018-01-20 DIAGNOSIS — J439 Emphysema, unspecified: Secondary | ICD-10-CM | POA: Diagnosis not present

## 2018-01-20 DIAGNOSIS — C8318 Mantle cell lymphoma, lymph nodes of multiple sites: Secondary | ICD-10-CM | POA: Diagnosis not present

## 2018-01-20 DIAGNOSIS — I700 Atherosclerosis of aorta: Secondary | ICD-10-CM | POA: Diagnosis not present

## 2018-01-20 MED ORDER — FLUDEOXYGLUCOSE F - 18 (FDG) INJECTION
13.8000 | Freq: Once | INTRAVENOUS | Status: AC | PRN
  Administered 2018-01-20: 13.8 via INTRAVENOUS

## 2018-01-27 ENCOUNTER — Ambulatory Visit (HOSPITAL_COMMUNITY): Payer: Medicare HMO

## 2018-01-27 ENCOUNTER — Ambulatory Visit (HOSPITAL_COMMUNITY): Payer: Medicare HMO | Admitting: Internal Medicine

## 2018-01-27 ENCOUNTER — Other Ambulatory Visit (HOSPITAL_COMMUNITY): Payer: Medicare HMO

## 2018-01-27 ENCOUNTER — Other Ambulatory Visit (HOSPITAL_COMMUNITY): Payer: Self-pay | Admitting: Internal Medicine

## 2018-01-27 NOTE — Assessment & Plan Note (Addendum)
Stage IVB mantle cell lymphoma, SOX 11+, biopsy-proven on EGD on 10/07/2017 with nodularity and ulceration at GE junction on gastric side of cardia and pathology revealing mantle cell lymphoma, CD20+, CD79A+, CD5 +, and cyclin D1 +.  Colonoscopy on 10/07/2017 demonstrated a 5 mm polyp in transverse colon that was resected and this too was positive for lymphoma.  Additional finding on colonoscopy was mucosal ulceration at the ileocecal valve with pathology being positive for lymphoma.  Staging PET scan on 10/28/2017 demonstrated diffuse involvement with LN in neck, chest, retroperitoneaum, and right inguinal regions.  MRI brain on 10/24/2017 demonstrated bilateral intra-orbital masses, right side measuring 3 cm and left side measuring 1.2 cm.  Bone marrow aspiration and biopsy on 10/17/2017 was POSITIVE for involvement of mantle cell lymphoma.  2D echocardiogram on 10/18/2017 was normal with LVEF of 55- 60%.  He is currently on BR beginning on 11/04/2017.  He was not considered a good candidate for HyperCVAD.  Response evaluation PET scan on 01/20/2018 demonstrated a significant partial response to therapy.  Plan is proceed with cycle #4 of BR followed by consolidative autoSCT.  Labs today: CBC diff, CMET, LDH, magnesium.  I personally reviewed and went over laboratory results with the patient.  The results are noted within this dictation.  Blood counts today demonstrate a white blood cell count of 5.9 with an ANC of 4.2, moderate lymphopenia 0.4, eosinophilia 0.7, hemoglobin 12.3 g/dL, and mild thrombocytopenia at 108,000.  Metabolic panel is unimpressive.  LDH is within normal limits.  Magnesium is low at 1.6.  Labs satisfy treatment parameters.  We will give 2 g of IV magnesium today for hypomagnesemia.  Today is D1C4 of BR.  Nursing, in accordance with chemotherapy administration protocol, will monitor for acute side effects/toxicities associated with chemotherapy administration today.  I personally reviewed and  went over radiographic studies with the patient.  The results are noted within this dictation.  I personally reviewed the images in PACS. Response evaluation PET scan on 01/21/2018 demonstrated significant partial metabolic response with mild residual hypermetabolic activity within the right neck and the left axillary nodes without any new or progressive disease.  Based upon his response to therapy, will refer the patient to [facility] for consideration of consolidative autoSCT.  He was not felt to be a candidate for HyperCVAD and he has experienced a "significant partial response" to therapy following 3 cycle of BR.  In the interim, will continue with BR therapy.  Labs in 4 weeks: CBC diff, CMET, LDH, ESR, magnesium, and phosphorus.  He will continue with allopurinol as prescribed.  Given his lymphoma diagnosis, he is at intermediate risk for HSV/VZV.  In addition, his lymphocyte count is moderately low.  I will start prophylactic acyclovir therapy to reduce the risk.  Additionally, he will need prophylactic acyclovir 6-12 months following autologous stem cell transplant.  Rx is sent to his pharmacy for acyclovir 400 mg twice daily.  He has a purpura rash that is pruritic.  It is affecting his distal upper extremities and distal lower extremities.  He is using over-the-counter Benadryl and poison ivy ointment/cream.  We will continue these and I have recommended Pepcid 20 mg daily.  This is over-the-counter.  He will take this in addition to his PPI therapy.  If ineffective, he is advised to call us back at which time we could consider Atarax.  Return in 4 weeks for follow-up and continued BR therapy.

## 2018-01-28 ENCOUNTER — Ambulatory Visit (HOSPITAL_COMMUNITY): Payer: Medicare HMO

## 2018-01-28 ENCOUNTER — Other Ambulatory Visit: Payer: Self-pay

## 2018-01-28 ENCOUNTER — Encounter (HOSPITAL_COMMUNITY): Payer: Self-pay | Admitting: Oncology

## 2018-01-28 ENCOUNTER — Inpatient Hospital Stay (HOSPITAL_COMMUNITY): Payer: Medicare HMO | Attending: Hematology

## 2018-01-28 ENCOUNTER — Inpatient Hospital Stay (HOSPITAL_COMMUNITY): Payer: Medicare HMO | Admitting: Oncology

## 2018-01-28 VITALS — BP 151/79 | HR 66 | Temp 97.8°F | Resp 18 | Wt 214.2 lb

## 2018-01-28 VITALS — BP 141/79 | HR 75 | Temp 98.1°F | Resp 18

## 2018-01-28 DIAGNOSIS — M1990 Unspecified osteoarthritis, unspecified site: Secondary | ICD-10-CM | POA: Diagnosis not present

## 2018-01-28 DIAGNOSIS — Z23 Encounter for immunization: Secondary | ICD-10-CM | POA: Diagnosis not present

## 2018-01-28 DIAGNOSIS — F329 Major depressive disorder, single episode, unspecified: Secondary | ICD-10-CM | POA: Insufficient documentation

## 2018-01-28 DIAGNOSIS — C8318 Mantle cell lymphoma, lymph nodes of multiple sites: Secondary | ICD-10-CM | POA: Insufficient documentation

## 2018-01-28 DIAGNOSIS — D692 Other nonthrombocytopenic purpura: Secondary | ICD-10-CM | POA: Diagnosis not present

## 2018-01-28 DIAGNOSIS — F17210 Nicotine dependence, cigarettes, uncomplicated: Secondary | ICD-10-CM

## 2018-01-28 DIAGNOSIS — K219 Gastro-esophageal reflux disease without esophagitis: Secondary | ICD-10-CM | POA: Insufficient documentation

## 2018-01-28 DIAGNOSIS — F17200 Nicotine dependence, unspecified, uncomplicated: Secondary | ICD-10-CM

## 2018-01-28 DIAGNOSIS — E1165 Type 2 diabetes mellitus with hyperglycemia: Secondary | ICD-10-CM | POA: Diagnosis not present

## 2018-01-28 DIAGNOSIS — Z86010 Personal history of colonic polyps: Secondary | ICD-10-CM | POA: Diagnosis not present

## 2018-01-28 DIAGNOSIS — Z79899 Other long term (current) drug therapy: Secondary | ICD-10-CM

## 2018-01-28 DIAGNOSIS — I10 Essential (primary) hypertension: Secondary | ICD-10-CM | POA: Diagnosis not present

## 2018-01-28 DIAGNOSIS — K5730 Diverticulosis of large intestine without perforation or abscess without bleeding: Secondary | ICD-10-CM | POA: Diagnosis not present

## 2018-01-28 DIAGNOSIS — J449 Chronic obstructive pulmonary disease, unspecified: Secondary | ICD-10-CM | POA: Diagnosis not present

## 2018-01-28 DIAGNOSIS — Z5111 Encounter for antineoplastic chemotherapy: Secondary | ICD-10-CM | POA: Insufficient documentation

## 2018-01-28 DIAGNOSIS — Z7689 Persons encountering health services in other specified circumstances: Secondary | ICD-10-CM | POA: Diagnosis not present

## 2018-01-28 DIAGNOSIS — F419 Anxiety disorder, unspecified: Secondary | ICD-10-CM | POA: Diagnosis not present

## 2018-01-28 DIAGNOSIS — D123 Benign neoplasm of transverse colon: Secondary | ICD-10-CM

## 2018-01-28 DIAGNOSIS — N400 Enlarged prostate without lower urinary tract symptoms: Secondary | ICD-10-CM | POA: Diagnosis not present

## 2018-01-28 LAB — CBC WITH DIFFERENTIAL/PLATELET
Abs Immature Granulocytes: 0.04 10*3/uL (ref 0.00–0.07)
Basophils Absolute: 0.1 10*3/uL (ref 0.0–0.1)
Basophils Relative: 1 %
EOS ABS: 0.7 10*3/uL — AB (ref 0.0–0.5)
Eosinophils Relative: 12 %
HCT: 38 % — ABNORMAL LOW (ref 39.0–52.0)
Hemoglobin: 12.3 g/dL — ABNORMAL LOW (ref 13.0–17.0)
IMMATURE GRANULOCYTES: 1 %
Lymphocytes Relative: 6 %
Lymphs Abs: 0.4 10*3/uL — ABNORMAL LOW (ref 0.7–4.0)
MCH: 29.7 pg (ref 26.0–34.0)
MCHC: 32.4 g/dL (ref 30.0–36.0)
MCV: 91.8 fL (ref 80.0–100.0)
MONO ABS: 0.6 10*3/uL (ref 0.1–1.0)
MONOS PCT: 9 %
NEUTROS ABS: 4.2 10*3/uL (ref 1.7–7.7)
NEUTROS PCT: 71 %
PLATELETS: 108 10*3/uL — AB (ref 150–400)
RBC: 4.14 MIL/uL — AB (ref 4.22–5.81)
RDW: 16.7 % — AB (ref 11.5–15.5)
WBC: 5.9 10*3/uL (ref 4.0–10.5)
nRBC: 0 % (ref 0.0–0.2)

## 2018-01-28 LAB — COMPREHENSIVE METABOLIC PANEL
ALT: 17 U/L (ref 0–44)
AST: 19 U/L (ref 15–41)
Albumin: 3.7 g/dL (ref 3.5–5.0)
Alkaline Phosphatase: 79 U/L (ref 38–126)
Anion gap: 9 (ref 5–15)
BUN: 11 mg/dL (ref 8–23)
CALCIUM: 8.8 mg/dL — AB (ref 8.9–10.3)
CHLORIDE: 104 mmol/L (ref 98–111)
CO2: 25 mmol/L (ref 22–32)
CREATININE: 1.07 mg/dL (ref 0.61–1.24)
GFR calc Af Amer: >60 mL/min (ref >60)
GFR calc non Af Amer: >60 mL/min (ref >60)
Glucose, Bld: 112 mg/dL — ABNORMAL HIGH (ref 70–99)
Potassium: 4.4 mmol/L (ref 3.5–5.1)
SODIUM: 138 mmol/L (ref 135–145)
TOTAL PROTEIN: 6.6 g/dL (ref 6.5–8.1)
Total Bilirubin: 0.6 mg/dL (ref 0.3–1.2)

## 2018-01-28 LAB — LACTATE DEHYDROGENASE: LDH: 143 U/L (ref 98–192)

## 2018-01-28 LAB — MAGNESIUM: Magnesium: 1.6 mg/dL — ABNORMAL LOW (ref 1.7–2.4)

## 2018-01-28 MED ORDER — FAMOTIDINE IN NACL 20-0.9 MG/50ML-% IV SOLN
INTRAVENOUS | Status: AC
  Filled 2018-01-28: qty 50

## 2018-01-28 MED ORDER — SODIUM CHLORIDE 0.9 % IV SOLN
10.0000 mg | Freq: Once | INTRAVENOUS | Status: AC
  Administered 2018-01-28: 10 mg via INTRAVENOUS
  Filled 2018-01-28: qty 1

## 2018-01-28 MED ORDER — SODIUM CHLORIDE 0.9 % IV SOLN
Freq: Once | INTRAVENOUS | Status: AC
  Administered 2018-01-28: 09:00:00 via INTRAVENOUS

## 2018-01-28 MED ORDER — SODIUM CHLORIDE 0.9 % IV SOLN
90.0000 mg/m2 | Freq: Once | INTRAVENOUS | Status: AC
  Administered 2018-01-28: 200 mg via INTRAVENOUS
  Filled 2018-01-28: qty 8

## 2018-01-28 MED ORDER — ACYCLOVIR 400 MG PO TABS: 400.0000 mg | ORAL_TABLET | Freq: Two times a day (BID) | ORAL | 5 refills | Status: AC

## 2018-01-28 MED ORDER — SODIUM CHLORIDE 0.9 % IV SOLN
375.0000 mg/m2 | Freq: Once | INTRAVENOUS | Status: AC
  Administered 2018-01-28: 800 mg via INTRAVENOUS
  Filled 2018-01-28: qty 50

## 2018-01-28 MED ORDER — ACETAMINOPHEN 325 MG PO TABS
650.0000 mg | ORAL_TABLET | Freq: Once | ORAL | Status: AC
  Administered 2018-01-28: 650 mg via ORAL

## 2018-01-28 MED ORDER — DIPHENHYDRAMINE HCL 50 MG/ML IJ SOLN
50.0000 mg | Freq: Once | INTRAMUSCULAR | Status: AC
  Administered 2018-01-28: 50 mg via INTRAVENOUS

## 2018-01-28 MED ORDER — PALONOSETRON HCL INJECTION 0.25 MG/5ML
0.2500 mg | Freq: Once | INTRAVENOUS | Status: AC
  Administered 2018-01-28: 0.25 mg via INTRAVENOUS

## 2018-01-28 MED ORDER — FAMOTIDINE IN NACL 20-0.9 MG/50ML-% IV SOLN
20.0000 mg | Freq: Two times a day (BID) | INTRAVENOUS | Status: DC
  Administered 2018-01-28: 20 mg via INTRAVENOUS

## 2018-01-28 MED ORDER — ACETAMINOPHEN 325 MG PO TABS
ORAL_TABLET | ORAL | Status: AC
  Filled 2018-01-28: qty 2

## 2018-01-28 MED ORDER — SODIUM CHLORIDE 0.9% FLUSH
10.0000 mL | INTRAVENOUS | Status: DC | PRN
  Administered 2018-01-28: 10 mL
  Filled 2018-01-28: qty 10

## 2018-01-28 MED ORDER — INFLUENZA VAC SPLIT HIGH-DOSE 0.5 ML IM SUSY
PREFILLED_SYRINGE | INTRAMUSCULAR | Status: AC
  Filled 2018-01-28: qty 0.5

## 2018-01-28 MED ORDER — PALONOSETRON HCL INJECTION 0.25 MG/5ML
INTRAVENOUS | Status: AC
  Filled 2018-01-28: qty 5

## 2018-01-28 MED ORDER — DIPHENHYDRAMINE HCL 50 MG/ML IJ SOLN
INTRAMUSCULAR | Status: AC
  Filled 2018-01-28: qty 1

## 2018-01-28 MED ORDER — INFLUENZA VAC SPLIT HIGH-DOSE 0.5 ML IM SUSY
0.5000 mL | PREFILLED_SYRINGE | INTRAMUSCULAR | Status: AC
  Administered 2018-01-28: 0.5 mL via INTRAMUSCULAR

## 2018-01-28 MED ORDER — MAGNESIUM SULFATE 2 GM/50ML IV SOLN
2.0000 g | Freq: Once | INTRAVENOUS | Status: AC
  Administered 2018-01-28: 2 g via INTRAVENOUS
  Filled 2018-01-28: qty 50

## 2018-01-28 NOTE — Patient Instructions (Signed)
[facility] Discharge Instructions for Patients Receiving Chemotherapy   Beginning January 23rd 2017 lab work for the [facility] will be done in the  Main lab at [facility] on 1st floor. If you have a lab appointment with the [facility] please come in thru the  Main Entrance and check in at the main information desk   Today you received the following chemotherapy agents Bendamustine and Rituxan  To help prevent nausea and vomiting after your treatment, we encourage you to take your nausea medication   If you develop nausea and vomiting, or diarrhea that is not controlled by your medication, call the clinic.  The clinic phone number is [phone]. Office hours are Monday-Friday 8:30am-5:00pm.  BELOW ARE SYMPTOMS THAT SHOULD BE REPORTED IMMEDIATELY:  *FEVER GREATER THAN 101.0 F  *CHILLS WITH OR WITHOUT FEVER  NAUSEA AND VOMITING THAT IS NOT CONTROLLED WITH YOUR NAUSEA MEDICATION  *UNUSUAL SHORTNESS OF BREATH  *UNUSUAL BRUISING OR BLEEDING  TENDERNESS IN MOUTH AND THROAT WITH OR WITHOUT PRESENCE OF ULCERS  *URINARY PROBLEMS  *BOWEL PROBLEMS  UNUSUAL RASH Items with * indicate a potential emergency and should be followed up as soon as possible. If you have an emergency after office hours please contact your primary care physician or go to the nearest emergency department.  Please call the clinic during office hours if you have any questions or concerns.   You may also contact the Patient Navigator at [phone] should you have any questions or need assistance in obtaining follow up care.      Resources For Cancer Patients and their Caregivers ? American Cancer Society: Can assist with transportation, wigs, general needs, runs Look Good Feel Better.        872-446-0768 ? Cancer Care: Provides financial assistance, online support groups, medication/co-pay assistance.  1-800-813-HOPE 906-683-2995) ? [place] Assists  [place] cancer patients and their families through emotional , educational and financial support.  (240) 078-7425 ? [place] DSS Where to apply for food stamps, Medicaid and utility assistance. 714-310-5139 ? [facility]: Transportation to medical appointments. 612-810-4656 ? Social Security Administration: May apply for disability if have a Stage IV cancer. 418-496-0139 (615) 448-3152 ? [employer], Disability and Transit Services: Assists with nutrition, care and transit needs. ([phone])667-6174

## 2018-01-28 NOTE — Progress Notes (Signed)
0920 labs reviewed and patient seen by Gershon Mussel, [place]. Ok to proceed with treatment today.  Burnett Corrente tolerated Rituxan and Bendamustine without incident or complaint. VSS upon completion of treatment. Port flushed and deaccessed per pt request. Discharged self ambulatory in satisfactory condition in presence of wife.

## 2018-01-28 NOTE — Progress Notes (Signed)
t       Celene Squibb, MD [address] [place] [address]  Mantle cell lymphoma of lymph nodes of multiple regions [facility]) - Plan: CBC with Differential, Comprehensive metabolic panel, Lactate dehydrogenase, Phosphorus, Magnesium, Sedimentation rate, acyclovir (ZOVIRAX) 400 MG tablet  Encounter for antineoplastic chemotherapy  TOBACCO USER  Essential hypertension  Type 2 diabetes mellitus with hyperglycemia, without long-term current use of insulin (HCC)  Gastroesophageal reflux disease, esophagitis presence not specified  Hypomagnesemia  Purpura ([place])   HISTORY OF PRESENT ILLNESS: Stage IVB mantle cell lymphoma, SOX 11+, biopsy-proven on EGD on 10/07/2017 with nodularity and ulceration at GE junction on gastric side of cardia and pathology revealing mantle cell lymphoma, CD20+, CD79A+, CD5 +, and cyclin D1 +.  Colonoscopy on 10/07/2017 demonstrated a 5 mm polyp in transverse colon that was resected and this too was positive for lymphoma.  Additional finding on colonoscopy was mucosal ulceration at the ileocecal valve with pathology being positive for lymphoma.  Staging PET scan on 10/28/2017 demonstrated diffuse involvement with LN in neck, chest, retroperitoneaum, and right inguinal regions.  MRI brain on 10/24/2017 demonstrated bilateral intra-orbital masses, right side measuring 3 cm and left side measuring 1.2 cm.  Bone marrow aspiration and biopsy on 10/17/2017 was POSITIVE for involvement of mantle cell lymphoma.  2D echocardiogram on 10/18/2017 was normal with LVEF of 55- 60%.  He is currently on BR beginning on 11/04/2017.  He was not considered a good candidate for HyperCVAD.  Response evaluation PET scan on 01/20/2018 demonstrated a significant partial response to therapy.  Plan is proceed with cycle #4 of BR followed by consolidative autoSCT.  CURRENT THERAPY: Bendamustine/Rituxan (BR)  CURRENT STATUS: [name] 72 y.o. male returns for followup of stage IV mantle cell  lymphoma, currently on bendamustine/Rituxan.  Tolerating reasonably well.  He denies any nausea or vomiting.  He does admit to a pruritic rash affecting his distal extremities (upper and lower).  It has been present for approximately 2 weeks and he has been utilizing over-the-counter interventions without significant benefit.  It is worse at night.  He denies any new lumps or bumps on his own examination.  He denies any B symptoms.  He denies any recurrent infections or antibiotic requirements.  His appetite he rates a 50% and his energy level is a 50%.  He denies any new pain.  Review of Systems  Constitutional: Negative.  Negative for chills, fever and weight loss.  HENT: Negative.   Eyes: Negative.   Respiratory: Negative.  Negative for cough.   Cardiovascular: Negative.  Negative for chest pain.  Gastrointestinal: Positive for constipation. Negative for blood in stool, diarrhea, melena, nausea and vomiting.  Genitourinary: Negative.   Musculoskeletal: Negative.   Skin: Positive for itching and rash.  Neurological: Positive for tingling. Negative for weakness.  Endo/Heme/Allergies: Negative.   Psychiatric/Behavioral: Negative.     Past Medical History:  Diagnosis Date  . Anxiety   . Arthritis   . BPH (benign prostatic hyperplasia)   . Chronic bronchitis   . COPD (chronic obstructive pulmonary disease) ([place])   . Depression   . Diabetes mellitus without complication ([place])    Resolved; not needing medications at this time.  Marland Kitchen GERD (gastroesophageal reflux disease)   . Hypertension   . Lymphoma, mantle cell ([place])     Past Surgical History:  Procedure Laterality Date  . BIOPSY  10/07/2017   Procedure: BIOPSY;  Surgeon: Daneil Dolin, MD;  Location[facility];  Service: Endoscopy;;  gastroesophageal junction bx ileocecal valve  . carpal tunnel right hand    . CERVICAL FUSION    . COLONOSCOPY WITH PROPOFOL N/A 10/07/2017   Procedure: COLONOSCOPY WITH PROPOFOL;  Surgeon: Daneil Dolin, MD;  Location: [facility];  Service: Endoscopy;  Laterality: N/A;  10:00am  . ESOPHAGOGASTRODUODENOSCOPY (EGD) WITH PROPOFOL N/A 10/07/2017   Procedure: ESOPHAGOGASTRODUODENOSCOPY (EGD) WITH PROPOFOL;  Surgeon: Daneil Dolin, MD;  Location[facility];  Service: Endoscopy;  Laterality: N/A;  . IR IMAGING GUIDED PORT INSERTION  10/30/2017  . left knee replacement    . left knee revised X 3    . multiple knee arthroscopies    . POLYPECTOMY  10/07/2017   Procedure: POLYPECTOMY;  Surgeon: Daneil Dolin, MD;  Location[facility];  Service: Endoscopy;;  colon  . right knee replacement    . tarsal tunnel repair    . THYROIDECTOMY, PARTIAL    . TONSILLECTOMY      Family History  Problem Relation Age of Onset  . Colon cancer Maternal Grandmother   . Colon cancer Paternal Grandmother   . Heart disease Mother   . Arthritis Mother   . Prostate cancer Father   . Heart attack Brother   . Heart disease Maternal Aunt   . Cancer Maternal Aunt   . Heart disease Maternal Uncle   . Cancer Maternal Uncle   . Heart disease Paternal Aunt   . Heart disease Paternal Uncle   . Heart disease Brother   . Heart attack Brother   . Arthritis Brother     Social History   Socioeconomic History  . Marital status: Married    Spouse name: Jonelle Sidle  . Number of children: 4  . Years of education: Not on file  . Highest education level: Bachelor's degree (e.g., BA, AB, BS)  Occupational History  . 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 Financial resource strain: Somewhat hard  . Food insecurity:    Worry: Sometimes true    Inability: Never true  . Transportation needs:    Medical: No    Non-medical: No  Tobacco Use  . Smoking status: Current Every Day Smoker    Packs/day: 1.25    Years: 50.00    Pack years: 62.50    Types: Cigarettes  .  Smokeless tobacco: Never Used  Substance and Sexual Activity  . Alcohol use: No  . Drug use: No  . Sexual activity: Yes    Partners: Female    Birth control/protection: None    Comment: spouse  Lifestyle  . Physical activity:    Days per week: 3 days    Minutes per session: 150+ min  . Stress: Very much  Relationships  . Social connections:    Talks on phone: Once a week    Gets together: Never    Attends religious service: Never    Active member of club or organization: No    Attends meetings of clubs or organizations: Never    Relationship status: Married  Other Topics Concern  . Not on file  Social History Narrative  . Not on file     PHYSICAL EXAMINATION  ECOG PERFORMANCE STATUS: 1 - Symptomatic but completely ambulatory  Vitals:   01/28/18 0841  BP: Mar[place] Kitchen)  151/79  Pulse: 66  Resp: 18  Temp: 97.8 F (36.6 C)  SpO2: 98%    GENERAL:alert, no distress, well nourished, well developed, comfortable, cooperative, smiling and chronically ill-appearing, accompanied by his wife Tammy. SKIN: skin color, texture, turgor are normal, positive for: Purpura rash affecting distal upper extremities and distal lower extremities only.   HEAD: Normocephalic, No masses, lesions, tenderness or abnormalities EYES: normal, EOMI, Conjunctiva are pink and non-injected EARS: External ears normal OROPHARYNX:lips, buccal mucosa, and tongue normal and mucous membranes are moist  NECK: supple, no adenopathy, trachea midline LYMPH:  no palpable lymphadenopathy BREAST:not examined LUNGS: clear to auscultation and percussion HEART: regular rate & rhythm, no murmurs and no gallops ABDOMEN:abdomen soft and normal bowel sounds BACK: Back symmetric, no curvature. EXTREMITIES:less then 2 second capillary refill, no joint deformities, effusion, or inflammation, no clubbing, no cyanosis, see skin exam above NEURO: alert & oriented x 3 with fluent speech, no focal motor/sensory deficits, gait  normal   LABORATORY DATA: CBC    Component Value Date/Time   WBC 5.9 01/28/2018 0821   RBC 4.14 (L) 01/28/2018 0821   HGB 12.3 (L) 01/28/2018 0821   HCT 38.0 (L) 01/28/2018 0821   PLT 108 (L) 01/28/2018 0821   MCV 91.8 01/28/2018 0821   MCH 29.7 01/28/2018 0821   MCHC 32.4 01/28/2018 0821   RDW 16.7 (H) 01/28/2018 0821   LYMPHSABS 0.4 (L) 01/28/2018 0821   MONOABS 0.6 01/28/2018 0821   EOSABS 0.7 (H) 01/28/2018 0821   BASOSABS 0.1 01/28/2018 0821      Chemistry      Component Value Date/Time   NA 138 01/28/2018 0821   K 4.4 01/28/2018 0821   CL 104 01/28/2018 0821   CO2 25 01/28/2018 0821   BUN 11 01/28/2018 0821   CREATININE 1.07 01/28/2018 0821      Component Value Date/Time   CALCIUM 8.8 (L) 01/28/2018 0821   ALKPHOS 79 01/28/2018 0821   AST 19 01/28/2018 0821   ALT 17 01/28/2018 0821   BILITOT 0.6 01/28/2018 0821       RADIOGRAPHIC STUDIES:  Nm Pet Image Restag (ps) Skull Base To Thigh  Result Date: 01/21/2018 CLINICAL DATA:  Subsequent treatment strategy for mantle cell lymphoma with interval chemotherapy. EXAM: NUCLEAR MEDICINE PET SKULL BASE TO THIGH TECHNIQUE: 13.8 mCi F-18 FDG was injected intravenously. Full-ring PET imaging was performed from the skull base to thigh after the radiotracer. CT data was obtained and used for attenuation correction and anatomic localization. Fasting blood glucose: 112 mg/dl COMPARISON:  10/28/2017 PET-CT. FINDINGS: Mediastinal blood pool activity: SUV max 1.5 NECK: There is mild hypermetabolism within a 0.7 cm lymph node at the inferior margin of the right parotid gland with max SUV 2.3, previously 1.3 cm with max SUV 10.0, decreased in size and significantly decreased in metabolism. No additional residual hypermetabolic lymph nodes in the neck. Incidental CT findings: Right hemithyroidectomy. CHEST: Hypermetabolic 0.9 cm left axillary lymph node with max SUV 3.9 (series 3/image 81), previously 1.4 cm with max SUV 4.7, decreased  in size and metabolism. No additional hypermetabolic axillary, mediastinal or hilar lymph nodes. No hypermetabolic pulmonary findings. Incidental CT findings: Right internal jugular MediPort terminates at the cavoatrial junction. Three-vessel coronary atherosclerosis. Atherosclerotic nonaneurysmal thoracic aorta. Coarsely calcified subcarinal and left hilar nodes from prior granulomatous disease. Mild centrilobular emphysema with diffuse bronchial wall thickening. No acute consolidative airspace disease, lung masses or significant pulmonary nodules. ABDOMEN/PELVIS: No abnormal hypermetabolic activity within the liver, pancreas, adrenal glands,  or spleen. No residual hypermetabolic or enlarged lymph nodes in the abdomen or pelvis. Incidental CT findings: Scattered granulomatous calcifications in the liver and spleen. Normal size spleen. Scattered subcentimeter hypodense liver lesions are too small to characterize and are stable, probably benign. Moderate sigmoid diverticulosis. Atherosclerotic nonaneurysmal abdominal aorta. Moderately enlarged prostate with nonspecific internal prostatic calcifications. Simple exophytic 4.9 cm posterior lower left renal cyst. SKELETON: No focal hypermetabolic activity to suggest skeletal metastasis. Incidental CT findings: Surgical hardware from ACDF is noted in the cervical spine. IMPRESSION: 1. Significant partial metabolic response, with mild residual hypermetabolism within right neck and left axillary lymph nodes. No new or progressive disease. [place] 4. 2. Aortic Atherosclerosis (ICD10-I70.0) and Emphysema (ICD10-J43.9). Electronically Signed   By: Ilona Sorrel M.D.   On: 01/21/2018 09:04     PATHOLOGY:    ASSESSMENT AND PLAN:  Lymphoma, mantle cell (HCC) Stage IVB mantle cell lymphoma, SOX 11+, biopsy-proven on EGD on 10/07/2017 with nodularity and ulceration at GE junction on gastric side of cardia and pathology revealing mantle cell lymphoma, CD20+, CD79A+, CD5 +,  and cyclin D1 +.  Colonoscopy on 10/07/2017 demonstrated a 5 mm polyp in transverse colon that was resected and this too was positive for lymphoma.  Additional finding on colonoscopy was mucosal ulceration at the ileocecal valve with pathology being positive for lymphoma.  Staging PET scan on 10/28/2017 demonstrated diffuse involvement with LN in neck, chest, retroperitoneaum, and right inguinal regions.  MRI brain on 10/24/2017 demonstrated bilateral intra-orbital masses, right side measuring 3 cm and left side measuring 1.2 cm.  Bone marrow aspiration and biopsy on 10/17/2017 was POSITIVE for involvement of mantle cell lymphoma.  2D echocardiogram on 10/18/2017 was normal with LVEF of 55- 60%.  He is currently on BR beginning on 11/04/2017.  He was not considered a good candidate for HyperCVAD.  Response evaluation PET scan on 01/20/2018 demonstrated a significant partial response to therapy.  Plan is proceed with cycle #4 of BR followed by consolidative autoSCT.  Labs today: CBC diff, CMET, LDH, magnesium.  I personally reviewed and went over laboratory results with the patient.  The results are noted within this dictation.  Blood counts today demonstrate a white blood cell count of 5.9 with an ANC of 4.2, moderate lymphopenia 0.4, eosinophilia 0.7, hemoglobin 12.3 g/dL, and mild thrombocytopenia at 108,000.  Metabolic panel is unimpressive.  LDH is within normal limits.  Magnesium is low at 1.6.  Labs satisfy treatment parameters.  We will give 2 g of IV magnesium today for hypomagnesemia.  Today is D1C4 of BR.  Nursing, in accordance with chemotherapy administration protocol, will monitor for acute side effects/toxicities associated with chemotherapy administration today.  I personally reviewed and went over radiographic studies with the patient.  The results are noted within this dictation.  I personally reviewed the images in PACS. Response evaluation PET scan on 01/21/2018 demonstrated significant partial  metabolic response with mild residual hypermetabolic activity within the right neck and the left axillary nodes without any new or progressive disease.  Based upon his response to therapy, will refer the patient to [facility] for consideration of consolidative autoSCT.  He was not felt to be a candidate for HyperCVAD and he has experienced a "significant partial response" to therapy following 3 cycle of BR.  In the interim, will continue with BR therapy.  Labs in 4 weeks: CBC diff, CMET, LDH, ESR, magnesium, and phosphorus.  He will continue with allopurinol as prescribed.  Given his lymphoma diagnosis, he  is at intermediate risk for HSV/VZV.  In addition, his lymphocyte count is moderately low.  I will start prophylactic acyclovir therapy to reduce the risk.  Additionally, he will need prophylactic acyclovir 6-12 months following autologous stem cell transplant.  Rx is sent to his pharmacy for acyclovir 400 mg twice daily.  He has a purpura rash that is pruritic.  It is affecting his distal upper extremities and distal lower extremities.  He is using over-the-counter Benadryl and poison ivy ointment/cream.  We will continue these and I have recommended Pepcid 20 mg daily.  This is over-the-counter.  He will take this in addition to his PPI therapy.  If ineffective, he is advised to call us back at which time we could consider Atarax.  Return in 4 weeks for follow-up and continued BR therapy.   2. Encounter for antineoplastic chemotherapy Presenting for cycle #4 of BR today.  3. TOBACCO USER Smoking cessation education provided today.  4. Essential hypertension Blood pressure today is 151/79.  Systolic pressures above goal.  At this point time I am not interested in aggressive management, but will defer to primary care provider.  5. Type 2 diabetes mellitus with hyperglycemia, without long-term current use of insulin (HCC) Not currently on diabetic medications.  6. Gastroesophageal reflux  disease, esophagitis presence not specified Well-controlled on Prilosec  7. Hypomagnesemia Magnesium is 1.6 today.  We will give 2 g of IV mag today with treatment.   ORDERS PLACED FOR THIS ENCOUNTER: Orders Placed This Encounter  Procedures  . CBC with Differential  . Comprehensive metabolic panel  . Lactate dehydrogenase  . Phosphorus  . Magnesium  . Sedimentation rate    MEDICATIONS PRESCRIBED THIS ENCOUNTER: No orders of the defined types were placed in this encounter.   THERAPY PLAN:  Continue with BR.  Will refer patient to [facility] for consideration/evaluation for autoSCT.  All questions were answered. The patient knows to call the clinic with any problems, questions or concerns. We can certainly see the patient much sooner if necessary.  Patient and plan discussed with Dr. Derek Jack and she is in agreement with the aforementioned.   This note is electronically signed by: Robynn Pane, PA-C 01/28/2018 9:30 AM

## 2018-01-28 NOTE — Progress Notes (Signed)
Patient referred to Dr. Berton Bon @ [facility] for BMT evaluation.  Per scheduler, physician will review records in [place].  If additional records needed, they will call.  Once chart reviewed, they will call the patient directly to schedule.

## 2018-01-28 NOTE — Patient Instructions (Addendum)
[place] at [facility] Discharge Instructions  Treatment today as planned.   Recent PET scan demonstrates a good partial response to therapy. Will refer you to [facility] for consideration for bone marrow transplant. Take Pepcid daily (in addition to your Prilosec/omeprazole) to see if this helps with itching.  This medication is over the counter.  If ineffective, call us back. Will start antiviral therapy with Acyclovir twice daily.  This medication is sent to your [place]. Return in 4 weeks for ongoing treatment.   Thank you for choosing [place] at [facility] to provide your oncology and hematology care.  To afford each patient quality time with our provider, please arrive at least 15 minutes before your scheduled appointment time.   If you have a lab appointment with the [place] [facility] please come in thru the  Main Entrance and check in at the main information desk  You need to re-schedule your appointment should you arrive 10 or more minutes late.  We strive to give you quality time with our providers, and arriving late affects you and other patients whose appointments are after yours.  Also, if you no show three or more times for appointments you may be dismissed from the clinic at the providers discretion.     Again, thank you for choosing [facility].  Our hope is that these requests will decrease the amount of time that you wait before being seen by our physicians.       _____________________________________________________________  Should you have questions after your visit to [facility], please contact our office at [phone] between the hours of 8:00 a.m. and 4:30 p.m.  Voicemails left after 4:00 p.m. will not be returned until the following business day.  For prescription refill requests, have your pharmacy contact our office and allow 72 hours.    Cancer Center  Support Programs:   > Cancer Support Group  2nd Tuesday of the month 1pm-2pm, Journey Room

## 2018-01-29 ENCOUNTER — Encounter (HOSPITAL_COMMUNITY): Payer: Self-pay

## 2018-01-29 ENCOUNTER — Inpatient Hospital Stay (HOSPITAL_COMMUNITY): Payer: Medicare HMO

## 2018-01-29 VITALS — BP 120/75 | HR 60 | Temp 97.9°F | Resp 18 | Wt 216.1 lb

## 2018-01-29 DIAGNOSIS — Z5111 Encounter for antineoplastic chemotherapy: Secondary | ICD-10-CM | POA: Diagnosis not present

## 2018-01-29 DIAGNOSIS — F17210 Nicotine dependence, cigarettes, uncomplicated: Secondary | ICD-10-CM | POA: Diagnosis not present

## 2018-01-29 DIAGNOSIS — D123 Benign neoplasm of transverse colon: Secondary | ICD-10-CM | POA: Diagnosis not present

## 2018-01-29 DIAGNOSIS — D692 Other nonthrombocytopenic purpura: Secondary | ICD-10-CM | POA: Diagnosis not present

## 2018-01-29 DIAGNOSIS — C8318 Mantle cell lymphoma, lymph nodes of multiple sites: Secondary | ICD-10-CM

## 2018-01-29 DIAGNOSIS — Z7689 Persons encountering health services in other specified circumstances: Secondary | ICD-10-CM | POA: Diagnosis not present

## 2018-01-29 DIAGNOSIS — Z79899 Other long term (current) drug therapy: Secondary | ICD-10-CM | POA: Diagnosis not present

## 2018-01-29 DIAGNOSIS — Z23 Encounter for immunization: Secondary | ICD-10-CM | POA: Diagnosis not present

## 2018-01-29 MED ORDER — SODIUM CHLORIDE 0.9 % IV SOLN
90.0000 mg/m2 | Freq: Once | INTRAVENOUS | Status: AC
  Administered 2018-01-29: 200 mg via INTRAVENOUS
  Filled 2018-01-29: qty 8

## 2018-01-29 MED ORDER — HEPARIN SOD (PORK) LOCK FLUSH 100 UNIT/ML IV SOLN
500.0000 [IU] | Freq: Once | INTRAVENOUS | Status: AC | PRN
  Administered 2018-01-29: 500 [IU]
  Filled 2018-01-29: qty 5

## 2018-01-29 MED ORDER — PEGFILGRASTIM 6 MG/0.6ML ~~LOC~~ PSKT
6.0000 mg | PREFILLED_SYRINGE | Freq: Once | SUBCUTANEOUS | Status: AC
  Administered 2018-01-29: 6 mg via SUBCUTANEOUS
  Filled 2018-01-29: qty 0.6

## 2018-01-29 MED ORDER — SODIUM CHLORIDE 0.9% FLUSH
10.0000 mL | INTRAVENOUS | Status: DC | PRN
  Administered 2018-01-29: 10 mL
  Filled 2018-01-29: qty 10

## 2018-01-29 MED ORDER — SODIUM CHLORIDE 0.9 % IV SOLN
10.0000 mg | Freq: Once | INTRAVENOUS | Status: AC
  Administered 2018-01-29: 10 mg via INTRAVENOUS
  Filled 2018-01-29: qty 1

## 2018-01-29 MED ORDER — SODIUM CHLORIDE 0.9 % IV SOLN
Freq: Once | INTRAVENOUS | Status: AC
  Administered 2018-01-29: 11:00:00 via INTRAVENOUS

## 2018-01-29 NOTE — Progress Notes (Signed)
Patient tolerated treatment with no complaints voiced.  Port site clean and dry with no bruising or swelling noted at site.  Good blood return noted before and after administration of therapy.  Band aid applied.  VSS with discharge and left by wheelchair with family.  No s/s of distress noted.

## 2018-01-29 NOTE — Patient Instructions (Signed)
[place] Discharge Instructions for Patients Receiving Chemotherapy  Today you received the following chemotherapy agents bendamustine.    If you develop nausea and vomiting that is not controlled by your nausea medication, call the clinic.   BELOW ARE SYMPTOMS THAT SHOULD BE REPORTED IMMEDIATELY:  *FEVER GREATER THAN 100.5 F  *CHILLS WITH OR WITHOUT FEVER  NAUSEA AND VOMITING THAT IS NOT CONTROLLED WITH YOUR NAUSEA MEDICATION  *UNUSUAL SHORTNESS OF BREATH  *UNUSUAL BRUISING OR BLEEDING  TENDERNESS IN MOUTH AND THROAT WITH OR WITHOUT PRESENCE OF ULCERS  *URINARY PROBLEMS  *BOWEL PROBLEMS  UNUSUAL RASH Items with * indicate a potential emergency and should be followed up as soon as possible.  Feel free to call the clinic should you have any questions or concerns. The clinic phone number is [phone].  Please show the [place] at check-in to the Emergency Department and triage nurse.

## 2018-02-05 DIAGNOSIS — C8319 Mantle cell lymphoma, extranodal and solid organ sites: Secondary | ICD-10-CM | POA: Diagnosis not present

## 2018-02-06 ENCOUNTER — Other Ambulatory Visit: Payer: Self-pay

## 2018-02-06 MED ORDER — OXYCODONE-ACETAMINOPHEN 7.5-325 MG PO TABS: 1.0000 | ORAL_TABLET | ORAL | 0 refills | Status: AC | PRN

## 2018-02-11 DIAGNOSIS — E119 Type 2 diabetes mellitus without complications: Secondary | ICD-10-CM | POA: Diagnosis not present

## 2018-02-11 DIAGNOSIS — I10 Essential (primary) hypertension: Secondary | ICD-10-CM | POA: Diagnosis not present

## 2018-02-11 DIAGNOSIS — E782 Mixed hyperlipidemia: Secondary | ICD-10-CM | POA: Diagnosis not present

## 2018-02-14 ENCOUNTER — Other Ambulatory Visit (HOSPITAL_COMMUNITY): Payer: Self-pay | Admitting: Nurse Practitioner

## 2018-02-14 DIAGNOSIS — C8316 Mantle cell lymphoma, intrapelvic lymph nodes: Secondary | ICD-10-CM

## 2018-02-15 ENCOUNTER — Other Ambulatory Visit: Payer: Self-pay

## 2018-02-15 ENCOUNTER — Encounter (HOSPITAL_COMMUNITY): Payer: Self-pay | Admitting: Emergency Medicine

## 2018-02-15 ENCOUNTER — Emergency Department (HOSPITAL_COMMUNITY): Payer: Medicare HMO

## 2018-02-15 ENCOUNTER — Inpatient Hospital Stay (HOSPITAL_COMMUNITY)
Admission: EM | Admit: 2018-02-15 | Discharge: 2018-03-09 | DRG: 871 | Disposition: E | Payer: Medicare HMO | Attending: Internal Medicine | Admitting: Internal Medicine

## 2018-02-15 DIAGNOSIS — R6520 Severe sepsis without septic shock: Secondary | ICD-10-CM

## 2018-02-15 DIAGNOSIS — I76 Septic arterial embolism: Secondary | ICD-10-CM | POA: Diagnosis present

## 2018-02-15 DIAGNOSIS — I110 Hypertensive heart disease with heart failure: Secondary | ICD-10-CM | POA: Diagnosis present

## 2018-02-15 DIAGNOSIS — E871 Hypo-osmolality and hyponatremia: Secondary | ICD-10-CM | POA: Diagnosis not present

## 2018-02-15 DIAGNOSIS — C8310 Mantle cell lymphoma, unspecified site: Secondary | ICD-10-CM | POA: Diagnosis present

## 2018-02-15 DIAGNOSIS — R7881 Bacteremia: Secondary | ICD-10-CM | POA: Diagnosis not present

## 2018-02-15 DIAGNOSIS — I4891 Unspecified atrial fibrillation: Secondary | ICD-10-CM | POA: Diagnosis not present

## 2018-02-15 DIAGNOSIS — R6521 Severe sepsis with septic shock: Secondary | ICD-10-CM

## 2018-02-15 DIAGNOSIS — F419 Anxiety disorder, unspecified: Secondary | ICD-10-CM | POA: Diagnosis present

## 2018-02-15 DIAGNOSIS — R609 Edema, unspecified: Secondary | ICD-10-CM | POA: Diagnosis not present

## 2018-02-15 DIAGNOSIS — N179 Acute kidney failure, unspecified: Secondary | ICD-10-CM | POA: Diagnosis not present

## 2018-02-15 DIAGNOSIS — E872 Acidosis: Secondary | ICD-10-CM | POA: Diagnosis present

## 2018-02-15 DIAGNOSIS — Z794 Long term (current) use of insulin: Secondary | ICD-10-CM

## 2018-02-15 DIAGNOSIS — K219 Gastro-esophageal reflux disease without esophagitis: Secondary | ICD-10-CM | POA: Diagnosis not present

## 2018-02-15 DIAGNOSIS — L8990 Pressure ulcer of unspecified site, unspecified stage: Secondary | ICD-10-CM

## 2018-02-15 DIAGNOSIS — I5033 Acute on chronic diastolic (congestive) heart failure: Secondary | ICD-10-CM | POA: Diagnosis present

## 2018-02-15 DIAGNOSIS — J9602 Acute respiratory failure with hypercapnia: Secondary | ICD-10-CM | POA: Diagnosis not present

## 2018-02-15 DIAGNOSIS — I509 Heart failure, unspecified: Secondary | ICD-10-CM | POA: Diagnosis not present

## 2018-02-15 DIAGNOSIS — R778 Other specified abnormalities of plasma proteins: Secondary | ICD-10-CM

## 2018-02-15 DIAGNOSIS — N400 Enlarged prostate without lower urinary tract symptoms: Secondary | ICD-10-CM | POA: Diagnosis not present

## 2018-02-15 DIAGNOSIS — E440 Moderate protein-calorie malnutrition: Secondary | ICD-10-CM | POA: Diagnosis not present

## 2018-02-15 DIAGNOSIS — Z91030 Bee allergy status: Secondary | ICD-10-CM

## 2018-02-15 DIAGNOSIS — Z888 Allergy status to other drugs, medicaments and biological substances status: Secondary | ICD-10-CM

## 2018-02-15 DIAGNOSIS — I5031 Acute diastolic (congestive) heart failure: Secondary | ICD-10-CM

## 2018-02-15 DIAGNOSIS — J9600 Acute respiratory failure, unspecified whether with hypoxia or hypercapnia: Secondary | ICD-10-CM

## 2018-02-15 DIAGNOSIS — A4102 Sepsis due to Methicillin resistant Staphylococcus aureus: Principal | ICD-10-CM | POA: Diagnosis present

## 2018-02-15 DIAGNOSIS — Z96653 Presence of artificial knee joint, bilateral: Secondary | ICD-10-CM | POA: Diagnosis present

## 2018-02-15 DIAGNOSIS — T451X5A Adverse effect of antineoplastic and immunosuppressive drugs, initial encounter: Secondary | ICD-10-CM | POA: Diagnosis present

## 2018-02-15 DIAGNOSIS — A419 Sepsis, unspecified organism: Secondary | ICD-10-CM

## 2018-02-15 DIAGNOSIS — F329 Major depressive disorder, single episode, unspecified: Secondary | ICD-10-CM | POA: Diagnosis not present

## 2018-02-15 DIAGNOSIS — Z886 Allergy status to analgesic agent status: Secondary | ICD-10-CM | POA: Diagnosis not present

## 2018-02-15 DIAGNOSIS — Y848 Other medical procedures as the cause of abnormal reaction of the patient, or of later complication, without mention of misadventure at the time of the procedure: Secondary | ICD-10-CM | POA: Diagnosis not present

## 2018-02-15 DIAGNOSIS — B9562 Methicillin resistant Staphylococcus aureus infection as the cause of diseases classified elsewhere: Secondary | ICD-10-CM | POA: Diagnosis not present

## 2018-02-15 DIAGNOSIS — R0902 Hypoxemia: Secondary | ICD-10-CM

## 2018-02-15 DIAGNOSIS — Z6830 Body mass index (BMI) 30.0-30.9, adult: Secondary | ICD-10-CM | POA: Diagnosis not present

## 2018-02-15 DIAGNOSIS — R059 Cough, unspecified: Secondary | ICD-10-CM

## 2018-02-15 DIAGNOSIS — R531 Weakness: Secondary | ICD-10-CM | POA: Diagnosis not present

## 2018-02-15 DIAGNOSIS — J438 Other emphysema: Secondary | ICD-10-CM | POA: Diagnosis present

## 2018-02-15 DIAGNOSIS — N281 Cyst of kidney, acquired: Secondary | ICD-10-CM | POA: Diagnosis present

## 2018-02-15 DIAGNOSIS — Z8249 Family history of ischemic heart disease and other diseases of the circulatory system: Secondary | ICD-10-CM

## 2018-02-15 DIAGNOSIS — M25571 Pain in right ankle and joints of right foot: Secondary | ICD-10-CM | POA: Diagnosis not present

## 2018-02-15 DIAGNOSIS — F17210 Nicotine dependence, cigarettes, uncomplicated: Secondary | ICD-10-CM | POA: Diagnosis present

## 2018-02-15 DIAGNOSIS — Z95828 Presence of other vascular implants and grafts: Secondary | ICD-10-CM | POA: Diagnosis not present

## 2018-02-15 DIAGNOSIS — R0989 Other specified symptoms and signs involving the circulatory and respiratory systems: Secondary | ICD-10-CM | POA: Diagnosis not present

## 2018-02-15 DIAGNOSIS — I38 Endocarditis, valve unspecified: Secondary | ICD-10-CM | POA: Diagnosis present

## 2018-02-15 DIAGNOSIS — R7989 Other specified abnormal findings of blood chemistry: Secondary | ICD-10-CM | POA: Diagnosis not present

## 2018-02-15 DIAGNOSIS — M7989 Other specified soft tissue disorders: Secondary | ICD-10-CM | POA: Diagnosis not present

## 2018-02-15 DIAGNOSIS — T380X5A Adverse effect of glucocorticoids and synthetic analogues, initial encounter: Secondary | ICD-10-CM | POA: Diagnosis present

## 2018-02-15 DIAGNOSIS — E1165 Type 2 diabetes mellitus with hyperglycemia: Secondary | ICD-10-CM | POA: Diagnosis present

## 2018-02-15 DIAGNOSIS — I2690 Septic pulmonary embolism without acute cor pulmonale: Secondary | ICD-10-CM | POA: Diagnosis present

## 2018-02-15 DIAGNOSIS — Z8261 Family history of arthritis: Secondary | ICD-10-CM

## 2018-02-15 DIAGNOSIS — Y95 Nosocomial condition: Secondary | ICD-10-CM | POA: Diagnosis not present

## 2018-02-15 DIAGNOSIS — Z66 Do not resuscitate: Secondary | ICD-10-CM | POA: Diagnosis present

## 2018-02-15 DIAGNOSIS — T80219A Unspecified infection due to central venous catheter, initial encounter: Secondary | ICD-10-CM | POA: Diagnosis not present

## 2018-02-15 DIAGNOSIS — R918 Other nonspecific abnormal finding of lung field: Secondary | ICD-10-CM | POA: Diagnosis not present

## 2018-02-15 DIAGNOSIS — D61818 Other pancytopenia: Secondary | ICD-10-CM | POA: Diagnosis not present

## 2018-02-15 DIAGNOSIS — J90 Pleural effusion, not elsewhere classified: Secondary | ICD-10-CM | POA: Diagnosis not present

## 2018-02-15 DIAGNOSIS — E876 Hypokalemia: Secondary | ICD-10-CM | POA: Diagnosis not present

## 2018-02-15 DIAGNOSIS — J9690 Respiratory failure, unspecified, unspecified whether with hypoxia or hypercapnia: Secondary | ICD-10-CM | POA: Diagnosis not present

## 2018-02-15 DIAGNOSIS — R5383 Other fatigue: Secondary | ICD-10-CM | POA: Diagnosis present

## 2018-02-15 DIAGNOSIS — Z981 Arthrodesis status: Secondary | ICD-10-CM

## 2018-02-15 DIAGNOSIS — R05 Cough: Secondary | ICD-10-CM | POA: Diagnosis not present

## 2018-02-15 DIAGNOSIS — Z800 Family history of malignant neoplasm of digestive organs: Secondary | ICD-10-CM

## 2018-02-15 DIAGNOSIS — J9601 Acute respiratory failure with hypoxia: Secondary | ICD-10-CM | POA: Diagnosis not present

## 2018-02-15 DIAGNOSIS — N170 Acute kidney failure with tubular necrosis: Secondary | ICD-10-CM | POA: Diagnosis not present

## 2018-02-15 DIAGNOSIS — Z515 Encounter for palliative care: Secondary | ICD-10-CM | POA: Diagnosis present

## 2018-02-15 DIAGNOSIS — R197 Diarrhea, unspecified: Secondary | ICD-10-CM | POA: Diagnosis not present

## 2018-02-15 DIAGNOSIS — Z8042 Family history of malignant neoplasm of prostate: Secondary | ICD-10-CM

## 2018-02-15 DIAGNOSIS — R110 Nausea: Secondary | ICD-10-CM | POA: Diagnosis not present

## 2018-02-15 DIAGNOSIS — B9689 Other specified bacterial agents as the cause of diseases classified elsewhere: Secondary | ICD-10-CM | POA: Diagnosis present

## 2018-02-15 DIAGNOSIS — J432 Centrilobular emphysema: Secondary | ICD-10-CM | POA: Diagnosis present

## 2018-02-15 DIAGNOSIS — Z7982 Long term (current) use of aspirin: Secondary | ICD-10-CM

## 2018-02-15 DIAGNOSIS — Z79891 Long term (current) use of opiate analgesic: Secondary | ICD-10-CM

## 2018-02-15 DIAGNOSIS — M79671 Pain in right foot: Secondary | ICD-10-CM | POA: Diagnosis not present

## 2018-02-15 DIAGNOSIS — D61810 Antineoplastic chemotherapy induced pancytopenia: Secondary | ICD-10-CM | POA: Diagnosis present

## 2018-02-15 DIAGNOSIS — R0602 Shortness of breath: Secondary | ICD-10-CM

## 2018-02-15 DIAGNOSIS — I959 Hypotension, unspecified: Secondary | ICD-10-CM | POA: Diagnosis not present

## 2018-02-15 DIAGNOSIS — M79673 Pain in unspecified foot: Secondary | ICD-10-CM

## 2018-02-15 DIAGNOSIS — Z452 Encounter for adjustment and management of vascular access device: Secondary | ICD-10-CM | POA: Diagnosis not present

## 2018-02-15 DIAGNOSIS — N178 Other acute kidney failure: Secondary | ICD-10-CM | POA: Diagnosis not present

## 2018-02-15 DIAGNOSIS — Z79899 Other long term (current) drug therapy: Secondary | ICD-10-CM

## 2018-02-15 DIAGNOSIS — N289 Disorder of kidney and ureter, unspecified: Secondary | ICD-10-CM | POA: Diagnosis not present

## 2018-02-15 DIAGNOSIS — R0603 Acute respiratory distress: Secondary | ICD-10-CM | POA: Diagnosis not present

## 2018-02-15 DIAGNOSIS — D72819 Decreased white blood cell count, unspecified: Secondary | ICD-10-CM | POA: Diagnosis not present

## 2018-02-15 DIAGNOSIS — R062 Wheezing: Secondary | ICD-10-CM | POA: Diagnosis not present

## 2018-02-15 LAB — URINALYSIS, ROUTINE W REFLEX MICROSCOPIC
Bilirubin Urine: NEGATIVE
GLUCOSE, UA: NEGATIVE mg/dL
KETONES UR: NEGATIVE mg/dL
Leukocytes, UA: NEGATIVE
Nitrite: NEGATIVE
Protein, ur: 30 mg/dL — AB
SPECIFIC GRAVITY, URINE: 1.019 (ref 1.005–1.030)
pH: 5 (ref 5.0–8.0)

## 2018-02-15 LAB — CBC WITH DIFFERENTIAL/PLATELET
BAND NEUTROPHILS: 5 %
BASOS PCT: 3 %
Basophils Absolute: 0.1 10*3/uL (ref 0.0–0.1)
EOS PCT: 0 %
Eosinophils Absolute: 0 10*3/uL (ref 0.0–0.5)
HEMATOCRIT: 27.4 % — AB (ref 39.0–52.0)
HEMOGLOBIN: 9.1 g/dL — AB (ref 13.0–17.0)
LYMPHS PCT: 16 %
Lymphs Abs: 0.3 10*3/uL — ABNORMAL LOW (ref 0.7–4.0)
MCH: 30.1 pg (ref 26.0–34.0)
MCHC: 33.2 g/dL (ref 30.0–36.0)
MCV: 90.7 fL (ref 80.0–100.0)
MONOS PCT: 13 %
Monocytes Absolute: 0.3 10*3/uL (ref 0.1–1.0)
NRBC: 0 % (ref 0.0–0.2)
Neutro Abs: 1.4 10*3/uL — ABNORMAL LOW (ref 1.7–7.7)
Neutrophils Relative %: 63 %
Platelets: 58 10*3/uL — ABNORMAL LOW (ref 150–400)
RBC: 3.02 MIL/uL — AB (ref 4.22–5.81)
RDW: 16.7 % — AB (ref 11.5–15.5)
WBC: 2.1 10*3/uL — AB (ref 4.0–10.5)

## 2018-02-15 LAB — LACTIC ACID, PLASMA
LACTIC ACID, VENOUS: 2.6 mmol/L — AB (ref 0.5–1.9)
Lactic Acid, Venous: 2.3 mmol/L (ref 0.5–1.9)

## 2018-02-15 LAB — COMPREHENSIVE METABOLIC PANEL
ALK PHOS: 71 U/L (ref 38–126)
ALT: 24 U/L (ref 0–44)
AST: 40 U/L (ref 15–41)
Albumin: 2.6 g/dL — ABNORMAL LOW (ref 3.5–5.0)
Anion gap: 12 (ref 5–15)
BUN: 55 mg/dL — AB (ref 8–23)
CALCIUM: 7.2 mg/dL — AB (ref 8.9–10.3)
CHLORIDE: 92 mmol/L — AB (ref 98–111)
CO2: 20 mmol/L — ABNORMAL LOW (ref 22–32)
Creatinine, Ser: 3.56 mg/dL — ABNORMAL HIGH (ref 0.61–1.24)
GFR, EST AFRICAN AMERICAN: 18 mL/min — AB (ref >60)
GFR, EST NON AFRICAN AMERICAN: 16 mL/min — AB (ref >60)
Glucose, Bld: 90 mg/dL (ref 70–99)
Potassium: 4.2 mmol/L (ref 3.5–5.1)
Sodium: 124 mmol/L — ABNORMAL LOW (ref 135–145)
Total Bilirubin: 1.2 mg/dL (ref 0.3–1.2)
Total Protein: 5.4 g/dL — ABNORMAL LOW (ref 6.5–8.1)

## 2018-02-15 LAB — GLUCOSE, CAPILLARY
Glucose-Capillary: 68 mg/dL — ABNORMAL LOW (ref 70–99)
Glucose-Capillary: 75 mg/dL (ref 70–99)
Glucose-Capillary: 89 mg/dL (ref 70–99)

## 2018-02-15 LAB — PROTIME-INR
INR: 1.27
Prothrombin Time: 15.7 seconds — ABNORMAL HIGH (ref 11.4–15.2)

## 2018-02-15 MED ORDER — IPRATROPIUM-ALBUTEROL 0.5-2.5 (3) MG/3ML IN SOLN
3.0000 mL | Freq: Four times a day (QID) | RESPIRATORY_TRACT | Status: DC
  Administered 2018-02-15 – 2018-02-18 (×12): 3 mL via RESPIRATORY_TRACT
  Filled 2018-02-15 (×13): qty 3

## 2018-02-15 MED ORDER — VANCOMYCIN HCL IN DEXTROSE 1-5 GM/200ML-% IV SOLN
1000.0000 mg | INTRAVENOUS | Status: DC
  Administered 2018-02-16: 1000 mg via INTRAVENOUS
  Filled 2018-02-15 (×2): qty 200

## 2018-02-15 MED ORDER — VANCOMYCIN HCL 10 G IV SOLR
2000.0000 mg | Freq: Once | INTRAVENOUS | Status: AC
  Administered 2018-02-15: 2000 mg via INTRAVENOUS
  Filled 2018-02-15: qty 2000

## 2018-02-15 MED ORDER — FAMOTIDINE IN NACL 20-0.9 MG/50ML-% IV SOLN
20.0000 mg | INTRAVENOUS | Status: DC
  Administered 2018-02-15 – 2018-02-17 (×3): 20 mg via INTRAVENOUS
  Filled 2018-02-15 (×3): qty 50

## 2018-02-15 MED ORDER — SODIUM CHLORIDE 0.9 % IV BOLUS (SEPSIS)
1000.0000 mL | Freq: Once | INTRAVENOUS | Status: AC
  Administered 2018-02-15: 1000 mL via INTRAVENOUS

## 2018-02-15 MED ORDER — SODIUM CHLORIDE 0.9 % IV SOLN
250.0000 mL | INTRAVENOUS | Status: DC
  Administered 2018-02-18: 250 mL via INTRAVENOUS

## 2018-02-15 MED ORDER — PIPERACILLIN-TAZOBACTAM 3.375 G IVPB
3.3750 g | Freq: Three times a day (TID) | INTRAVENOUS | Status: DC
  Administered 2018-02-15 – 2018-02-17 (×5): 3.375 g via INTRAVENOUS
  Filled 2018-02-15 (×6): qty 50

## 2018-02-15 MED ORDER — SODIUM CHLORIDE 0.9 % IV SOLN
INTRAVENOUS | Status: DC
  Administered 2018-02-15: 21:00:00 via INTRAVENOUS

## 2018-02-15 MED ORDER — NOREPINEPHRINE 4 MG/250ML-% IV SOLN
5.0000 ug/min | INTRAVENOUS | Status: DC
  Filled 2018-02-15: qty 250

## 2018-02-15 MED ORDER — SODIUM CHLORIDE 0.9 % IV SOLN
250.0000 mL | INTRAVENOUS | Status: DC
  Administered 2018-02-17 – 2018-02-20 (×5): 250 mL via INTRAVENOUS

## 2018-02-15 MED ORDER — NOREPINEPHRINE BITARTRATE 1 MG/ML IV SOLN: 0.0400 ug/kg/min | Freq: Once | INTRAVENOUS | Status: DC

## 2018-02-15 MED ORDER — NOREPINEPHRINE 4 MG/250ML-% IV SOLN
4.0000 ug/min | INTRAVENOUS | Status: DC
  Filled 2018-02-15: qty 250

## 2018-02-15 MED ORDER — SODIUM CHLORIDE 0.9 % IV SOLN: 2.0000 g | Freq: Once | INTRAVENOUS | Status: DC

## 2018-02-15 MED ORDER — SODIUM CHLORIDE 0.9 % IV SOLN
2.0000 g | Freq: Once | INTRAVENOUS | Status: AC
  Administered 2018-02-15: 2 g via INTRAVENOUS
  Filled 2018-02-15: qty 2

## 2018-02-15 MED ORDER — SODIUM CHLORIDE 0.9 % IV SOLN
Freq: Once | INTRAVENOUS | Status: AC
  Administered 2018-02-15: 16:00:00 via INTRAVENOUS

## 2018-02-15 MED ORDER — SODIUM CHLORIDE 0.9 % IV SOLN: INTRAVENOUS | Status: DC

## 2018-02-15 NOTE — ED Notes (Signed)
Call from lab   Critical lactic acid  2.6  Dr C informed

## 2018-02-15 NOTE — ED Provider Notes (Addendum)
[facility] EMERGENCY DEPARTMENT Provider Note   CSN: [id number] Arrival date & time: 02/20/2018  1241     History   Chief Complaint Chief Complaint  Patient presents with  . Fatigue    HPI Chris Lopez is a 72 y.o. male.  Level of caveat for acuity of condition.  Patient has a known history of mantle cell lymphoma and is being treated locally at [facility].  He has developed a fever for approximately 2 days and general lethargy/malaise.  Initial blood pressure in the 70s.  Gluc 108.  Wife reports that he normally gets around quite well.  Is scheduled for a consult at [facility] on Monday.     Past Medical History:  Diagnosis Date  . Anxiety   . Arthritis   . BPH (benign prostatic hyperplasia)   . Chronic bronchitis   . COPD (chronic obstructive pulmonary disease) ([place])   . Depression   . Diabetes mellitus without complication ([place])    Resolved; not needing medications at this time.  Marland Kitchen GERD (gastroesophageal reflux disease)   . Hypertension   . Lymphoma, mantle cell [facility])     Patient Active Problem List   Diagnosis Date Noted  . Lymphoma, mantle cell ([place]) 10/17/2017  . GERD (gastroesophageal reflux disease) 07/24/2017  . Constipation 07/24/2017  . RLQ abdominal pain 07/24/2017  . RUQ pain 07/24/2017  . DM (diabetes mellitus) ([place]) 10/13/2012  . Mixed hyperlipidemia 10/13/2012  . Depressive disorder, not elsewhere classified 10/13/2012  . Cramp of limb 10/13/2012  . Pain in joint, lower leg 10/13/2012  . Other chest pain 10/13/2012  . LUQ abdominal pain 07/24/2010  . Encounter for colorectal cancer screening 07/24/2010  . OBESITY 05/03/2009  . TOBACCO USER 05/03/2009  . Essential hypertension 05/03/2009  . CAD, NATIVE VESSEL 05/03/2009  . ANGINA, HX OF 05/03/2009    Past Surgical History:  Procedure Laterality Date  . BIOPSY  10/07/2017   Procedure: BIOPSY;  Surgeon: Daneil Dolin, MD;  Location: [facility] ENDO SUITE;  Service: Endoscopy;;   gastroesophageal junction bx ileocecal valve  . carpal tunnel right hand    . CERVICAL FUSION    . COLONOSCOPY WITH PROPOFOL N/A 10/07/2017   Procedure: COLONOSCOPY WITH PROPOFOL;  Surgeon: Daneil Dolin, MD;  Location: [facility] ENDO SUITE;  Service: Endoscopy;  Laterality: N/A;  10:00am  . ESOPHAGOGASTRODUODENOSCOPY (EGD) WITH PROPOFOL N/A 10/07/2017   Procedure: ESOPHAGOGASTRODUODENOSCOPY (EGD) WITH PROPOFOL;  Surgeon: Daneil Dolin, MD;  Location: [facility] ENDO SUITE;  Service: Endoscopy;  Laterality: N/A;  . IR IMAGING GUIDED PORT INSERTION  10/30/2017  . left knee replacement    . left knee revised X 3    . multiple knee arthroscopies    . POLYPECTOMY  10/07/2017   Procedure: POLYPECTOMY;  Surgeon: Daneil Dolin, MD;  Location: [facility] ENDO SUITE;  Service: Endoscopy;;  colon  . right knee replacement    . tarsal tunnel repair    . THYROIDECTOMY, PARTIAL    . TONSILLECTOMY          Home Medications    Prior to Admission medications   Medication Sig Start Date End Date Taking? Authorizing Provider  acyclovir (ZOVIRAX) 400 MG tablet Take 1 tablet (400 mg total) by mouth 2 (two) times daily. 01/28/18   Baird Cancer, PA-C  albuterol (PROVENTIL HFA;VENTOLIN HFA) 108 (90 Base) MCG/ACT inhaler Inhale 2 puffs into the lungs every 6 (six) hours as needed for wheezing or shortness of breath.    [provider]  allopurinol (ZYLOPRIM) 300 MG tablet Take 1 tablet (300 mg total) by mouth daily. 10/31/17   Derek Jack, MD  amLODipine (NORVASC) 5 MG tablet Take 5 mg by mouth every evening. 07/01/17   [provider]  aspirin 325 MG tablet Take 325 mg by mouth daily.     [provider]  atorvastatin (LIPITOR) 20 MG tablet Take 20 mg by mouth every evening. Reported on 06/07/2015    [provider]  bendamustine in sodium chloride 0.9 % 50 mL Inject into the vein once.    [provider]  diclofenac (VOLTAREN) 75 MG EC tablet TAKE 1 TABLET TWICE DAILY  02/18/17   Sanjuana Kava, MD  diphenhydrAMINE (BENADRYL) 25 MG tablet Take 1 tablet (25 mg total) by mouth every 6 (six) hours as needed for itching. 12/30/17   Higgs, Mathis Dad, MD  gabapentin (NEURONTIN) 600 MG tablet Take 600 mg by mouth 3 (three) times daily.    [provider]  lidocaine-prilocaine (EMLA) cream Apply to affected area once 10/31/17   Derek Jack, MD  LORazepam (ATIVAN) 2 MG tablet Take 2 mg by mouth at bedtime. 08/15/17   [provider]  metoprolol (LOPRESSOR) 50 MG tablet Take 50 mg by mouth every evening.     [provider]  nitroGLYCERIN (NITROSTAT) 0.4 MG SL tablet Place 0.4 mg under the tongue every 5 (five) minutes x 3 doses as needed. For chest pain    [provider]  omeprazole (PRILOSEC OTC) 20 MG tablet Take 20 mg by mouth 2 (two) times daily. Reported on 06/07/2015    [provider]  oxyCODONE-acetaminophen (PERCOCET) 7.5-325 MG tablet Take 1 tablet by mouth every 4 (four) hours as needed. 02/06/18   Sanjuana Kava, MD  prochlorperazine (COMPAZINE) 10 MG tablet Take 1 tablet (10 mg total) by mouth every 6 (six) hours as needed (Nausea or vomiting). 10/31/17   Derek Jack, MD  riTUXimab (RITUXAN IV) Inject into the vein.    [provider]  Tamsulosin HCl (FLOMAX) 0.4 MG CAPS Take 0.4 mg by mouth daily. Reported on 06/07/2015    [provider]  venlafaxine (EFFEXOR-XR) 150 MG 24 hr capsule Take 150 mg by mouth every evening.     [provider]    Family History Family History  Problem Relation Age of Onset  . Colon cancer Maternal Grandmother   . Colon cancer Paternal Grandmother   . Heart disease Mother   . Arthritis Mother   . Prostate cancer Father   . Heart attack Brother   . Heart disease Maternal Aunt   . Cancer Maternal Aunt   . Heart disease Maternal Uncle   . Cancer Maternal Uncle   . Heart disease Paternal Aunt   . Heart disease Paternal Uncle   . Heart disease  Brother   . Heart attack Brother   . Arthritis Brother     Social History Social History   Tobacco Use  . Smoking status: Current Every Day Smoker    Packs/day: 1.25    Years: 50.00    Pack years: 62.50    Types: Cigarettes  . Smokeless tobacco: Never Used  Substance Use Topics  . Alcohol use: No  . Drug use: No     Allergies   Bee venom and Feldene [piroxicam]   Review of Systems Review of Systems  All other systems reviewed and are negative.    Physical Exam Updated Vital Signs BP (!) 74/60   Pulse  81   Temp (!) 97.2 F (36.2 C) (Rectal)   Resp 14   Ht 6' (1.829 m)   Wt 95.3 kg   SpO2 91%   BMI 28.48 kg/m   Physical Exam  Constitutional:  Pale, appears ill, attempts to answer questions.  HENT:  Head: Normocephalic and atraumatic.  Eyes: Conjunctivae are normal.  Neck: Neck supple.  Cardiovascular: Normal rate and regular rhythm.  Pulmonary/Chest: Effort normal and breath sounds normal.  Abdominal: Soft. Bowel sounds are normal.  Musculoskeletal:  Unable  Neurological:  Sleepy, but arousable  Skin: Skin is warm and dry.  Psychiatric: He has a normal mood and affect. His behavior is normal.  Nursing note and vitals reviewed.    ED Treatments / Results  Labs (all labs ordered are listed, but only abnormal results are displayed) Labs Reviewed  COMPREHENSIVE METABOLIC PANEL - Abnormal; Notable for the following components:      Result Value   Sodium 124 (*)    Chloride 92 (*)    CO2 20 (*)    BUN 55 (*)    Creatinine, Ser 3.56 (*)    Calcium 7.2 (*)    Total Protein 5.4 (*)    Albumin 2.6 (*)    GFR calc non Af Amer 16 (*)    GFR calc Af Amer 18 (*)    All other components within normal limits  CBC WITH DIFFERENTIAL/PLATELET - Abnormal; Notable for the following components:   WBC 2.1 (*)    RBC 3.02 (*)    Hemoglobin 9.1 (*)    HCT 27.4 (*)    RDW 16.7 (*)    Platelets 58 (*)    Neutro Abs 1.4 (*)    Lymphs Abs 0.3 (*)    All  other components within normal limits  PROTIME-INR - Abnormal; Notable for the following components:   Prothrombin Time 15.7 (*)    All other components within normal limits  URINALYSIS, ROUTINE W REFLEX MICROSCOPIC - Abnormal; Notable for the following components:   Color, Urine AMBER (*)    APPearance CLOUDY (*)    Hgb urine dipstick SMALL (*)    Protein, ur 30 (*)    Bacteria, UA RARE (*)    All other components within normal limits  LACTIC ACID, PLASMA - Abnormal; Notable for the following components:   Lactic Acid, Venous 2.3 (*)    All other components within normal limits  LACTIC ACID, PLASMA - Abnormal; Notable for the following components:   Lactic Acid, Venous 2.6 (*)    All other components within normal limits  CULTURE, BLOOD (ROUTINE X 2)  CULTURE, BLOOD (ROUTINE X 2)    EKG EKG Interpretation  Date/Time:  Saturday February 15 2018 12:43:06 EST Ventricular Rate:  72 PR Interval:    QRS Duration: 91 QT Interval:  422 QTC Calculation: 462 R Axis:   67 Text Interpretation:  Sinus rhythm Ventricular premature complex Aberrant conduction of SV complex(es) Low voltage, precordial leads ST elevation, consider inferior injury Confirmed by Nat Christen [phone]) on 02/08/2018 2:36:35 PM   Radiology Dg Chest Port 1 View  Result Date: 03/02/2018 CLINICAL DATA:  Cough for several days. History of non-Hodgkin's lymphoma. Chemotherapy 2 weeks ago. EXAM: PORTABLE CHEST 1 VIEW COMPARISON:  PET-CT, 10/28/2017.  Chest radiographs, 06/29/2010. FINDINGS: Cardiac silhouette mildly enlarged.  No mediastinal or hilar masses. Lung volumes are low. There are prominent bronchovascular markings. No evidence of pneumonia or pulmonary edema. No pleural effusion or pneumothorax. Right anterior chest  wall internal jugular power Port-A-Cath tip projects in the right atrium. No acute skeletal abnormality. IMPRESSION: No acute cardiopulmonary disease. Electronically Signed   By: Lajean Manes M.D.   On:  02/21/2018 14:11    Procedures Procedures (including critical care time)  Medications Ordered in ED Medications  0.9 %  sodium chloride infusion (has no administration in time range)  0.9 %  sodium chloride infusion (has no administration in time range)  sodium chloride 0.9 % bolus 1,000 mL (0 mLs Intravenous Stopped 02/10/2018 1328)    And  sodium chloride 0.9 % bolus 1,000 mL (0 mLs Intravenous Stopped 02/16/2018 1355)    And  sodium chloride 0.9 % bolus 1,000 mL (0 mLs Intravenous Stopped 03/03/2018 1457)  ceFEPIme (MAXIPIME) 2 g in sodium chloride 0.9 % 100 mL IVPB (0 g Intravenous Stopped 02/13/2018 1406)  vancomycin (VANCOCIN) 2,000 mg in sodium chloride 0.9 % 500 mL IVPB (2,000 mg Intravenous New Bag/Given 02/22/2018 1406)     Initial Impression / Assessment and Plan / ED Course  I have reviewed the triage vital signs and the nursing notes.  Pertinent labs & imaging results that were available during my care of the patient were reviewed by me and considered in my medical decision making (see chart for details).     Patient with mantle cell lymphoma presents with fever and generalized malaise for 2 days.  Sepsis protocol initiated.  He is pancytopenic.  Creatinine elevated.  Will initiate IV antibiotics, pressors, attempt to transfer to [facility].  1630: Will initiate levophed for blood pressure control  1720:   Discussed with critical care physician.  He will accept for transfer to [place] Performed by: Nat Christen Total critical care time: 45 minutes Critical care time was exclusive of separately billable procedures and treating other patients. Critical care was necessary to treat or prevent imminent or life-threatening deterioration. Critical care was time spent personally by me on the following activities: development of treatment plan with patient and/or surrogate as well as nursing, discussions with consultants, evaluation of patient's response to treatment,  examination of patient, obtaining history from patient or surrogate, ordering and performing treatments and interventions, ordering and review of laboratory studies, ordering and review of radiographic studies, pulse oximetry and re-evaluation of patient's condition.  Final Clinical Impressions(s) / ED Diagnoses   Final diagnoses:  Sepsis, due to unspecified organism, unspecified whether acute organ dysfunction present ([place])  Mantle cell lymphoma, unspecified body region ([place])  Pancytopenia ([place])  Acute renal failure, unspecified acute renal failure type [facility])    ED Discharge Orders    None       Nat Christen, MD 02/10/2018 1616    Nat Christen, MD 03/03/2018 1722

## 2018-02-15 NOTE — ED Notes (Signed)
Dr Lacinda Axon in.  Verbal order given to hold Levophed.

## 2018-02-15 NOTE — Progress Notes (Signed)
Pharmacy Note:  Initial antibiotic(s) regimen of Vancomycin and Cefepime ordered by EDP to treat sepsis.  Estimated Creatinine Clearance: 22.8 mL/min (A) (by C-G formula based on SCr of 3.56 mg/dL (H)).   Allergies  Allergen Reactions  . Bee Venom Anaphylaxis    Allergic to bee stings not the kit  . Feldene [Piroxicam] Rash and Dermatitis    Vitals:   02/12/2018 1330 03/07/2018 1400  BP: (!) 72/61 (!) 74/57  Pulse: 72 76  Resp: 17 15  Temp:    SpO2: 92% (!) 89%    Anti-infectives (From admission, onward)   Start     Dose/Rate Route Frequency Ordered Stop   02/08/2018 1330  ceFEPIme (MAXIPIME) 2 g in sodium chloride 0.9 % 100 mL IVPB     2 g 200 mL/hr over 30 Minutes Intravenous  Once 02/14/2018 1315 02/24/2018 1406   02/07/2018 1330  vancomycin (VANCOCIN) 2,000 mg in sodium chloride 0.9 % 500 mL IVPB     2,000 mg 250 mL/hr over 120 Minutes Intravenous  Once 02/25/2018 1315     02/12/2018 1315  ceFEPIme (MAXIPIME) 2 g in sodium chloride 0.9 % 100 mL IVPB  Status:  Discontinued     2 g 200 mL/hr over 30 Minutes Intravenous  Once 03/08/2018 1304 03/02/2018 1308     Plan: Initial dose(s) of vancomycin 2067m IV and cefepime 2gm IV X 1 ordered. F/U admission orders for further dosing if therapy continued.  PCristy Friedlander RChrist[facility]11/12/2017 2:35 PM

## 2018-02-15 NOTE — Progress Notes (Signed)
Pharmacy Antibiotic Note  Chris Lopez is a 72 y.o. male admitted on 02/24/2018 with sepsis.  Pharmacy has been consulted for Vancomycin / Zosyn dosing.  Initial doses given at [facility]: Vancomycin 1 gram iv Q 24 hours Zosyn 3.375 grams iv Q 8 hours - 4 hr infusion  Height: 6' (182.9 cm) Weight: 210 lb (95.3 kg) IBW/kg (Calculated) : 77.6  Temp (24hrs), Avg:97.4 F (36.3 C), Min:97.2 F (36.2 C), Max:97.6 F (36.4 C)  Recent Labs  Lab 03/08/2018 1253 02/12/2018 1315 02/20/2018 1512  WBC 2.1*  --   --   CREATININE 3.56*  --   --   LATICACIDVEN  --  2.3* 2.6*    Estimated Creatinine Clearance: 22.8 mL/min (A) (by C-G formula based on SCr of 3.56 mg/dL (H)).    Allergies  Allergen Reactions  . Bee Venom Anaphylaxis    Allergic to bee stings not the kit  . Feldene [Piroxicam] Rash and Dermatitis     Thank you for allowing pharmacy to be a part of this patient's care. Anette Guarneri, PharmD 02/19/2018 8:01 PM

## 2018-02-15 NOTE — ED Notes (Signed)
Dr Lacinda Axon and Dr Thurnell Garbe informed of possible code sepsis.

## 2018-02-15 NOTE — ED Notes (Signed)
Accessed right Implanted port, brisk blood return.  Wasted 10 ml of blood from port.

## 2018-02-15 NOTE — ED Triage Notes (Signed)
Cough few days.  No n/v.  History of Non Hodgkins Lymphoma.  chemotherapy 2 weeks ago.  Fever on yesterday, relief with tylenol.  SBP in 70's for EMS, given 1000 ml bolus and 500 ml hanging.  CBG 108.

## 2018-02-15 NOTE — ED Notes (Signed)
CRITICAL VALUE ALERT  Critical Value:  Lactic Acid 2.3  Date & Time Notied:  02/25/2018  Provider Notified: Dr Lacinda Axon  Orders Received/Actions taken: see new orders.

## 2018-02-15 NOTE — H&P (Signed)
PULMONARY / CRITICAL CARE MEDICINE   NAME:  TALTON DELPRIORE, MRN:  [id number], DOB:  1945/11/09, LOS: 0 ADMISSION DATE:  02/08/2018, CONSULTATION DATE: 02/25/2018 REFERRING MD: [facility] emergency room, CHIEF COMPLAINT: Body aches  BRIEF HISTORY:    Mr. Hettich 72 year old male with history of mantle cell lymphoma undergoing chemotherapy coming in with generalized body aches and hypotension from Ativan.  HISTORY OF PRESENT ILLNESS   72 year old male with history of mantle cell lymphoma undergoing chemotherapy went to [facility] emergency room due to complaint of generalized body aches and fever for 2 days.  Patient states that he has been having some chest congestion.  Denies any dysuria no abdominal pain does have irritable bowel between constipation and diarrhea no vomiting.  Patient denies any chest pain but does have some mild shortness of breath.  Patient did receive more than 3 L of IV fluid boluses and antipain emergency room due to hypotension and was brought in for direct admission and further management.     SIGNIFICANT EVENTS:  Admission to [facility] as a direct admission from an apparent on 02/13/2018 STUDIES:    CULTURES:  Blood culture>>  ANTIBIOTICS:  Zosyn 02/21/2018 Vancomycin 02/22/2018  LINES/TUBES:    CONSULTANTS:   SUBJECTIVE:    CONSTITUTIONAL: BP 90/72 (BP Location: Left Arm)   Pulse 94   Temp 97.6 F (36.4 C) (Oral)   Resp 14   Ht 6' (1.829 m)   Wt 95.3 kg   SpO2 90%   BMI 28.48 kg/m   I/O last 3 completed shifts: In: JAARS [IV Piggyback:3600] Out: -         PHYSICAL EXAM: General: Looks acutely ill toxic looking Neuro: Awake alert cranial nerves 1- 12 are intact power 5/5 all over HEENT: Moist mucous membranes Cardiovascular: Normal sinus sounds or murmurs Lungs: Coarse crackles bilaterally no wheezing or dullness Abdomen: Soft no tenderness no organomegaly Musculoskeletal: No lower limb edema Skin: Diffuse petechial rash  RESOLVED  PROBLEM LIST   ASSESSMENT AND PLAN   Assessment -Septic shock -Febrile neutropenia -Mantle cell lymphoma -Acute kidney injury -Hyponatremia -Chronic obstructive pulmonary disease -Lactic acidosis   Plan: -Zosyn vancomycin -Patient is getting IV fluid to 50 mL an hour but sounds congested I will drop at 75 from an hour -Start Levophed and titrate to keep map above 65 -Follow urine output and renal function -Follow sodium levels -Follow blood cultures -DuoNeb -SCDs for DVT prophylaxis -GI prophylaxis  I have spent 40 minutes of critical care time this time was spent bedside or in the unit this time was exclusive of any billable procedures patient is needing intensive care due to septic shock requiring vasopressors   SUMMARY OF TODAY'S PLAN:    Best Practice / Goals of Care / Disposition.   DVT PROPHYLAXIS: SCDs SUP: PPI NUTRITION: Diabetic diet MOBILITY: Bedrest GOALS OF CARE: Full code FAMILY DISCUSSIONS: No family around [place] ICU admission  LABS  Glucose No results for input(s): GLUCAP in the last 168 hours.  BMET Recent Labs  Lab 03/07/2018 1253  NA 124*  K 4.2  CL 92*  CO2 20*  BUN 55*  CREATININE 3.56*  GLUCOSE 90    Liver Enzymes Recent Labs  Lab 02/11/2018 1253  AST 40  ALT 24  ALKPHOS 71  BILITOT 1.2  ALBUMIN 2.6*    Electrolytes Recent Labs  Lab 02/14/2018 1253  CALCIUM 7.2*    CBC Recent Labs  Lab 03/06/2018 1253  WBC 2.1*  HGB 9.1*  HCT  27.4*  PLT 58*    ABG No results for input(s): PHART, PCO2ART, PO2ART in the last 168 hours.  Coag's Recent Labs  Lab 02/23/2018 1253  INR 1.27    Sepsis Markers Recent Labs  Lab 02/10/2018 1315 03/04/2018 1512  LATICACIDVEN 2.3* 2.6*    Cardiac Enzymes No results for input(s): TROPONINI, PROBNP in the last 168 hours.  PAST MEDICAL HISTORY :   He  has a past medical history of Anxiety, Arthritis, BPH (benign prostatic hyperplasia), Chronic bronchitis, COPD (chronic obstructive  pulmonary disease) ([place]), Depression, Diabetes mellitus without complication ([place]), GERD (gastroesophageal reflux disease), Hypertension, and Lymphoma, mantle cell ([place]).  PAST SURGICAL HISTORY:  He  has a past surgical history that includes left knee replacement; left knee revised X 3; right knee replacement; tarsal tunnel repair; carpal tunnel right hand; Cervical fusion; Thyroidectomy, partial; Tonsillectomy; multiple knee arthroscopies; Colonoscopy with propofol (N/A, 10/07/2017); Esophagogastroduodenoscopy (egd) with propofol (N/A, 10/07/2017); biopsy (10/07/2017); polypectomy (10/07/2017); and IR IMAGING GUIDED PORT INSERTION (10/30/2017).  Allergies  Allergen Reactions  . Bee Venom Anaphylaxis    Allergic to bee stings not the kit  . Feldene [Piroxicam] Rash and Dermatitis    No current facility-administered medications on file prior to encounter.    Current Outpatient Medications on File Prior to Encounter  Medication Sig  . acyclovir (ZOVIRAX) 400 MG tablet Take 1 tablet (400 mg total) by mouth 2 (two) times daily.  Marland Kitchen albuterol (PROVENTIL HFA;VENTOLIN HFA) 108 (90 Base) MCG/ACT inhaler Inhale 2 puffs into the lungs every 6 (six) hours as needed for wheezing or shortness of breath.  . allopurinol (ZYLOPRIM) 300 MG tablet Take 1 tablet (300 mg total) by mouth daily.  Marland Kitchen amLODipine (NORVASC) 5 MG tablet Take 5 mg by mouth every evening.  Marland Kitchen aspirin 325 MG tablet Take 325 mg by mouth daily.   Marland Kitchen atorvastatin (LIPITOR) 20 MG tablet Take 20 mg by mouth every evening. Reported on 06/07/2015  . bendamustine in sodium chloride 0.9 % 50 mL Inject into the vein once.  . diclofenac (VOLTAREN) 75 MG EC tablet TAKE 1 TABLET TWICE DAILY  . diphenhydrAMINE (BENADRYL) 25 MG tablet Take 1 tablet (25 mg total) by mouth every 6 (six) hours as needed for itching.  . gabapentin (NEURONTIN) 600 MG tablet Take 600 mg by mouth 3 (three) times daily.  Marland Kitchen lidocaine-prilocaine (EMLA) cream Apply to affected area once  .  LORazepam (ATIVAN) 2 MG tablet Take 2 mg by mouth at bedtime.  . metoprolol (LOPRESSOR) 50 MG tablet Take 50 mg by mouth every evening.   . nitroGLYCERIN (NITROSTAT) 0.4 MG SL tablet Place 0.4 mg under the tongue every 5 (five) minutes x 3 doses as needed. For chest pain  . omeprazole (PRILOSEC OTC) 20 MG tablet Take 20 mg by mouth 2 (two) times daily. Reported on 06/07/2015  . oxyCODONE-acetaminophen (PERCOCET) 7.5-325 MG tablet Take 1 tablet by mouth every 4 (four) hours as needed.  . prochlorperazine (COMPAZINE) 10 MG tablet Take 1 tablet (10 mg total) by mouth every 6 (six) hours as needed (Nausea or vomiting).  . riTUXimab (RITUXAN IV) Inject into the vein.  . Tamsulosin HCl (FLOMAX) 0.4 MG CAPS Take 0.4 mg by mouth daily. Reported on 06/07/2015  . venlafaxine (EFFEXOR-XR) 150 MG 24 hr capsule Take 150 mg by mouth every evening.     FAMILY HISTORY:   His family history includes Arthritis in his brother and mother; Cancer in his maternal aunt and maternal uncle; Colon cancer in his  maternal grandmother and paternal grandmother; Heart attack in his brother and brother; Heart disease in his brother, maternal aunt, maternal uncle, mother, paternal aunt, and paternal uncle; Prostate cancer in his father.  SOCIAL HISTORY:  He  reports that he has been smoking cigarettes. He has a 62.50 pack-year smoking history. He has never used smokeless tobacco. He reports that he does not drink alcohol or use drugs.  REVIEW OF SYSTEMS:    All 11 point system review were unremarkable other than what is mentioned history present illness

## 2018-02-16 ENCOUNTER — Inpatient Hospital Stay (HOSPITAL_COMMUNITY): Payer: Medicare HMO

## 2018-02-16 DIAGNOSIS — J9600 Acute respiratory failure, unspecified whether with hypoxia or hypercapnia: Secondary | ICD-10-CM

## 2018-02-16 LAB — GLUCOSE, CAPILLARY
GLUCOSE-CAPILLARY: 89 mg/dL (ref 70–99)
GLUCOSE-CAPILLARY: 113 mg/dL — AB (ref 70–99)
Glucose-Capillary: 97 mg/dL (ref 70–99)

## 2018-02-16 LAB — RESPIRATORY PANEL BY PCR
ADENOVIRUS-RVPPCR: NOT DETECTED
Bordetella pertussis: NOT DETECTED
CHLAMYDOPHILA PNEUMONIAE-RVPPCR: NOT DETECTED
CORONAVIRUS OC43-RVPPCR: NOT DETECTED
Coronavirus 229E: NOT DETECTED
Coronavirus HKU1: NOT DETECTED
Coronavirus NL63: NOT DETECTED
INFLUENZA A-RVPPCR: NOT DETECTED
INFLUENZA B-RVPPCR: NOT DETECTED
Metapneumovirus: NOT DETECTED
Mycoplasma pneumoniae: NOT DETECTED
PARAINFLUENZA VIRUS 1-RVPPCR: NOT DETECTED
Parainfluenza Virus 2: NOT DETECTED
Parainfluenza Virus 3: NOT DETECTED
Parainfluenza Virus 4: NOT DETECTED
RHINOVIRUS / ENTEROVIRUS - RVPPCR: NOT DETECTED
Respiratory Syncytial Virus: NOT DETECTED

## 2018-02-16 LAB — BASIC METABOLIC PANEL
ANION GAP: 12 (ref 5–15)
BUN: 58 mg/dL — ABNORMAL HIGH (ref 8–23)
CO2: 18 mmol/L — ABNORMAL LOW (ref 22–32)
CREATININE: 3.37 mg/dL — AB (ref 0.61–1.24)
Calcium: 7.1 mg/dL — ABNORMAL LOW (ref 8.9–10.3)
Chloride: 97 mmol/L — ABNORMAL LOW (ref 98–111)
GFR, EST AFRICAN AMERICAN: 20 mL/min — AB (ref >60)
GFR, EST NON AFRICAN AMERICAN: 17 mL/min — AB (ref >60)
GLUCOSE: 94 mg/dL (ref 70–99)
Potassium: 3.7 mmol/L (ref 3.5–5.1)
Sodium: 127 mmol/L — ABNORMAL LOW (ref 135–145)

## 2018-02-16 LAB — BLOOD CULTURE ID PANEL (REFLEXED)
Acinetobacter baumannii: NOT DETECTED
CANDIDA GLABRATA: NOT DETECTED
CANDIDA TROPICALIS: NOT DETECTED
Candida albicans: NOT DETECTED
Candida krusei: NOT DETECTED
Candida parapsilosis: NOT DETECTED
ENTEROBACTER CLOACAE COMPLEX: NOT DETECTED
ENTEROCOCCUS SPECIES: NOT DETECTED
Enterobacteriaceae species: NOT DETECTED
Escherichia coli: NOT DETECTED
HAEMOPHILUS INFLUENZAE: NOT DETECTED
Klebsiella oxytoca: NOT DETECTED
Klebsiella pneumoniae: NOT DETECTED
LISTERIA MONOCYTOGENES: NOT DETECTED
METHICILLIN RESISTANCE: DETECTED — AB
NEISSERIA MENINGITIDIS: NOT DETECTED
PROTEUS SPECIES: NOT DETECTED
Pseudomonas aeruginosa: NOT DETECTED
STAPHYLOCOCCUS AUREUS BCID: DETECTED — AB
STREPTOCOCCUS SPECIES: NOT DETECTED
Serratia marcescens: NOT DETECTED
Staphylococcus species: DETECTED — AB
Streptococcus agalactiae: NOT DETECTED
Streptococcus pneumoniae: NOT DETECTED
Streptococcus pyogenes: NOT DETECTED

## 2018-02-16 LAB — TROPONIN I
TROPONIN I: 0.24 ng/mL — AB (ref <0.03)
Troponin I: 0.25 ng/mL (ref <0.03)

## 2018-02-16 LAB — LACTIC ACID, PLASMA: Lactic Acid, Venous: 1.3 mmol/L (ref 0.5–1.9)

## 2018-02-16 LAB — CBC
HCT: 25.8 % — ABNORMAL LOW (ref 39.0–52.0)
Hemoglobin: 8.9 g/dL — ABNORMAL LOW (ref 13.0–17.0)
MCH: 30.8 pg (ref 26.0–34.0)
MCHC: 34.5 g/dL (ref 30.0–36.0)
MCV: 89.3 fL (ref 80.0–100.0)
NRBC: 0 % (ref 0.0–0.2)
PLATELETS: 51 10*3/uL — AB (ref 150–400)
RBC: 2.89 MIL/uL — ABNORMAL LOW (ref 4.22–5.81)
RDW: 16.7 % — ABNORMAL HIGH (ref 11.5–15.5)
WBC: 1.2 10*3/uL — AB (ref 4.0–10.5)

## 2018-02-16 LAB — STREP PNEUMONIAE URINARY ANTIGEN: STREP PNEUMO URINARY ANTIGEN: NEGATIVE

## 2018-02-16 LAB — CORTISOL: CORTISOL PLASMA: 37.4 ug/dL

## 2018-02-16 MED ORDER — FUROSEMIDE 10 MG/ML IJ SOLN
40.0000 mg | Freq: Once | INTRAMUSCULAR | Status: AC
  Administered 2018-02-16: 40 mg via INTRAVENOUS
  Filled 2018-02-16: qty 4

## 2018-02-16 MED ORDER — ORAL CARE MOUTH RINSE
15.0000 mL | Freq: Two times a day (BID) | OROMUCOSAL | Status: DC
  Administered 2018-02-16 – 2018-02-20 (×9): 15 mL via OROMUCOSAL

## 2018-02-16 MED ORDER — OXYCODONE HCL 5 MG PO TABS
2.5000 mg | ORAL_TABLET | ORAL | Status: DC | PRN
  Administered 2018-02-16 – 2018-02-19 (×3): 2.5 mg via ORAL
  Filled 2018-02-16 (×3): qty 1

## 2018-02-16 MED ORDER — ACETAMINOPHEN 325 MG PO TABS
650.0000 mg | ORAL_TABLET | Freq: Four times a day (QID) | ORAL | Status: DC | PRN
  Administered 2018-02-16 – 2018-02-18 (×2): 650 mg via ORAL
  Filled 2018-02-16 (×2): qty 2

## 2018-02-16 MED ORDER — FUROSEMIDE 10 MG/ML IJ SOLN: 40.0000 mg | Freq: Two times a day (BID) | INTRAMUSCULAR | Status: DC

## 2018-02-16 MED ORDER — OXYCODONE-ACETAMINOPHEN 7.5-325 MG PO TABS: 1.0000 | ORAL_TABLET | ORAL | Status: DC | PRN

## 2018-02-16 MED ORDER — OXYCODONE-ACETAMINOPHEN 5-325 MG PO TABS
1.0000 | ORAL_TABLET | ORAL | Status: DC | PRN
  Administered 2018-02-16 – 2018-02-19 (×8): 1 via ORAL
  Filled 2018-02-16 (×8): qty 1

## 2018-02-16 NOTE — Progress Notes (Signed)
Patient on 4l [place] 02 sat= 94%.  Work of breathing is heavy with lower oral ronchi.  Patient very restless.  0300.  Work of breathing increasing.  Patient tachy, tachypnic and 02 sat=83.  Non rebreather applied.  02 sat rising steadily.  MD summer called for chest xray related to increased oral ronchi.  Xray revealed the patient is wet,  Lasix ordered and given.  02=93%  Continuing to monitor

## 2018-02-16 NOTE — Plan of Care (Signed)
progressing 

## 2018-02-16 NOTE — Progress Notes (Signed)
CRITICAL VALUE ALERT  Critical Value:  Gram (+) Blood cx in anaerobic bottle  Date & Time Notied:  02/16/18 0825  Provider Notified: Mann[place]  Orders Received/Actions taken: Continue abx

## 2018-02-16 NOTE — Progress Notes (Signed)
PHARMACY - PHYSICIAN COMMUNICATION CRITICAL VALUE ALERT - BLOOD CULTURE IDENTIFICATION (BCID)  Chris Lopez is an 72 y.o. male who presented to [facility] on 02/12/2018 with a chief complaint of body aches  Name of physician (or Provider) Contacted: Mannam  Current antibiotics: Vancomycin + zosyn  Changes to prescribed antibiotics recommended:  Continue current regimen  Results for orders placed or performed during the hospital encounter of 02/13/2018  Blood Culture ID Panel (Reflexed) (Collected: 02/20/2018 12:51 PM)  Result Value Ref Range   Enterococcus species NOT DETECTED NOT DETECTED   Listeria monocytogenes NOT DETECTED NOT DETECTED   Staphylococcus species DETECTED (A) NOT DETECTED   Staphylococcus aureus (BCID) DETECTED (A) NOT DETECTED   Methicillin resistance DETECTED (A) NOT DETECTED   Streptococcus species NOT DETECTED NOT DETECTED   Streptococcus agalactiae NOT DETECTED NOT DETECTED   Streptococcus pneumoniae NOT DETECTED NOT DETECTED   Streptococcus pyogenes NOT DETECTED NOT DETECTED   Acinetobacter baumannii NOT DETECTED NOT DETECTED   Enterobacteriaceae species NOT DETECTED NOT DETECTED   Enterobacter cloacae complex NOT DETECTED NOT DETECTED   Escherichia coli NOT DETECTED NOT DETECTED   Klebsiella oxytoca NOT DETECTED NOT DETECTED   Klebsiella pneumoniae NOT DETECTED NOT DETECTED   Proteus species NOT DETECTED NOT DETECTED   Serratia marcescens NOT DETECTED NOT DETECTED   Haemophilus influenzae NOT DETECTED NOT DETECTED   Neisseria meningitidis NOT DETECTED NOT DETECTED   Pseudomonas aeruginosa NOT DETECTED NOT DETECTED   Candida albicans NOT DETECTED NOT DETECTED   Candida glabrata NOT DETECTED NOT DETECTED   Candida krusei NOT DETECTED NOT DETECTED   Candida parapsilosis NOT DETECTED NOT DETECTED   Candida tropicalis NOT DETECTED NOT DETECTED    Jaiden Dinkins, Rande Lawman 02/16/2018  3:24 PM

## 2018-02-16 NOTE — Progress Notes (Signed)
eLink Physician-Brief Progress Note Patient Name: Chris Lopez DOB: December 31, 1945 MRN: [id number]   Date of Service  02/16/2018  HPI/Events of Note  Portable CXR looks like pulmonary vascular congestion.   eICU Interventions  Will order: 1. D/C IV fluid.  2. Lasix 40 mg IV now.     Intervention Category Major Interventions: Hypoxemia - evaluation and management  Sommer,Steven Eugene 02/16/2018, 3:34 AM

## 2018-02-16 NOTE — Progress Notes (Signed)
CRITICAL VALUE ALERT  Critical Value:  Troponin 0.25  Date & Time Notied:  02/16/18 1244  Provider Notified: Dr. Vaughan Browner  Orders Received/Actions taken: No new orders

## 2018-02-16 NOTE — Progress Notes (Signed)
[place] Progress Note Patient Name: Chris Lopez DOB: 10-26-1945 MRN: [id number]   Date of Service  02/16/2018  HPI/Events of Note  Hypoxia - Sat = 87% on 4 L/min [place] O2. RR = 25 and HR = 129.   eICU Interventions  Will order: 1. 100% NRBM. 2. Portable CXR now.         Sommer,Steven Eugene 02/16/2018, 3:00 AM

## 2018-02-16 NOTE — Progress Notes (Addendum)
NAME:  Chris Lopez, MRN:  [id number], DOB:  12-28-1945, LOS: 0 ADMISSION DATE:  02/28/2018, CONSULTATION DATE: 02/25/2018 REFERRING MD: [facility], CHIEF COMPLAINT: Body aches  Brief History   Chris Lopez 72 year old male with history of mantle cell lymphoma undergoing chemotherapy coming in with generalized body aches and hypotension, sepsis with GPC bacteremia  Past Medical History  Mantle cell lymphoma, Anxiety, Arthritis, BPH (benign prostatic hyperplasia), Chronic bronchitis, COPD (chronic obstructive pulmonary disease) ([place]), Depression, Diabetes mellitus without complication ([place]), GERD (gastroesophageal reflux disease), Hypertension,  Significant Hospital Events   11/9 > admit from [facility] ED  Consults: date of consult/date signed off & final recs:    Procedures (surgical and bedside):  Rt chemo port >>  Significant Diagnostic Tests:    Micro Data:  Blood culture APH 11/9 >> 1/2 GPC  Antimicrobials:  11/9 Zosyn>  11/9 Vancomycin>  Subjective:  Taken off BiPAP. Requesting something to drink Denies any dyspnea, chest pain Did not need pressors overnight  Objective   Blood pressure (!) 86/65, pulse (!) 131, temperature 100.3 F (37.9 C), temperature source Axillary, resp. rate (!) 21, height 6' (1.829 m), weight 101.8 kg, SpO2 96 %.    FiO2 (%):  [40 %-100 %] 40 %   Intake/Output Summary (Last 24 hours) at 02/16/2018 1058 Last data filed at 02/16/2018 0600 Gross per 24 hour  Intake 3708.98 ml  Output 1300 ml  Net 2408.98 ml   Filed Weights   02/22/2018 1245 02/16/18 0500  Weight: 95.3 kg 101.8 kg    Examination: Gen:      Chronically ill-appearing HEENT:  EOMI, sclera anicteric Neck:     No masses; no thyromegaly Lungs:    Bilateral rhonchi CV:         Regular rate and rhythm; no murmurs Abd:      + bowel sounds; soft, non-tender; no palpable masses, no distension Ext:    No edema; adequate peripheral perfusion Skin:      Warm and dry; no  rash Neuro: alert and oriented x 3  Resolved Hospital Problem list     Assessment & Plan:  72 year old with mantle cell lymphoma on chemotherapy admitted with neutropenia, septic shock, gram-positive bacteremia.  Severe sepsis. GPC bacteremia 1/2.  Continue Vanco, Zosyn Weaned off Levophed. Repeat labs including lactic acid, cortisol Port site looks clean.  We will repeat cultures and if positive and Port may need to come out and get an echo  Acute respiratory failure, possible pneumonia BiPAP as needed. Check urine Legionella, pneumococcus, respiratory virus panel.  Acute kidney injury Follow urine output and creatinine Check renal ultrasound.  Pancytopenia, neutropenia secondary to chemotherapy for mantle cell lymphoma Follow CBC.  Goals of care Discussed with patient and wife at bedside.  He was able to converse with [place] once he was off BiPAP.  He wants to CODE STATUS to be DNR and wife agrees with this decision  Disposition / Summary of Today's Plan 02/16/18   Keep in ICU, continue antibiotics Supportive care, repeat labs including lactic acid BiPAP as needed    Diet: NPO Pain/Anxiety/Delirium protocol (if indicated): NA VAP protocol (if indicated): NA DVT prophylaxis: SCDs.  No Lovenox due to thrombocytopenia GI prophylaxis: NA Hyperglycemia protocol: NA Mobility: bedrest Code Status: DNR Family Communication: Husband and wife updated at bedside  The patient is critically ill with multiple organ system failure and requires high complexity decision making for assessment and support, frequent evaluation and titration of therapies, advanced monitoring, review  of radiographic studies and interpretation of complex data.   Critical Care Time devoted to patient care services, exclusive of separately billable procedures, described in this note is 35 minutes.   Marshell Garfinkel MD [facility] Pager [phone] If no answer or after 3pm call:  [phone] 02/16/2018, 10:59 AM

## 2018-02-17 ENCOUNTER — Inpatient Hospital Stay (HOSPITAL_COMMUNITY): Payer: Medicare HMO

## 2018-02-17 ENCOUNTER — Other Ambulatory Visit (HOSPITAL_COMMUNITY): Payer: Medicare HMO

## 2018-02-17 DIAGNOSIS — I4891 Unspecified atrial fibrillation: Secondary | ICD-10-CM

## 2018-02-17 DIAGNOSIS — R7881 Bacteremia: Secondary | ICD-10-CM

## 2018-02-17 DIAGNOSIS — N179 Acute kidney failure, unspecified: Secondary | ICD-10-CM

## 2018-02-17 DIAGNOSIS — R7989 Other specified abnormal findings of blood chemistry: Secondary | ICD-10-CM

## 2018-02-17 DIAGNOSIS — N178 Other acute kidney failure: Secondary | ICD-10-CM

## 2018-02-17 DIAGNOSIS — J9601 Acute respiratory failure with hypoxia: Secondary | ICD-10-CM

## 2018-02-17 DIAGNOSIS — R778 Other specified abnormalities of plasma proteins: Secondary | ICD-10-CM

## 2018-02-17 LAB — POCT I-STAT 3, ART BLOOD GAS (G3+)
Acid-base deficit: 5 mmol/L — ABNORMAL HIGH (ref 0.0–2.0)
Bicarbonate: 18.2 mmol/L — ABNORMAL LOW (ref 20.0–28.0)
O2 SAT: 96 %
PCO2 ART: 27.5 mmHg — AB (ref 32.0–48.0)
Patient temperature: 36.7
TCO2: 19 mmol/L — ABNORMAL LOW (ref 22–32)
pH, Arterial: 7.427 (ref 7.350–7.450)
pO2, Arterial: 78 mmHg — ABNORMAL LOW (ref 83.0–108.0)

## 2018-02-17 LAB — URINE CULTURE: CULTURE: NO GROWTH

## 2018-02-17 LAB — BASIC METABOLIC PANEL
ANION GAP: 14 (ref 5–15)
Anion gap: 12 (ref 5–15)
Anion gap: 14 (ref 5–15)
BUN: 61 mg/dL — AB (ref 8–23)
BUN: 64 mg/dL — ABNORMAL HIGH (ref 8–23)
BUN: 70 mg/dL — ABNORMAL HIGH (ref 8–23)
CALCIUM: 8 mg/dL — AB (ref 8.9–10.3)
CHLORIDE: 98 mmol/L (ref 98–111)
CO2: 22 mmol/L (ref 22–32)
CO2: 20 mmol/L — AB (ref 22–32)
CO2: 21 mmol/L — AB (ref 22–32)
CREATININE: 3.2 mg/dL — AB (ref 0.61–1.24)
Calcium: 7.4 mg/dL — ABNORMAL LOW (ref 8.9–10.3)
Calcium: 7.5 mg/dL — ABNORMAL LOW (ref 8.9–10.3)
Chloride: 98 mmol/L (ref 98–111)
Chloride: 98 mmol/L (ref 98–111)
Creatinine, Ser: 3.15 mg/dL — ABNORMAL HIGH (ref 0.61–1.24)
Creatinine, Ser: 3.07 mg/dL — ABNORMAL HIGH (ref 0.61–1.24)
GFR calc Af Amer: 22 mL/min — ABNORMAL LOW (ref >60)
GFR calc non Af Amer: 18 mL/min — ABNORMAL LOW (ref >60)
GFR calc non Af Amer: 18 mL/min — ABNORMAL LOW (ref >60)
GFR, EST AFRICAN AMERICAN: 21 mL/min — AB (ref >60)
GFR, EST AFRICAN AMERICAN: 21 mL/min — AB (ref >60)
GFR, EST NON AFRICAN AMERICAN: 19 mL/min — AB (ref >60)
GLUCOSE: 175 mg/dL — AB (ref 70–99)
Glucose, Bld: 107 mg/dL — ABNORMAL HIGH (ref 70–99)
Glucose, Bld: 163 mg/dL — ABNORMAL HIGH (ref 70–99)
Potassium: 3.2 mmol/L — ABNORMAL LOW (ref 3.5–5.1)
Potassium: 3.1 mmol/L — ABNORMAL LOW (ref 3.5–5.1)
Potassium: 3 mmol/L — ABNORMAL LOW (ref 3.5–5.1)
SODIUM: 132 mmol/L — AB (ref 135–145)
SODIUM: 133 mmol/L — AB (ref 135–145)
Sodium: 132 mmol/L — ABNORMAL LOW (ref 135–145)

## 2018-02-17 LAB — CBC WITH DIFFERENTIAL/PLATELET
BASOS ABS: 0 10*3/uL (ref 0.0–0.1)
Basophils Relative: 0 %
EOS PCT: 0 %
Eosinophils Absolute: 0 10*3/uL (ref 0.0–0.5)
HEMATOCRIT: 25.3 % — AB (ref 39.0–52.0)
Hemoglobin: 8.7 g/dL — ABNORMAL LOW (ref 13.0–17.0)
LYMPHS ABS: 0.1 10*3/uL — AB (ref 0.7–4.0)
LYMPHS PCT: 7 %
MCH: 30.2 pg (ref 26.0–34.0)
MCHC: 34.4 g/dL (ref 30.0–36.0)
MCV: 87.8 fL (ref 80.0–100.0)
Monocytes Absolute: 0.1 10*3/uL (ref 0.1–1.0)
Monocytes Relative: 8 %
NEUTROS PCT: 85 %
NRBC: 0 % (ref 0.0–0.2)
Neutro Abs: 1.4 10*3/uL — ABNORMAL LOW (ref 1.7–7.7)
PLATELETS: 44 10*3/uL — AB (ref 150–400)
RBC: 2.88 MIL/uL — ABNORMAL LOW (ref 4.22–5.81)
RDW: 16.8 % — ABNORMAL HIGH (ref 11.5–15.5)
WBC: 1.7 10*3/uL — ABNORMAL LOW (ref 4.0–10.5)

## 2018-02-17 LAB — GLUCOSE, CAPILLARY
Glucose-Capillary: 134 mg/dL — ABNORMAL HIGH (ref 70–99)
Glucose-Capillary: 165 mg/dL — ABNORMAL HIGH (ref 70–99)
Glucose-Capillary: 71 mg/dL (ref 70–99)

## 2018-02-17 LAB — MRSA PCR SCREENING: MRSA by PCR: NEGATIVE

## 2018-02-17 LAB — PHOSPHORUS: PHOSPHORUS: 4.6 mg/dL (ref 2.5–4.6)

## 2018-02-17 LAB — LEGIONELLA PNEUMOPHILA SEROGP 1 UR AG: L. pneumophila Serogp 1 Ur Ag: NEGATIVE

## 2018-02-17 LAB — TROPONIN I: TROPONIN I: 0.24 ng/mL — AB (ref <0.03)

## 2018-02-17 LAB — MAGNESIUM: MAGNESIUM: 1.4 mg/dL — AB (ref 1.7–2.4)

## 2018-02-17 LAB — LACTIC ACID, PLASMA: Lactic Acid, Venous: 0.9 mmol/L (ref 0.5–1.9)

## 2018-02-17 LAB — PATHOLOGIST SMEAR REVIEW

## 2018-02-17 LAB — CK: Total CK: 82 U/L (ref 49–397)

## 2018-02-17 MED ORDER — SODIUM CHLORIDE 0.9 % IV SOLN: 750.0000 mg | INTRAVENOUS | Status: DC

## 2018-02-17 MED ORDER — DILTIAZEM HCL-DEXTROSE 100-5 MG/100ML-% IV SOLN (PREMIX)
5.0000 mg/h | INTRAVENOUS | Status: DC
  Filled 2018-02-17: qty 100

## 2018-02-17 MED ORDER — DILTIAZEM LOAD VIA INFUSION
20.0000 mg | Freq: Once | INTRAVENOUS | Status: AC
  Administered 2018-02-17: 20 mg via INTRAVENOUS
  Filled 2018-02-17: qty 20

## 2018-02-17 MED ORDER — POTASSIUM CHLORIDE 10 MEQ/50ML IV SOLN
10.0000 meq | INTRAVENOUS | Status: AC
  Administered 2018-02-17 – 2018-02-18 (×2): 10 meq via INTRAVENOUS
  Filled 2018-02-17: qty 50

## 2018-02-17 MED ORDER — INSULIN ASPART 100 UNIT/ML ~~LOC~~ SOLN
0.0000 [IU] | SUBCUTANEOUS | Status: DC
  Administered 2018-02-17: 1 [IU] via SUBCUTANEOUS
  Administered 2018-02-17: 2 [IU] via SUBCUTANEOUS
  Administered 2018-02-18 – 2018-02-19 (×8): 1 [IU] via SUBCUTANEOUS
  Administered 2018-02-20: 2 [IU] via SUBCUTANEOUS
  Administered 2018-02-20: 1 [IU] via SUBCUTANEOUS
  Administered 2018-02-20: 2 [IU] via SUBCUTANEOUS
  Administered 2018-02-20 – 2018-02-21 (×3): 1 [IU] via SUBCUTANEOUS
  Administered 2018-02-21: 2 [IU] via SUBCUTANEOUS
  Administered 2018-02-21: 1 [IU] via SUBCUTANEOUS

## 2018-02-17 MED ORDER — FUROSEMIDE 10 MG/ML IJ SOLN
40.0000 mg | Freq: Four times a day (QID) | INTRAMUSCULAR | Status: AC
  Administered 2018-02-17 (×2): 40 mg via INTRAVENOUS
  Filled 2018-02-17 (×2): qty 4

## 2018-02-17 MED ORDER — AMIODARONE LOAD VIA INFUSION
150.0000 mg | Freq: Once | INTRAVENOUS | Status: AC
  Administered 2018-02-17: 150 mg via INTRAVENOUS
  Filled 2018-02-17: qty 83.34

## 2018-02-17 MED ORDER — AMIODARONE HCL IN DEXTROSE 360-4.14 MG/200ML-% IV SOLN
60.0000 mg/h | INTRAVENOUS | Status: DC
  Administered 2018-02-17 (×2): 60 mg/h via INTRAVENOUS
  Filled 2018-02-17 (×2): qty 200

## 2018-02-17 MED ORDER — AMIODARONE HCL IN DEXTROSE 360-4.14 MG/200ML-% IV SOLN
60.0000 mg/h | INTRAVENOUS | Status: AC
  Administered 2018-02-17 (×2): 60 mg/h via INTRAVENOUS
  Filled 2018-02-17 (×2): qty 200

## 2018-02-17 MED ORDER — MAGNESIUM SULFATE 2 GM/50ML IV SOLN
2.0000 g | Freq: Once | INTRAVENOUS | Status: AC
  Administered 2018-02-17: 2 g via INTRAVENOUS
  Filled 2018-02-17: qty 50

## 2018-02-17 MED ORDER — AMIODARONE HCL IN DEXTROSE 360-4.14 MG/200ML-% IV SOLN
30.0000 mg/h | INTRAVENOUS | Status: DC
  Administered 2018-02-18 – 2018-02-19 (×4): 30 mg/h via INTRAVENOUS
  Administered 2018-02-19 – 2018-02-22 (×12): 60 mg/h via INTRAVENOUS
  Administered 2018-02-22 – 2018-02-25 (×7): 30 mg/h via INTRAVENOUS
  Filled 2018-02-17 (×24): qty 200

## 2018-02-17 MED ORDER — POTASSIUM CHLORIDE 10 MEQ/50ML IV SOLN
10.0000 meq | INTRAVENOUS | Status: AC
  Administered 2018-02-17 (×2): 10 meq via INTRAVENOUS
  Filled 2018-02-17 (×2): qty 50

## 2018-02-17 MED ORDER — AMIODARONE HCL IN DEXTROSE 360-4.14 MG/200ML-% IV SOLN: 30.0000 mg/h | INTRAVENOUS | Status: DC

## 2018-02-17 MED ORDER — SODIUM CHLORIDE 0.9 % IV SOLN
800.0000 mg | INTRAVENOUS | Status: DC
  Administered 2018-02-17 – 2018-02-25 (×5): 800 mg via INTRAVENOUS
  Filled 2018-02-17 (×6): qty 16

## 2018-02-17 MED ORDER — GABAPENTIN 300 MG PO CAPS
300.0000 mg | ORAL_CAPSULE | Freq: Every day | ORAL | Status: DC
  Administered 2018-02-17 – 2018-02-19 (×3): 300 mg via ORAL
  Filled 2018-02-17 (×4): qty 1

## 2018-02-17 NOTE — Progress Notes (Signed)
[facility] PCCM PROGRESS NOTE  S: 72 year old male currently undergoing chemotherapy for mantle cell lymphoma now admitted for septic shock secondary to [facility] bacteremia felt to be from his port. He is being treated with daptomycin. I am being called tonight to evaluate RLE edema. The patient reports he has had some swelling there, but this is significantly worse. Denies pain there at rest.   O: BP 109/72   Pulse (!) 131   Temp 99.1 F (37.3 C) (Oral)   Resp (!) 25   Ht 6' (1.829 m)   Wt 100.7 kg   SpO2 94%   BMI 30.11 kg/m   General:  Elderly appearing male, acutely ill appearing.  Neuro:  Alert, oriented, non-focal HEENT:  [place]/AT, PERRL, no JVD Cardiovascular:  Tachy, IRIR,  no MRG Abdomen:  Soft, non-tender, non-distended Musculoskeletal:  No acute deformity Skin: R lateral dorsal foot erythema and edema. Extends into anterior ankle. Cap refill brisk with strong pedal pulse.    A/P: Differential includes DVT (active Ca and hospitalized risk factors, SCDs for ppx), arterial thrombus (GPC bacteremia, echo unable to be done due to tachypnea), and infection (he has been febrile today, WBC remains low, in the setting of septic shock. Cellulitis, septic joint).  Plan: - Doppler RLE, if positive will start anticoagulation - If doppler negative, broaden ABX to cover cellulitis.  - Will defer arterial dopplers for now, strong pulses and brisk cap refill. Although he has bacteremia no objective evidence of endocarditis.  - Xray R foot and ankle.   Georgann Housekeeper, ACNP [facility] Pulmonology/Critical Care Pager [phone] or [phone]

## 2018-02-17 NOTE — Progress Notes (Signed)
[place] Progress Note Patient Name: GASPER HOPES DOB: 08/30/1945 MRN: [id number]   Date of Service  02/17/2018  HPI/Events of Note  K+ = 3.0 and Creatinine = 3.2  eICU Interventions  Will order: 1. Replace K+ cautiously.  2. Repeat BMP already ordered for 5 AM.      Intervention Category Major Interventions: Electrolyte abnormality - evaluation and management  Persis Graffius Eugene 02/17/2018, 11:39 PM

## 2018-02-17 NOTE — Progress Notes (Signed)
  Echocardiogram 2D Echocardiogram has been attempted. HR 130 too high to perform echo.  Chris Lopez 02/17/2018, 1:50 PM

## 2018-02-17 NOTE — Progress Notes (Signed)
Pharmacy Antibiotic Note  Chris Lopez is a 72 y.o. male admitted on 03/03/2018 with MRSA bacteremia.  Pharmacy has been consulted for daptomycin dosing. Patient has a history of mantle cell lymphoma currently undergoing chemotherapy. Patient has a port in place. WBC currently 1.7 and Tmax-101.1. Scr today is down to 3.15 (Baseline around 1).   Plan: Daptomycin 800 mg (~9 mg/kg AdjBW) every 48 hours  Weekly CK on Mondays Follow up repeat blood cultures, ECHO and removal of port    Height: 6' (182.9 cm) Weight: 222 lb 0.1 oz (100.7 kg) IBW/kg (Calculated) : 77.6  Temp (24hrs), Avg:98.8 F (37.1 C), Min:97.6 F (36.4 C), Max:101.1 F (38.4 C)  Recent Labs  Lab 02/14/2018 1253 03/05/2018 1315 02/12/2018 1512 02/16/18 0504 02/16/18 1124 02/17/18 0142  WBC 2.1*  --   --  1.2*  --  1.7*  CREATININE 3.56*  --   --  3.37*  --  3.15*  LATICACIDVEN  --  2.3* 2.6*  --  1.3 0.9    Estimated Creatinine Clearance: 26.4 mL/min (A) (by C-G formula based on SCr of 3.15 mg/dL (H)).    Allergies  Allergen Reactions  . Bee Venom Anaphylaxis    Allergic to bee stings not the kit  . Feldene [Piroxicam] Rash and Dermatitis    Antimicrobials this admission: 11/9 Vanc >>11/11 11/9 Zosyn >>11/11 11/11 Dapto>>   Microbiology results: 11/9 BCx: 1/2 MRSA 11/10 Bcx: NGTD  Thank you for allowing pharmacy to be a part of this patient's care.  Susa Raring, PharmD, BCPS Infectious Diseases Clinical Pharmacist Phone: [phone] 02/17/2018 9:34 AM

## 2018-02-17 NOTE — Progress Notes (Signed)
Patient with history of DM with no CBG or insulin ordered. BMP last checked at 10:56 with a result of potassium 3.1 and sodium 132 that were not replaced. Patient received lasix at 11:16 and 1615 with no recheck. New onset of right lower extremity swelling, tenderness and warmth. MD notified of all issues. Q 4 CBG and sliding scale added, BMP ordered, and ground team notified of new swelling. Will continue to monitor.

## 2018-02-17 NOTE — Progress Notes (Addendum)
   NAME:  Chris Lopez, MRN:  [id number], DOB:  [birth year], LOS: 0 ADMISSION DATE:  03/04/2018, CONSULTATION DATE: 03/02/2018 REFERRING MD: [facility] emergency room, CHIEF COMPLAINT: Body aches  Brief History   Chris Lopez 72 year old male with history of mantle cell lymphoma undergoing chemotherapy coming in with generalized body aches and hypotension, sepsis with GPC bacteremia  Past Medical History  Mantle cell lymphoma, Anxiety, Arthritis, BPH (benign prostatic hyperplasia), Chronic bronchitis, COPD (chronic obstructive pulmonary disease) ([place]), Depression, Diabetes mellitus without complication ([place]), GERD (gastroesophageal reflux disease), Hypertension,  Significant Hospital Events   11/9 > admit from [facility] ED  Consults: date of consult/date signed off & final recs:    Procedures (surgical and bedside):  Rt chemo port >>  Significant Diagnostic Tests:    Micro Data:  Blood culture APH 11/9 >> 1/2 GPC  Antimicrobials:  11/9 Zosyn>  11/9 Vancomycin>  Subjective:  Notes some improvement in dyspnea Wants to eat  Remains off/on BIPAP  Objective   Blood pressure 92/61, pulse (!) 130, temperature 99.5 F (37.5 C), temperature source Axillary, resp. rate 18, height 6' (1.829 m), weight 100.7 kg, SpO2 99 %.    Vent Mode: BIPAP FiO2 (%):  [55 %-60 %] 60 % PEEP:  [5 cmH20] 5 cmH20 Pressure Support:  [5 cmH20] 5 cmH20   Intake/Output Summary (Last 24 hours) at 02/17/2018 0954 Last data filed at 02/17/2018 0900 Gross per 24 hour  Intake 1665.34 ml  Output 3475 ml  Net -1809.66 ml   Filed Weights   02/13/2018 1245 02/16/18 0500 02/17/18 0500  Weight: 95.3 kg 101.8 kg 100.7 kg    Examination:  General:  Mild tachypnea but speaking in full sentences HENT: NCAT OP clear PULM: crackles bases, normal effort CV: Irreg irreg, no mgr GI: BS+, soft, nontender MSK: normal bulk and tone Neuro: awake, alert, no distress, MAEW    Resolved Hospital Problem list      Assessment & Plan:  72 year old with mantle cell lymphoma on chemotherapy admitted with neutropenia, septic shock, gram-positive bacteremia.  Acute respiratory failure with hypoxemia due to acute pulmonary edema >lasix today >bipap prn >titrate O2 to maintain O2 saturation > 90%  MRSA bacteremia, neutropenia, port in place >Change vanc/zosyn to datpo per ID >Repeat blood culture >Echo >Remove chemo port  Atrial fibrillation with RVR > stop amiodarone > start diltiazem > consult cardiology > hold anticoagulation as little utility in septic patients and thrombocytopenia  Acute pulmonary edema > echo  AKI Monitor BMET and UOP Replace electrolytes as needed Make euvolemic: lasix  Pancytopenia, neutropenia secondary to chemotherapy for mantle cell lymphoma Monitor CBC  COPD Duoneb qid  Home gabapentin dependence Restart low dose/renal dose  CODE STATUS: DNR  Disposition / Summary of Today's Plan 02/17/18   Read above    Diet: advance diet Pain/Anxiety/Delirium protocol (if indicated): NA VAP protocol (if indicated): NA DVT prophylaxis: SCDs.  GI prophylaxis: NA Hyperglycemia protocol: NA Mobility: bedrest Code Status: DNR Family Communication: wife updated bedside by me 11/11  The patient is critically ill with multiple organ system failure and requires high complexity decision making for assessment and support, frequent evaluation and titration of therapies, advanced monitoring, review of radiographic studies and interpretation of complex data.   Critical Care Time devoted to patient care services, exclusive of separately billable procedures, described in this note is 33 minutes.   Roselie Awkward, MD [place] [facility] Pager: [phone] Cell: [phone] After 3pm or if no response, call [phone]  02/17/2018, 9:54 AM

## 2018-02-17 NOTE — Progress Notes (Signed)
Pt arrived to 2M09 at 1527 as a lateral transfer from [place]. Report called previously by Silva Bandy, RN. Pt arrived with Amiodarone infusing at 33.3 mL/hr and on non-rebreather. Pt A&O x4. No new orders. Will continue to closely monitor pt.

## 2018-02-17 NOTE — Progress Notes (Signed)
eLink Physician-Brief Progress Note Patient Name: Chris Lopez DOB: 1945/10/28 MRN: [id number]   Date of Service  02/17/2018  HPI/Events of Note  AFIB with RVR - Ventricular Rate = 131. Appears to have been in AFIB with RVR most of day. BP = 94/63.   eICU Interventions  Will order: 1. Amiodarone IV load and infusion.  2. Send AM labs now.      Intervention Category Major Interventions: Arrhythmia - evaluation and management  Sommer,Steven Eugene 02/17/2018, 1:16 AM

## 2018-02-17 NOTE — Progress Notes (Signed)
[place] Progress Note Patient Name: Chris Lopez DOB: February 14, 1946 MRN: [id number]   Date of Service  02/17/2018  HPI/Events of Note  Multiple issues: 1. Last blood glucose = 175 and Hx Type 2 DM, 2. K+ = 3.1 this AM and has received Lasix since and 3. RLE red top of foot and swollen/tender to knee.   eICU Interventions  Will order: 1. Q 4 hour sensitive Novolog SSI. 2. BMP STAT. 3. Ground team to evaluate RLE (r/o DVT vs Cellulitis)     Intervention Category Major Interventions: Hyperglycemia - active titration of insulin therapy;Electrolyte abnormality - evaluation and management  Lysle Dingwall 02/17/2018, 8:32 PM

## 2018-02-17 NOTE — Progress Notes (Signed)
[place] Progress Note Patient Name: Chris Lopez DOB: 1946/01/09 MRN: [id number]   Date of Service  02/17/2018  HPI/Events of Note  K+ = 3.2 and Creatinine = 3.15. Patient in AFIB with RVR.   eICU Interventions  Will replace K+ cautiously and repeat BMP at 9 AM.     Intervention Category Major Interventions: Electrolyte abnormality - evaluation and management  Sommer,Steven Eugene 02/17/2018, 3:13 AM

## 2018-02-17 NOTE — Consult Note (Signed)
[name]    Date of Admission:  03/03/2018   Total days of antibiotics 2        Day 1: Vanc, Zosyn        Day 2: Vanc, Zosyn              Reason for Consult: Bacteremia   Referring Physician: Johnnye Lana consult Primary Care Physician: Celene Squibb  Active Problems:   Sepsis ([place])   Severe sepsis ([place])   Septic shock ([place])   AKI (acute kidney injury) ([place])   Hyponatremia   Acute respiratory failure ([place])   . ipratropium-albuterol  3 mL Nebulization Q6H  . mouth rinse  15 mL Mouth Rinse BID    Recommendations: 1. MRSA bacteremia: - Start dapto - D/c zosyn, vanc - F/u Blood culture - Repeat blood culture now (abx therapy started 11/9) - Contact precautions - Will need chemo port to be removed - TTE w/ bubble  Assessment: Chris Lopez is a 72 yo M w/ PMH of Stage IVb mantle cell lymphoma on Bendamustine and Rituximab therapy with leukopenia presenting with MRSA bacteremia. His most likely source of infection is his chemo port which will have to be removed. He also has significant history of knee replacements which is concerning as MRSA will tend to stick to prosthetic equipment. His antibiotic regimen will be changed to dapto due to concerns of renal failure after 2 days of vanc and zosyn. Current creatinine is 3.2 (baseline 1.3)   HPI: Chris Lopez is a 72 y.o. male w/ PMH of Stage IVb Mantle Cell Lymphoma treated at [facility] w/ Bendamustine & Rituximab, HTN, GERD, COPD, BPH presenting with fever and altered mental status. He was examined and evaluated at bedside with his wife present. Chris Lopez states he does not remember what happened and most of history was given by his wife. She states he was in his usual state of health until 2 days prior to hospitalization (02/13/18) when he developed a fever of unknown degree. He continued to endorse generalized malaise and lethargy until 02/18/2018 when he became unconscious and he was brought by  his wife.   His wife states that he had prior MRSA bacteremia more than 10 years ago when he had a complication of left knee arthroscopy but never had any incidence of bacteremia or sepsis. However, she was informed he was colonized with the organism. Around the time of his last infusion on 01/29/18 his wife also developed a hand infection which was drained at urgent care and she was told that the abscess had MRSA growing in it. He denies any complication with his chemo port, which was put in July and denies any cough, diarrhea, open wounds, headaches or neck stiffness. He had occasional night sweats and fevers as a result of his Mantle Cell Lymphoma since July.   In the ED he was found to have systolic bp in the 16O with tachycardia and tachypnea. He also was found to have leukopenia at 2.1 and lactate at 2.3. Blood cultures were drawn and he was started on Vanc and Zosyn. Lactate trended down with fluids. Admitted to the ICU w/ Levophed but weaned off pressors. Currently on BiPAP.  Review of Systems: Review of Systems  Constitutional: Positive for fever and malaise/fatigue.  HENT: Negative for congestion, ear pain and sore throat.   Eyes: Negative for discharge.  Respiratory: Negative for cough, sputum production and shortness of breath.  Cardiovascular: Negative for chest pain and palpitations.  Gastrointestinal: Positive for diarrhea. Negative for constipation, nausea and vomiting.  Genitourinary: Negative for dysuria, frequency and urgency.  Musculoskeletal: Negative for neck pain.  Skin: Positive for rash (bilateral lower extremity non-palpable purura).  Neurological: Negative for sensory change, focal weakness and headaches.    Past Medical History:  Diagnosis Date  . Anxiety   . Arthritis   . BPH (benign prostatic hyperplasia)   . Chronic bronchitis   . COPD (chronic obstructive pulmonary disease) ([place])   . Depression   . Diabetes mellitus without complication ([place])    Resolved;  not needing medications at this time.  Marland Kitchen GERD (gastroesophageal reflux disease)   . Hypertension   . Lymphoma, mantle cell ([facility])     Social History   Tobacco Use  . Smoking status: Current Every Day Smoker    Packs/day: 1.25    Years: 50.00    Pack years: 62.50    Types: Cigarettes  . Smokeless tobacco: Never Used  Substance Use Topics  . Alcohol use: No  . Drug use: No    Family History  Problem Relation Age of Onset  . Colon cancer Maternal Grandmother   . Colon cancer Paternal Grandmother   . Heart disease Mother   . Arthritis Mother   . Prostate cancer Father   . Heart attack Brother   . Heart disease Maternal Aunt   . Cancer Maternal Aunt   . Heart disease Maternal Uncle   . Cancer Maternal Uncle   . Heart disease Paternal Aunt   . Heart disease Paternal Uncle   . Heart disease Brother   . Heart attack Brother   . Arthritis Brother    Allergies  Allergen Reactions  . Bee Venom Anaphylaxis    Allergic to bee stings not the kit  . Feldene [Piroxicam] Rash and Dermatitis    OBJECTIVE: Blood pressure (!) 97/58, pulse (!) 137, temperature 99.5 F (37.5 C), temperature source Axillary, resp. rate 17, height 6' (1.829 m), weight 100.7 kg, SpO2 98 %.  Physical Exam  Constitutional: He is oriented to person, place, and time. He appears well-developed and well-nourished. No distress.  Appears somnolent. Currently on BiPAP  HENT:  Head: Normocephalic and atraumatic.  Mouth/Throat: No oropharyngeal exudate.  Dry mucous membranes  Eyes: Pupils are equal, round, and reactive to light. Conjunctivae and EOM are normal.  Neck: Normal range of motion. Neck supple.  Cardiovascular: Normal rate, regular rhythm, normal heart sounds and intact distal pulses.  No murmur heard. Pulmonary/Chest: Effort normal. No respiratory distress. He has no wheezes. He has rales (bilateral lower exttremities).  Abdominal: Soft. Bowel sounds are normal. He exhibits distension. There is no  tenderness. There is no guarding.  Musculoskeletal: Normal range of motion. He exhibits no edema or tenderness.  Surgical scars on bilateral knees  Lymphadenopathy:    He has no cervical adenopathy.  Neurological: He is alert and oriented to person, place, and time. No cranial nerve deficit.  Skin: Skin is warm and dry. Rash (bilateral lower and upper extremities supercifical healing sores) noted.  Central port w/o surrounding erythema, warmth, redness  Psychiatric: He has a normal mood and affect. His behavior is normal. Thought content normal.    Lab Results Lab Results  Component Value Date   WBC 1.7 (L) 02/17/2018   HGB 8.7 (L) 02/17/2018   HCT 25.3 (L) 02/17/2018   MCV 87.8 02/17/2018   PLT 44 (L) 02/17/2018    Lab Results  Component Value Date   CREATININE 3.15 (H) 02/17/2018   BUN 61 (H) 02/17/2018   NA 132 (L) 02/17/2018   K 3.2 (L) 02/17/2018   CL 98 02/17/2018   CO2 22 02/17/2018    Lab Results  Component Value Date   ALT 24 02/18/2018   AST 40 03/05/2018   ALKPHOS 71 02/17/2018   BILITOT 1.2 02/13/2018     Microbiology: Recent Results (from the past 240 hour(s))  Culture, blood (Routine x 2)     Status: None (Preliminary result)   Collection Time: 03/03/2018 12:51 PM  Result Value Ref Range Status   Specimen Description   Final    LEFT ANTECUBITAL BOTTLES DRAWN AEROBIC AND ANAEROBIC Performed at [facility], [address], [place], [place] [address]    Special Requests   Final    Blood Culture adequate volume Performed at [facility], [address] [place], [place] [address]    Culture  Setup Time   Final    GRAM POSITIVE COCCI ANAEROBIC BOTTLE Gram Stain Report Called to,Read Back By and Verified With: BROWN,KYLIE ([place]) _0  BY MATTHEWS, B 11.10.19 [facility] CRITICAL RESULT CALLED TO, READ BACK BY AND VERIFIED WITH: PHARMD RACHEL R 1454 [id number] FCP Performed at [facility], [place] [address], [place], [place] [address]    Culture  GRAM POSITIVE COCCI  Final   Report Status PENDING  Incomplete  Blood Culture ID Panel (Reflexed)     Status: Abnormal   Collection Time: 02/24/2018 12:51 PM  Result Value Ref Range Status   Enterococcus species NOT DETECTED NOT DETECTED Final   Listeria monocytogenes NOT DETECTED NOT DETECTED Final   Staphylococcus species DETECTED (A) NOT DETECTED Final    Comment: CRITICAL RESULT CALLED TO, READ BACK BY AND VERIFIED WITH: PHARMD RACHEL R 1454 [id number] FCP    Staphylococcus aureus (BCID) DETECTED (A) NOT DETECTED Final    Comment: Methicillin (oxacillin)-resistant Staphylococcus aureus (MRSA). MRSA is predictably resistant to beta-lactam antibiotics (except ceftaroline). Preferred therapy is vancomycin unless clinically contraindicated. Patient requires contact precautions if  hospitalized. CRITICAL RESULT CALLED TO, READ BACK BY AND VERIFIED WITH: PHARMD RACHEL R 1454 [id number] FCP    Methicillin resistance DETECTED (A) NOT DETECTED Final    Comment: CRITICAL RESULT CALLED TO, READ BACK BY AND VERIFIED WITH: PHARMD RACHEL R 1454 [id number] FCP    Streptococcus species NOT DETECTED NOT DETECTED Final   Streptococcus agalactiae NOT DETECTED NOT DETECTED Final   Streptococcus pneumoniae NOT DETECTED NOT DETECTED Final   Streptococcus pyogenes NOT DETECTED NOT DETECTED Final   Acinetobacter baumannii NOT DETECTED NOT DETECTED Final   Enterobacteriaceae species NOT DETECTED NOT DETECTED Final   Enterobacter cloacae complex NOT DETECTED NOT DETECTED Final   Escherichia coli NOT DETECTED NOT DETECTED Final   Klebsiella oxytoca NOT DETECTED NOT DETECTED Final   Klebsiella pneumoniae NOT DETECTED NOT DETECTED Final   Proteus species NOT DETECTED NOT DETECTED Final   Serratia marcescens NOT DETECTED NOT DETECTED Final   Haemophilus influenzae NOT DETECTED NOT DETECTED Final   Neisseria meningitidis NOT DETECTED NOT DETECTED Final   Pseudomonas aeruginosa NOT DETECTED NOT DETECTED Final   Candida  albicans NOT DETECTED NOT DETECTED Final   Candida glabrata NOT DETECTED NOT DETECTED Final   Candida krusei NOT DETECTED NOT DETECTED Final   Candida parapsilosis NOT DETECTED NOT DETECTED Final   Candida tropicalis NOT DETECTED NOT DETECTED Final    Comment: Performed at [facility], [place]  [place], [place] [address]  Culture, blood (Routine x 2)     Status: None (Preliminary result)   Collection Time: 02/25/2018  1:08 PM  Result Value Ref Range Status   Specimen Description BLOOD LEFT HAND BOTTLES DRAWN AEROBIC ONLY  Final   Special Requests   Final    Blood Culture results may not be optimal due to an inadequate volume of blood received in culture bottles   Culture   Final    NO GROWTH < 24 HOURS Performed at [facility], [address] [place], [place] [address]    Report Status PENDING  Incomplete  Respiratory Panel by PCR     Status: None   Collection Time: 02/16/18 11:46 AM  Result Value Ref Range Status   Adenovirus NOT DETECTED NOT DETECTED Final   Coronavirus 229E NOT DETECTED NOT DETECTED Final   Coronavirus HKU1 NOT DETECTED NOT DETECTED Final   Coronavirus NL63 NOT DETECTED NOT DETECTED Final   Coronavirus OC43 NOT DETECTED NOT DETECTED Final   Metapneumovirus NOT DETECTED NOT DETECTED Final   Rhinovirus / Enterovirus NOT DETECTED NOT DETECTED Final   Influenza A NOT DETECTED NOT DETECTED Final   Influenza B NOT DETECTED NOT DETECTED Final   Parainfluenza Virus 1 NOT DETECTED NOT DETECTED Final   Parainfluenza Virus 2 NOT DETECTED NOT DETECTED Final   Parainfluenza Virus 3 NOT DETECTED NOT DETECTED Final   Parainfluenza Virus 4 NOT DETECTED NOT DETECTED Final   Respiratory Syncytial Virus NOT DETECTED NOT DETECTED Final   Bordetella pertussis NOT DETECTED NOT DETECTED Final   Chlamydophila pneumoniae NOT DETECTED NOT DETECTED Final   Mycoplasma pneumoniae NOT DETECTED NOT DETECTED Final    Comment: Performed at [facility], [place] [address], [place], [place] [address]    Mosetta Anis, MD, PGY1  02/17/2018, 8:17 AM

## 2018-02-17 NOTE — Consult Note (Signed)
Cardiology Consultation:   Patient ID: Chris Lopez; [id number]; 01-18-1946   Admit date: 03/02/2018 Date of Consult: 02/17/2018  Primary Care Provider: Celene Squibb, MD Primary Cardiologist: Dr. Nevin Bloodgood [facility]-Remotely 2014  Patient Profile:   Chris Lopez is a 72 y.o. male with a hx of Stage IVb mantle cell lymphoma undergoing chemotherapy with Bendamustine and Rituximab therapy with leukopenia presenting to [facility] on 02/09/2018 with MRSA bacteremia. Also with a hx of anxiety, BPH, chronic bronchitis with COPD, DM 2, GERD and HTN who is being seen today by Cardiology for the evaluation of new onset atrial fibrillaiton with RVR at the request of Dr. Lake Bells.  History of Present Illness:   Chris Lopez is a 72 year old male with a history stated above who initially presented to [facility] on 02/19/2018 with complaints of generalized body aches, fever and altered mental status found to be hypotensive, neutropenic and in septic shock with gram-positive bacteremia. History obtained from chart review as patient does not clearly recall events leading to hospitalization. At [facility] he was started on IV antibiotics with vancomycin and Zosyn, fluid resuscitated and started on vasopressors then transferred to [facility] for further evaluation. Initially he was found to be hypoxic upon presentation with saturations in the mid to low 80s.  He was placed on 100% nonrebreather with follow-up CXR revealing pulmonary vascular congestion. He was given IV Lasix and placed on BiPAP ventilation.  On 02/17/18 he went into atrial fibrillation with RVR.  BP stable but low at 94/63. IV Amiodarone was initiated with load and infusion.  He was given IV Lasix for acute respiratory failure secondary to acute pulmonary edema per CXR. Amiodarone was stopped and diltiazem gtt was initiated with IV bolus.  Anticoagulation was held in the setting of sepsis and thrombocytopenia.  Echocardiogram ordered with pending results. Last echocardiogram from  10/18/2016 with normal LV function at 55 to 60% with no wall motion abnormalities.  Of note he was last seen by his primary cardiologist greater than 5 years ago. During that visit, he had complaints of mid chest pain with radiation to his neck.  Given the symptoms, he underwent a a stress test on 12/19/2012 which was found to be normal.  Cardiologist been consulted given new onset AF with RVR.  Past Medical History:  Diagnosis Date  . Anxiety   . Arthritis   . BPH (benign prostatic hyperplasia)   . Chronic bronchitis   . COPD (chronic obstructive pulmonary disease) ([place])   . Depression   . Diabetes mellitus without complication ([place])    Resolved; not needing medications at this time.  Marland Kitchen GERD (gastroesophageal reflux disease)   . Hypertension   . Lymphoma, mantle cell ([place])     Past Surgical History:  Procedure Laterality Date  . BIOPSY  10/07/2017   Procedure: BIOPSY;  Surgeon: Daneil Dolin, MD;  Location[facility];  Service: Endoscopy;;  gastroesophageal junction bx ileocecal valve  . carpal tunnel right hand    . CERVICAL FUSION    . COLONOSCOPY WITH PROPOFOL N/A 10/07/2017   Procedure: COLONOSCOPY WITH PROPOFOL;  Surgeon: Daneil Dolin, MD;  Location: [facility];  Service: Endoscopy;  Laterality: N/A;  10:00am  . ESOPHAGOGASTRODUODENOSCOPY (EGD) WITH PROPOFOL N/A 10/07/2017   Procedure: ESOPHAGOGASTRODUODENOSCOPY (EGD) WITH PROPOFOL;  Surgeon: Daneil Dolin, MD;  Location: [facility];  Service: Endoscopy;  Laterality: N/A;  . IR IMAGING GUIDED PORT INSERTION  10/30/2017  . left knee replacement    .  left knee revised X 3    . multiple knee arthroscopies    . POLYPECTOMY  10/07/2017   Procedure: POLYPECTOMY;  Surgeon: Daneil Dolin, MD;  Location: [facility];  Service: Endoscopy;;  colon  . right knee replacement    . tarsal tunnel repair    . THYROIDECTOMY, PARTIAL    . TONSILLECTOMY      Prior to Admission medications   Medication Sig Start Date End Date  Taking? Authorizing Provider  albuterol (PROVENTIL HFA;VENTOLIN HFA) 108 (90 Base) MCG/ACT inhaler Inhale 2 puffs into the lungs every 6 (six) hours as needed for wheezing or shortness of breath.   Yes [provider]  amLODipine (NORVASC) 5 MG tablet Take 5 mg by mouth every evening. 07/01/17  Yes [provider]  aspirin 325 MG tablet Take 325 mg by mouth daily.    Yes [provider]  atorvastatin (LIPITOR) 20 MG tablet Take 20 mg by mouth every evening. Reported on 06/07/2015   Yes [provider]  bendamustine in sodium chloride 0.9 % 50 mL Inject into the vein once.   Yes [provider]  diclofenac (VOLTAREN) 75 MG EC tablet TAKE 1 TABLET TWICE DAILY 02/18/17  Yes Sanjuana Kava, MD  diphenhydrAMINE (BENADRYL) 25 MG tablet Take 1 tablet (25 mg total) by mouth every 6 (six) hours as needed for itching. 12/30/17  Yes Higgs, Mathis Dad, MD  gabapentin (NEURONTIN) 600 MG tablet Take 600 mg by mouth 3 (three) times daily.   Yes [provider]  lidocaine-prilocaine (EMLA) cream Apply to affected area once 10/31/17  Yes Derek Jack, MD  LORazepam (ATIVAN) 2 MG tablet Take 2 mg by mouth at bedtime. 08/15/17  Yes [provider]  metoprolol (LOPRESSOR) 50 MG tablet Take 50 mg by mouth every evening.    Yes [provider]  nitroGLYCERIN (NITROSTAT) 0.4 MG SL tablet Place 0.4 mg under the tongue every 5 (five) minutes x 3 doses as needed. For chest pain   Yes [provider]  omeprazole (PRILOSEC OTC) 20 MG tablet Take 20 mg by mouth 2 (two) times daily. Reported on 06/07/2015   Yes [provider]  oxyCODONE-acetaminophen (PERCOCET) 7.5-325 MG tablet Take 1 tablet by mouth every 4 (four) hours as needed. 02/06/18  Yes Sanjuana Kava, MD  prochlorperazine (COMPAZINE) 10 MG tablet Take 1 tablet (10 mg total) by mouth every 6 (six) hours as needed (Nausea or vomiting). 10/31/17  Yes Derek Jack, MD  riTUXimab  (RITUXAN IV) Inject into the vein.   Yes [provider]  Tamsulosin HCl (FLOMAX) 0.4 MG CAPS Take 0.4 mg by mouth daily. Reported on 06/07/2015   Yes [provider]  venlafaxine (EFFEXOR-XR) 150 MG 24 hr capsule Take 150 mg by mouth every evening.    Yes [provider]  acyclovir (ZOVIRAX) 400 MG tablet Take 1 tablet (400 mg total) by mouth 2 (two) times daily. 01/28/18   Baird Cancer, PA-C  allopurinol (ZYLOPRIM) 300 MG tablet Take 1 tablet (300 mg total) by mouth daily. Patient not taking: Reported on 02/16/2018 10/31/17   Derek Jack, MD    Inpatient Medications: Scheduled Meds: . furosemide  40 mg Intravenous Q6H  . gabapentin  300 mg Oral Daily  . ipratropium-albuterol  3 mL Nebulization Q6H  . mouth rinse  15 mL Mouth Rinse BID   Continuous Infusions: . sodium chloride    . sodium chloride    . DAPTOmycin (CUBICIN)  IV    .  diltiazem (CARDIZEM) infusion 5 mg/hr (02/17/18 1106)  . famotidine (PEPCID) IV 20 mg (02/16/18 2059)  . norepinephrine (LEVOPHED) Adult infusion     PRN Meds: acetaminophen, oxyCODONE-acetaminophen **AND** oxyCODONE  Allergies:    Allergies  Allergen Reactions  . Bee Venom Anaphylaxis    Allergic to bee stings not the kit  . Feldene [Piroxicam] Rash and Dermatitis    Social History:   Social History   Socioeconomic History  . Marital status: Married    Spouse name: Jonelle Sidle  . Number of children: 4  . Years of education: Not on file  . Highest education level: Bachelor's degree (e.g., BA, AB, BS)  Occupational History  . 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 Financial resource strain: Somewhat hard  . Food insecurity:    Worry: Sometimes true    Inability: Never true  . Transportation needs:    Medical: No    Non-medical: No  Tobacco Use  . Smoking  status: Current Every Day Smoker    Packs/day: 1.25    Years: 50.00    Pack years: 62.50    Types: Cigarettes  . Smokeless tobacco: Never Used  Substance and Sexual Activity  . Alcohol use: No  . Drug use: No  . Sexual activity: Yes    Partners: Female    Birth control/protection: None    Comment: spouse  Lifestyle  . Physical activity:    Days per week: 3 days    Minutes per session: 150+ min  . Stress: Very much  Relationships  . Social connections:    Talks on phone: Once a week    Gets together: Never    Attends religious service: Never    Active member of club or organization: No    Attends meetings of clubs or organizations: Never    Relationship status: Married  . Intimate partner violence:    Fear of current or ex partner: No    Emotionally abused: No    Physically abused: No    Forced sexual activity: No  Other Topics Concern  . Not on file  Social History Narrative  . Not on file    Family History:   Family History  Problem Relation Age of Onset  . Colon cancer Maternal Grandmother   . Colon cancer Paternal Grandmother   . Heart disease Mother   . Arthritis Mother   . Prostate cancer Father   . Heart attack Brother   . Heart disease Maternal Aunt   . Cancer Maternal Aunt   . Heart disease Maternal Uncle   . Cancer Maternal Uncle   . Heart disease Paternal Aunt   . Heart disease Paternal Uncle   . Heart disease Brother   . Heart attack Brother   . Arthritis Brother    Family Status:  Family Status  Relation Name Status  . MGM  (Not Specified)  . PGM  (Not Specified)  . Mother  Deceased  . Father  Deceased  . Brother Herbie Baltimore Deceased  . Mat Aunt  (Not Specified)  . Mat Uncle  (Not Specified)  . Ethlyn Daniels  (Not Specified)  . Annamarie Major  (Not Specified)  . Brother [employer]  . Brother  Alive  . Son  Alive  . Son  Alive  . Daughter  Alive  .  Daughter  Alive    ROS:  Please see the history of present illness.  All other ROS reviewed  and negative.     Physical Exam/Data:   Vitals:   02/17/18 1000 02/17/18 1100 02/17/18 1128 02/17/18 1200  BP: 107/64 106/65  (!) 124/59  Pulse: (!) 134 (!) 121  (!) 139  Resp: 18 (!) 21  (!) 27  Temp:   99.8 F (37.7 C)   TempSrc:   Axillary   SpO2: (!) 87% 94%  (!) 86%  Weight:      Height:        Intake/Output Summary (Last 24 hours) at 02/17/2018 1243 Last data filed at 02/17/2018 1200 Gross per 24 hour  Intake 1731.27 ml  Output 3775 ml  Net -2043.73 ml   Filed Weights   02/18/2018 1245 02/16/18 0500 02/17/18 0500  Weight: 95.3 kg 101.8 kg 100.7 kg   Body mass index is 30.11 kg/m.   General: Ill-appearing, NAD Skin: Warm, dry, intact  Head: Normocephalic, atraumatic,clear, moist mucus membranes. Neck: Negative for carotid bruits. No JVD Lungs: Bilateral rhonchi. No wheezes. Breathing is mildly labored with NRB in place.  Cardiovascular: Irregularly irregular with S1 S2. No murmurs, rubs, gallops, or LV heave appreciated. Abdomen: Soft, non-tender, distended with normoactive bowel sounds. No obvious abdominal masses. MSK: Strength and tone appear normal for age. 5/5 in all extremities Extremities: No edema. No clubbing or cyanosis. DP/PT pulses 2+ bilaterally Neuro: Alert and oriented. No focal deficits. No facial asymmetry. MAE spontaneously. Psych: Responds to questions appropriately with normal affect.    EKG:  The EKG was personally reviewed and demonstrates: 12/18/2017 AF with RVR 179 Telemetry:  Telemetry was personally reviewed and demonstrates:  02/17/18 AF HR remains in the 120-130's   Relevant CV Studies:  ECHO: 10/18/2017: Study Conclusions  - Left ventricle: The cavity size was normal. Wall thickness was   normal. Systolic function was normal. The estimated ejection   fraction was in the range of 55% to 60%. Wall motion was normal;   there were no regional wall motion abnormalities. Left   ventricular diastolic function parameters were normal. -  Aortic valve: Valve area (VTI): 2.29 cm^2. Valve area (Vmax):   2.29 cm^2. - Left atrium: The atrium was mildly dilated. - Technically adequate study.  02/17/2018: Pending  CATH: None  Stress test: 12/19/2012, normal  Laboratory Data:  Chemistry Recent Labs  Lab 02/16/18 0504 02/17/18 0142 02/17/18 1056  NA 127* 132* 132*  K 3.7 3.2* 3.1*  CL 97* 98 98  CO2 18* 22 20*  GLUCOSE 94 107* 175*  BUN 58* 61* 64*  CREATININE 3.37* 3.15* 3.07*  CALCIUM 7.1* 7.4* 7.5*  GFRNONAA 17* 18* 19*  GFRAA 20* 21* 22*  ANIONGAP 12 12 14     Total Protein  Date Value Ref Range Status  02/07/2018 5.4 (L) 6.5 - 8.1 g/dL Final   Albumin  Date Value Ref Range Status  03/05/2018 2.6 (L) 3.5 - 5.0 g/dL Final   AST  Date Value Ref Range Status  03/06/2018 40 15 - 41 U/L Final   ALT  Date Value Ref Range Status  02/16/2018 24 0 - 44 U/L Final   Alkaline Phosphatase  Date Value Ref Range Status  02/13/2018 71 38 - 126 U/L Final   Total Bilirubin  Date Value Ref Range Status  03/05/2018 1.2 0.3 - 1.2 mg/dL Final   Hematology Recent Labs  Lab 03/03/2018 1253 02/16/18 0504 02/17/18 0142  WBC 2.1* 1.2*  1.7*  RBC 3.02* 2.89* 2.88*  HGB 9.1* 8.9* 8.7*  HCT 27.4* 25.8* 25.3*  MCV 90.7 89.3 87.8  MCH 30.1 30.8 30.2  MCHC 33.2 34.5 34.4  RDW 16.7* 16.7* 16.8*  PLT 58* 51* 44*   Cardiac Enzymes Recent Labs  Lab 03-01-18 1123 March 01, 2018 1834 02/17/18 0142  TROPONINI 0.25* 0.24* 0.24*   No results for input(s): TROPIPOC in the last 168 hours.  BNPNo results for input(s): BNP, PROBNP in the last 168 hours.  DDimer No results for input(s): DDIMER in the last 168 hours. TSH: No results found for: TSH Lipids: Lab Results  Component Value Date   CHOL  03/28/2008    175        ATP III CLASSIFICATION:  <200     mg/dL   Desirable  200-239  mg/dL   Borderline High  >=240    mg/dL   High   HDL 35 (L) 03/28/2008   LDLCALC (H) 03/28/2008    124        Total Cholesterol/HDL:CHD  Risk Coronary Heart Disease Risk Table                     Men   Women  1/2 Average Risk   3.4   3.3   TRIG 78 03/28/2008   CHOLHDL 5.0 03/28/2008   HgbA1c:No results found for: HGBA1C  Radiology/Studies:  US Renal  Result Date: 03/01/18 CLINICAL DATA:  Acute renal failure. EXAM: RENAL / URINARY TRACT ULTRASOUND COMPLETE COMPARISON:  None. FINDINGS: Right Kidney: Renal measurements: 12.9 x 6.7 x 6.5 cm = volume: 291 mL . Echogenicity within normal limits. No mass or hydronephrosis visualized. Left Kidney: Renal measurements: 12.6 x 6.5 x 6.4 cm = volume: 272 mL. 6.3 cm simple cyst is seen arising from lower pole. Echogenicity within normal limits. No mass or hydronephrosis visualized. Bladder: Urinary bladder is decompressed secondary to Foley catheter. Incidental note is made of moderate splenomegaly. IMPRESSION: Simple left renal cyst.  No other renal abnormality seen. Incidental note is made of moderate splenomegaly. Electronically Signed   By: Marijo Conception, M.D.   On: 03/01/2018 16:56   Dg Chest Port 1 View  Result Date: 02/17/2018 CLINICAL DATA:  Acute respiratory failure EXAM: PORTABLE CHEST 1 VIEW COMPARISON:  Yesterday FINDINGS: Right-sided port catheter with tip at the upper right atrium. Low volumes with generalized interstitial coarsening. Asymmetric opacity in the right mid lower lung slight perihilar improvement from before. No effusion or pneumothorax.  Normal heart size. IMPRESSION: 1. Low volumes with bilateral interstitial coarsening favoring edema. 2. Asymmetric hazy lung opacity which may be atelectasis or pneumonia. Electronically Signed   By: Monte Fantasia M.D.   On: 02/17/2018 07:08   Dg Chest Port 1 View  Result Date: 01-Mar-2018 CLINICAL DATA:  Acute onset of hypoxia. EXAM: PORTABLE CHEST 1 VIEW COMPARISON:  Chest radiograph performed 02/14/2018 FINDINGS: New right midlung and right basilar airspace opacity raises concern for pneumonia. Underlying vascular  congestion and increased interstitial markings are seen, and superimposed pulmonary edema cannot be excluded. No definite pleural effusion or pneumothorax is seen, though the left costophrenic angle is incompletely imaged on this study. The cardiomediastinal silhouette is borderline normal in size. No acute osseous abnormalities are seen. Cervical spinal fusion hardware is noted. A right-sided chest port is noted ending about the cavoatrial junction. IMPRESSION: New right midlung and right basilar airspace opacity raises concern for pneumonia. Underlying vascular congestion and increased interstitial markings seen, and superimposed pulmonary  edema cannot be excluded. Electronically Signed   By: Garald Balding M.D.   On: 02/16/2018 03:35   Dg Chest Port 1 View  Result Date: 03/03/2018 CLINICAL DATA:  Cough for several days. History of non-Hodgkin's lymphoma. Chemotherapy 2 weeks ago. EXAM: PORTABLE CHEST 1 VIEW COMPARISON:  PET-CT, 10/28/2017.  Chest radiographs, 06/29/2010. FINDINGS: Cardiac silhouette mildly enlarged.  No mediastinal or hilar masses. Lung volumes are low. There are prominent bronchovascular markings. No evidence of pneumonia or pulmonary edema. No pleural effusion or pneumothorax. Right anterior chest wall internal jugular power Port-A-Cath tip projects in the right atrium. No acute skeletal abnormality. IMPRESSION: No acute cardiopulmonary disease. Electronically Signed   By: Lajean Manes M.D.   On: 03/07/2018 14:11   Assessment and Plan:   1.  New onset atrial fibrillation with RVR: -Patient presented from [facility] with severe sepsis and septic shock transferred to [facility] on 03/01/2018.  He was found to be in atrial fibrillation with RVR on 02/17/2017.  Amiodarone gtt with load and infusion initiated then transition to diltiazem gtt -Currently in AF with rates in the 120-130's with hypotension, SBP in the 80's  -Not currently on anticoagulation secondary to severe sepsis and  thrombocytopenia -Will stop diltiazem and restart Amio with 150 load and monitor closely. Likely will need to restart pressors for BP support until rate better controlled. At this point, poor respiratory status and hypotension likely in the setting of AF with RVR.  -CHA2DS2VASc = 3 (age, HTN, DM2)  2. Severe sepsis with GPC bacteremia likely in the setting of infected Port-A-Cath: -Placed on IV vancomycin, Zosyn per primary team -Initially on Levophed gtt secondary to hypotension>>will likely need to restart in the setting of hypotension and need for reload Amiodarone  -IR consultation with plan for Port-A-Cath removal 02/18/2018 secondary to above -Management per primary team  3. Acute respiratory failure, possible pneumonia: -Initial saturations in the low to mid 80s placed on BiPAP ventilation>>>currently on NRB with saturations in the low 90's   4. Acute pulmonary edema: -Echocardiogram with pending results -Continue IV Lasix 40 mg every 6H -Monitor renal function closely  5. Acute kidney injury: -Creatinine, worsening creatinine, 3.07 today -Remote baseline appears to be in the 1.0 range -I&O, net +365 mL -Renal ultrasound, completed 02/16/2018 with simple left renal cyst and no other renal abnormality with moderate splenomegaly -Monitor with daily BMET -Continue with IV Lasix 40 mg every 6 per primary team, goal euvolemic status   For questions or updates, please contact [facility] Please consult [email or web address] for contact info under Cardiology/STEMI.   SignedKathyrn Drown NP-C [facility] Pager: [phone] 02/17/2018 12:43 PM

## 2018-02-17 NOTE — Consult Note (Signed)
Chief Complaint: Patient was seen in consultation today for [facility] a cath removal Chief Complaint  Patient presents with  . Fatigue   at the request of Dr Despina Hick   Supervising Physician: Daryll Brod  Patient Status: [facility] - In-pt  History of Present Illness: Chris Lopez is a 72 y.o. male   Mantle cell lymphoma Chemotherapy  General weakness and aches Sepsis  +GPC Admitted to [facility]- Tx to [facility] On vent  PAC placed 10/30/17 IR at [facility]  Pancytopenia; neutropenia secondary chemo  Dr Lake Bells requesting PAC removal Pt has eaten today Discussed with Dr Lake Bells--- would like removal 11/12 anyway for diuresis tonight  Scheduled for removal in IR 11/12  Past Medical History:  Diagnosis Date  . Anxiety   . Arthritis   . BPH (benign prostatic hyperplasia)   . Chronic bronchitis   . COPD (chronic obstructive pulmonary disease) ([place])   . Depression   . Diabetes mellitus without complication ([place])    Resolved; not needing medications at this time.  Marland Kitchen GERD (gastroesophageal reflux disease)   . Hypertension   . Lymphoma, mantle cell ([place])     Past Surgical History:  Procedure Laterality Date  . BIOPSY  10/07/2017   Procedure: BIOPSY;  Surgeon: Daneil Dolin, MD;  Location: [facility];  Service: Endoscopy;;  gastroesophageal junction bx ileocecal valve  . carpal tunnel right hand    . CERVICAL FUSION    . COLONOSCOPY WITH PROPOFOL N/A 10/07/2017   Procedure: COLONOSCOPY WITH PROPOFOL;  Surgeon: Daneil Dolin, MD;  Location: [facility];  Service: Endoscopy;  Laterality: N/A;  10:00am  . ESOPHAGOGASTRODUODENOSCOPY (EGD) WITH PROPOFOL N/A 10/07/2017   Procedure: ESOPHAGOGASTRODUODENOSCOPY (EGD) WITH PROPOFOL;  Surgeon: Daneil Dolin, MD;  Location: [facility];  Service: Endoscopy;  Laterality: N/A;  . IR IMAGING GUIDED PORT INSERTION  10/30/2017  . left knee replacement    . left knee revised X 3    . multiple knee arthroscopies    . POLYPECTOMY  10/07/2017   Procedure: POLYPECTOMY;  Surgeon: Daneil Dolin, MD;  Location: [facility];  Service: Endoscopy;;  colon  . right knee replacement    . tarsal tunnel repair    . THYROIDECTOMY, PARTIAL    . TONSILLECTOMY      Allergies: Bee venom and Feldene [piroxicam]  Medications: Prior to Admission medications   Medication Sig Start Date End Date Taking? Authorizing Provider  albuterol (PROVENTIL HFA;VENTOLIN HFA) 108 (90 Base) MCG/ACT inhaler Inhale 2 puffs into the lungs every 6 (six) hours as needed for wheezing or shortness of breath.   Yes [provider]  amLODipine (NORVASC) 5 MG tablet Take 5 mg by mouth every evening. 07/01/17  Yes [provider]  aspirin 325 MG tablet Take 325 mg by mouth daily.    Yes [provider]  atorvastatin (LIPITOR) 20 MG tablet Take 20 mg by mouth every evening. Reported on 06/07/2015   Yes [provider]  bendamustine in sodium chloride 0.9 % 50 mL Inject into the vein once.   Yes [provider]  diclofenac (VOLTAREN) 75 MG EC tablet TAKE 1 TABLET TWICE DAILY 02/18/17  Yes Sanjuana Kava, MD  diphenhydrAMINE (BENADRYL) 25 MG tablet Take 1 tablet (25 mg total) by mouth every 6 (six) hours as needed for itching. 12/30/17  Yes Higgs, Mathis Dad, MD  gabapentin (NEURONTIN) 600 MG tablet Take 600 mg by mouth 3 (three) times daily.   Yes [provider]  lidocaine-prilocaine (EMLA) cream Apply to affected area once 10/31/17  Yes Derek Jack, MD  LORazepam (ATIVAN) 2 MG tablet Take 2 mg by mouth at bedtime. 08/15/17  Yes [provider]  metoprolol (LOPRESSOR) 50 MG tablet Take 50 mg by mouth every evening.    Yes [provider]  nitroGLYCERIN (NITROSTAT) 0.4 MG SL tablet Place 0.4 mg under the tongue every 5 (five) minutes x 3 doses as needed. For chest pain   Yes [provider]  omeprazole (PRILOSEC OTC) 20 MG tablet Take 20 mg by mouth 2 (two) times daily. Reported on 06/07/2015   Yes  [provider]  oxyCODONE-acetaminophen (PERCOCET) 7.5-325 MG tablet Take 1 tablet by mouth every 4 (four) hours as needed. 02/06/18  Yes Sanjuana Kava, MD  prochlorperazine (COMPAZINE) 10 MG tablet Take 1 tablet (10 mg total) by mouth every 6 (six) hours as needed (Nausea or vomiting). 10/31/17  Yes Derek Jack, MD  riTUXimab (RITUXAN IV) Inject into the vein.   Yes [provider]  Tamsulosin HCl (FLOMAX) 0.4 MG CAPS Take 0.4 mg by mouth daily. Reported on 06/07/2015   Yes [provider]  venlafaxine (EFFEXOR-XR) 150 MG 24 hr capsule Take 150 mg by mouth every evening.    Yes [provider]  acyclovir (ZOVIRAX) 400 MG tablet Take 1 tablet (400 mg total) by mouth 2 (two) times daily. 01/28/18   Baird Cancer, PA-C  allopurinol (ZYLOPRIM) 300 MG tablet Take 1 tablet (300 mg total) by mouth daily. Patient not taking: Reported on 02/16/2018 10/31/17   Derek Jack, MD     Family History  Problem Relation Age of Onset  . Colon cancer Maternal Grandmother   . Colon cancer Paternal Grandmother   . Heart disease Mother   . Arthritis Mother   . Prostate cancer Father   . Heart attack Brother   . Heart disease Maternal Aunt   . Cancer Maternal Aunt   . Heart disease Maternal Uncle   . Cancer Maternal Uncle   . Heart disease Paternal Aunt   . Heart disease Paternal Uncle   . Heart disease Brother   . Heart attack Brother   . Arthritis Brother     Social History   Socioeconomic History  . Marital status: Married    Spouse name: Jonelle Sidle  . Number of children: 4  . Years of education: Not on file  . Highest education level: Bachelor's degree (e.g., BA, AB, BS)  Occupational History  . 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 Financial resource strain: Somewhat hard  . Food insecurity:     Worry: Sometimes true    Inability: Never true  . Transportation needs:    Medical: No    Non-medical: No  Tobacco Use  . Smoking status: Current Every Day Smoker    Packs/day: 1.25    Years: 50.00    Pack years: 62.50    Types: Cigarettes  . Smokeless tobacco: Never Used  Substance and Sexual Activity  . Alcohol use: No  . Drug use: No  . Sexual activity: Yes    Partners: Female    Birth control/protection: None    Comment: spouse  Lifestyle  . Physical activity:    Days per week: 3 days    Minutes per session: 150+ min  .  Stress: Very much  Relationships  . Social connections:    Talks on phone: Once a week    Gets together: Never    Attends religious service: Never    Active member of club or organization: No    Attends meetings of clubs or organizations: Never    Relationship status: Married  Other Topics Concern  . Not on file  Social History Narrative  . Not on file    Review of Systems: A 12 point ROS discussed and pertinent positives are indicated in the HPI above.  All other systems are negative.  Review of Systems  Constitutional: Positive for activity change, appetite change, diaphoresis, fatigue and fever.  Respiratory: Positive for shortness of breath.   Neurological: Positive for weakness.  Psychiatric/Behavioral: Positive for decreased concentration.    Vital Signs: BP 106/65   Pulse (!) 121   Temp 99.8 F (37.7 C) (Axillary)   Resp (!) 21   Ht 6' (1.829 m)   Wt 222 lb 0.1 oz (100.7 kg)   SpO2 94%   BMI 30.11 kg/m   Physical Exam  Cardiovascular: Normal rate and regular rhythm.  Pulmonary/Chest:  vent  Abdominal: Soft. Bowel sounds are normal.  Musculoskeletal:  Can follow few commands   Skin: Skin is warm and dry.  Psychiatric:  Wife at bedside Consented for [facility] removal  Vitals reviewed.   Imaging: US Renal  Result Date: 02/16/2018 CLINICAL DATA:  Acute renal failure. EXAM: RENAL / URINARY TRACT ULTRASOUND COMPLETE  COMPARISON:  None. FINDINGS: Right Kidney: Renal measurements: 12.9 x 6.7 x 6.5 cm = volume: 291 mL . Echogenicity within normal limits. No mass or hydronephrosis visualized. Left Kidney: Renal measurements: 12.6 x 6.5 x 6.4 cm = volume: 272 mL. 6.3 cm simple cyst is seen arising from lower pole. Echogenicity within normal limits. No mass or hydronephrosis visualized. Bladder: Urinary bladder is decompressed secondary to Foley catheter. Incidental note is made of moderate splenomegaly. IMPRESSION: Simple left renal cyst.  No other renal abnormality seen. Incidental note is made of moderate splenomegaly. Electronically Signed   By: Marijo Conception, M.D.   On: 02/16/2018 16:56   Nm Pet Image Restag (ps) Skull Base To Thigh  Result Date: 01/21/2018 CLINICAL DATA:  Subsequent treatment strategy for mantle cell lymphoma with interval chemotherapy. EXAM: NUCLEAR MEDICINE PET SKULL BASE TO THIGH TECHNIQUE: 13.8 mCi F-18 FDG was injected intravenously. Full-ring PET imaging was performed from the skull base to thigh after the radiotracer. CT data was obtained and used for attenuation correction and anatomic localization. Fasting blood glucose: 112 mg/dl COMPARISON:  10/28/2017 PET-CT. FINDINGS: Mediastinal blood pool activity: SUV max 1.5 NECK: There is mild hypermetabolism within a 0.7 cm lymph node at the inferior margin of the right parotid gland with max SUV 2.3, previously 1.3 cm with max SUV 10.0, decreased in size and significantly decreased in metabolism. No additional residual hypermetabolic lymph nodes in the neck. Incidental CT findings: Right hemithyroidectomy. CHEST: Hypermetabolic 0.9 cm left axillary lymph node with max SUV 3.9 (series 3/image 81), previously 1.4 cm with max SUV 4.7, decreased in size and metabolism. No additional hypermetabolic axillary, mediastinal or hilar lymph nodes. No hypermetabolic pulmonary findings. Incidental CT findings: Right internal jugular MediPort terminates at the  cavoatrial junction. Three-vessel coronary atherosclerosis. Atherosclerotic nonaneurysmal thoracic aorta. Coarsely calcified subcarinal and left hilar nodes from prior granulomatous disease. Mild centrilobular emphysema with diffuse bronchial wall thickening. No acute consolidative airspace disease, lung masses or significant pulmonary nodules. ABDOMEN/PELVIS: No  abnormal hypermetabolic activity within the liver, pancreas, adrenal glands, or spleen. No residual hypermetabolic or enlarged lymph nodes in the abdomen or pelvis. Incidental CT findings: Scattered granulomatous calcifications in the liver and spleen. Normal size spleen. Scattered subcentimeter hypodense liver lesions are too small to characterize and are stable, probably benign. Moderate sigmoid diverticulosis. Atherosclerotic nonaneurysmal abdominal aorta. Moderately enlarged prostate with nonspecific internal prostatic calcifications. Simple exophytic 4.9 cm posterior lower left renal cyst. SKELETON: No focal hypermetabolic activity to suggest skeletal metastasis. Incidental CT findings: Surgical hardware from [facility] is noted in the cervical spine. IMPRESSION: 1. Significant partial metabolic response, with mild residual hypermetabolism within right neck and left axillary lymph nodes. No new or progressive disease. [place] 4. 2. Aortic Atherosclerosis (ICD10-I70.0) and Emphysema (ICD10-J43.9). Electronically Signed   By: Ilona Sorrel M.D.   On: 01/21/2018 09:04   Dg Chest Port 1 View  Result Date: 02/17/2018 CLINICAL DATA:  Acute respiratory failure EXAM: PORTABLE CHEST 1 VIEW COMPARISON:  Yesterday FINDINGS: Right-sided port catheter with tip at the upper right atrium. Low volumes with generalized interstitial coarsening. Asymmetric opacity in the right mid lower lung slight perihilar improvement from before. No effusion or pneumothorax.  Normal heart size. IMPRESSION: 1. Low volumes with bilateral interstitial coarsening favoring edema. 2.  Asymmetric hazy lung opacity which may be atelectasis or pneumonia. Electronically Signed   By: Monte Fantasia M.D.   On: 02/17/2018 07:08   Dg Chest Port 1 View  Result Date: 02/16/2018 CLINICAL DATA:  Acute onset of hypoxia. EXAM: PORTABLE CHEST 1 VIEW COMPARISON:  Chest radiograph performed 02/28/2018 FINDINGS: New right midlung and right basilar airspace opacity raises concern for pneumonia. Underlying vascular congestion and increased interstitial markings are seen, and superimposed pulmonary edema cannot be excluded. No definite pleural effusion or pneumothorax is seen, though the left costophrenic angle is incompletely imaged on this study. The cardiomediastinal silhouette is borderline normal in size. No acute osseous abnormalities are seen. Cervical spinal fusion hardware is noted. A right-sided chest port is noted ending about the cavoatrial junction. IMPRESSION: New right midlung and right basilar airspace opacity raises concern for pneumonia. Underlying vascular congestion and increased interstitial markings seen, and superimposed pulmonary edema cannot be excluded. Electronically Signed   By: Garald Balding M.D.   On: 02/16/2018 03:35   Dg Chest Port 1 View  Result Date: 02/07/2018 CLINICAL DATA:  Cough for several days. History of non-Hodgkin's lymphoma. Chemotherapy 2 weeks ago. EXAM: PORTABLE CHEST 1 VIEW COMPARISON:  PET-CT, 10/28/2017.  Chest radiographs, 06/29/2010. FINDINGS: Cardiac silhouette mildly enlarged.  No mediastinal or hilar masses. Lung volumes are low. There are prominent bronchovascular markings. No evidence of pneumonia or pulmonary edema. No pleural effusion or pneumothorax. Right anterior chest wall internal jugular power Port-A-Cath tip projects in the right atrium. No acute skeletal abnormality. IMPRESSION: No acute cardiopulmonary disease. Electronically Signed   By: Lajean Manes M.D.   On: 03/03/2018 14:11    Labs:  CBC: Recent Labs    01/28/18 0821  02/22/2018 1253 02/16/18 0504 02/17/18 0142  WBC 5.9 2.1* 1.2* 1.7*  HGB 12.3* 9.1* 8.9* 8.7*  HCT 38.0* 27.4* 25.8* 25.3*  PLT 108* 58* 51* 44*    COAGS: Recent Labs    10/25/17 0730 10/30/17 0740 02/25/2018 1253  INR 0.92 0.97 1.27    BMP: Recent Labs    01/28/18 0821 03/03/2018 1253 02/16/18 0504 02/17/18 0142  NA 138 124* 127* 132*  K 4.4 4.2 3.7 3.2*  CL 104 92* 97* 98  CO2 25 20* 18* 22  GLUCOSE 112* 90 94 107*  BUN 11 55* 58* 61*  CALCIUM 8.8* 7.2* 7.1* 7.4*  CREATININE 1.07 3.56* 3.37* 3.15*  GFRNONAA >60 16* 17* 18*  GFRAA >60 18* 20* 21*    LIVER FUNCTION TESTS: Recent Labs    12/02/17 0804 12/30/17 0822 01/28/18 0821 02/10/2018 1253  BILITOT 0.7 0.5 0.6 1.2  AST 19 18 19  40  ALT 16 15 17 24   ALKPHOS 74 76 79 71  PROT 6.6 6.3* 6.6 5.4*  ALBUMIN 3.7 3.6 3.7 2.6*    TUMOR MARKERS: No results for input(s): AFPTM, CEA, CA199, CHROMGRNA in the last 8760 hours.  Assessment and Plan:  Mantle cell lymphoma Chemotherapy through [facility] a cath placed in IR 10/30/17 New onset fever; weakness + sepsis Scheduled for Port a cath removal 11/12 Pt and wife aware of procedure benefits and risks including but not limited to: infection; bleeding; vessel damage Agreeable to proceed Consent signed and inchart   Thank you for this interesting consult.  I greatly enjoyed meeting KOHL POLINSKY and look forward to participating in their care.  A copy of this report was sent to the requesting provider on this date.  Electronically Signed: Lavonia Drafts, PA-C 02/17/2018, 11:31 AM   I spent a total of 40 Minutes    in face to face in clinical consultation, greater than 50% of which was counseling/coordinating care for [facility] removal

## 2018-02-18 ENCOUNTER — Encounter: Payer: Self-pay | Admitting: Orthopaedic Surgery

## 2018-02-18 ENCOUNTER — Ambulatory Visit: Payer: Medicare HMO | Admitting: Orthopaedic Surgery

## 2018-02-18 ENCOUNTER — Inpatient Hospital Stay (HOSPITAL_COMMUNITY): Payer: Medicare HMO

## 2018-02-18 ENCOUNTER — Encounter (HOSPITAL_COMMUNITY): Payer: Self-pay | Admitting: Diagnostic Radiology

## 2018-02-18 DIAGNOSIS — R7881 Bacteremia: Secondary | ICD-10-CM

## 2018-02-18 DIAGNOSIS — Z886 Allergy status to analgesic agent status: Secondary | ICD-10-CM

## 2018-02-18 DIAGNOSIS — Z95828 Presence of other vascular implants and grafts: Secondary | ICD-10-CM

## 2018-02-18 DIAGNOSIS — I4891 Unspecified atrial fibrillation: Secondary | ICD-10-CM

## 2018-02-18 DIAGNOSIS — R110 Nausea: Secondary | ICD-10-CM

## 2018-02-18 DIAGNOSIS — B9562 Methicillin resistant Staphylococcus aureus infection as the cause of diseases classified elsewhere: Secondary | ICD-10-CM

## 2018-02-18 DIAGNOSIS — D72819 Decreased white blood cell count, unspecified: Secondary | ICD-10-CM

## 2018-02-18 DIAGNOSIS — R197 Diarrhea, unspecified: Secondary | ICD-10-CM

## 2018-02-18 DIAGNOSIS — Z91030 Bee allergy status: Secondary | ICD-10-CM

## 2018-02-18 DIAGNOSIS — R0989 Other specified symptoms and signs involving the circulatory and respiratory systems: Secondary | ICD-10-CM

## 2018-02-18 DIAGNOSIS — R609 Edema, unspecified: Secondary | ICD-10-CM

## 2018-02-18 DIAGNOSIS — N289 Disorder of kidney and ureter, unspecified: Secondary | ICD-10-CM

## 2018-02-18 DIAGNOSIS — J9602 Acute respiratory failure with hypercapnia: Secondary | ICD-10-CM

## 2018-02-18 DIAGNOSIS — F17210 Nicotine dependence, cigarettes, uncomplicated: Secondary | ICD-10-CM

## 2018-02-18 DIAGNOSIS — R7989 Other specified abnormal findings of blood chemistry: Secondary | ICD-10-CM

## 2018-02-18 HISTORY — PX: IR REMOVAL TUN ACCESS W/ PORT W/O FL MOD SED: IMG2290

## 2018-02-18 LAB — CBC WITH DIFFERENTIAL/PLATELET
Abs Immature Granulocytes: 0.21 10*3/uL — ABNORMAL HIGH (ref 0.00–0.07)
BASOS ABS: 0 10*3/uL (ref 0.0–0.1)
Basophils Relative: 0 %
EOS ABS: 0 10*3/uL (ref 0.0–0.5)
EOS PCT: 0 %
HCT: 27 % — ABNORMAL LOW (ref 39.0–52.0)
HEMOGLOBIN: 9.6 g/dL — AB (ref 13.0–17.0)
Immature Granulocytes: 8 %
LYMPHS ABS: 0 10*3/uL — AB (ref 0.7–4.0)
Lymphocytes Relative: 1 %
MCH: 30.2 pg (ref 26.0–34.0)
MCHC: 35.6 g/dL (ref 30.0–36.0)
MCV: 84.9 fL (ref 80.0–100.0)
Monocytes Absolute: 0.4 10*3/uL (ref 0.1–1.0)
Monocytes Relative: 15 %
NRBC: 0 % (ref 0.0–0.2)
Neutro Abs: 2.1 10*3/uL (ref 1.7–7.7)
Neutrophils Relative %: 76 %
Platelets: 39 10*3/uL — ABNORMAL LOW (ref 150–400)
RBC: 3.18 MIL/uL — AB (ref 4.22–5.81)
RDW: 16.7 % — ABNORMAL HIGH (ref 11.5–15.5)
WBC: 2.8 10*3/uL — ABNORMAL LOW (ref 4.0–10.5)

## 2018-02-18 LAB — BASIC METABOLIC PANEL
ANION GAP: 11 (ref 5–15)
Anion gap: 15 (ref 5–15)
BUN: 74 mg/dL — ABNORMAL HIGH (ref 8–23)
BUN: 81 mg/dL — ABNORMAL HIGH (ref 8–23)
CALCIUM: 8 mg/dL — AB (ref 8.9–10.3)
CALCIUM: 8.3 mg/dL — AB (ref 8.9–10.3)
CO2: 20 mmol/L — ABNORMAL LOW (ref 22–32)
CO2: 23 mmol/L (ref 22–32)
CREATININE: 3.25 mg/dL — AB (ref 0.61–1.24)
Chloride: 98 mmol/L (ref 98–111)
Chloride: 101 mmol/L (ref 98–111)
Creatinine, Ser: 3.24 mg/dL — ABNORMAL HIGH (ref 0.61–1.24)
GFR calc Af Amer: 21 mL/min — ABNORMAL LOW (ref >60)
GFR calc non Af Amer: 18 mL/min — ABNORMAL LOW (ref >60)
GFR calc non Af Amer: 18 mL/min — ABNORMAL LOW (ref >60)
GFR, EST AFRICAN AMERICAN: 21 mL/min — AB (ref >60)
Glucose, Bld: 151 mg/dL — ABNORMAL HIGH (ref 70–99)
Glucose, Bld: 141 mg/dL — ABNORMAL HIGH (ref 70–99)
POTASSIUM: 3.4 mmol/L — AB (ref 3.5–5.1)
Potassium: 2.9 mmol/L — ABNORMAL LOW (ref 3.5–5.1)
SODIUM: 133 mmol/L — AB (ref 135–145)
Sodium: 135 mmol/L (ref 135–145)

## 2018-02-18 LAB — ECHOCARDIOGRAM COMPLETE
HEIGHTINCHES: 72 in
WEIGHTICAEL: 3403.9 [oz_av]

## 2018-02-18 LAB — BRAIN NATRIURETIC PEPTIDE: B Natriuretic Peptide: 199 pg/mL — ABNORMAL HIGH (ref 0.0–100.0)

## 2018-02-18 LAB — GLUCOSE, CAPILLARY
GLUCOSE-CAPILLARY: 143 mg/dL — AB (ref 70–99)
GLUCOSE-CAPILLARY: 137 mg/dL — AB (ref 70–99)
Glucose-Capillary: 126 mg/dL — ABNORMAL HIGH (ref 70–99)
Glucose-Capillary: 138 mg/dL — ABNORMAL HIGH (ref 70–99)
Glucose-Capillary: 131 mg/dL — ABNORMAL HIGH (ref 70–99)

## 2018-02-18 LAB — MAGNESIUM: Magnesium: 2.1 mg/dL (ref 1.7–2.4)

## 2018-02-18 MED ORDER — LIDOCAINE-EPINEPHRINE (PF) 1 %-1:200000 IJ SOLN
INTRAMUSCULAR | Status: AC
  Filled 2018-02-18: qty 30

## 2018-02-18 MED ORDER — POTASSIUM CHLORIDE 10 MEQ/50ML IV SOLN
10.0000 meq | INTRAVENOUS | Status: AC
  Administered 2018-02-18 (×3): 10 meq via INTRAVENOUS
  Filled 2018-02-18 (×2): qty 50

## 2018-02-18 MED ORDER — LEVALBUTEROL HCL 0.63 MG/3ML IN NEBU
0.6300 mg | INHALATION_SOLUTION | Freq: Four times a day (QID) | RESPIRATORY_TRACT | Status: DC
  Administered 2018-02-18 – 2018-02-25 (×29): 0.63 mg via RESPIRATORY_TRACT
  Filled 2018-02-18 (×30): qty 3

## 2018-02-18 MED ORDER — FUROSEMIDE 10 MG/ML IJ SOLN
40.0000 mg | Freq: Four times a day (QID) | INTRAMUSCULAR | Status: AC
  Administered 2018-02-18: 40 mg via INTRAVENOUS
  Filled 2018-02-18: qty 4

## 2018-02-18 MED ORDER — ONDANSETRON HCL 4 MG/2ML IJ SOLN
4.0000 mg | Freq: Four times a day (QID) | INTRAMUSCULAR | Status: DC | PRN
  Administered 2018-02-18: 4 mg via INTRAVENOUS
  Filled 2018-02-18: qty 2

## 2018-02-18 MED ORDER — METOPROLOL TARTRATE 5 MG/5ML IV SOLN
2.5000 mg | Freq: Once | INTRAVENOUS | Status: AC
  Administered 2018-02-18: 2.5 mg via INTRAVENOUS

## 2018-02-18 MED ORDER — FAMOTIDINE 20 MG PO TABS
20.0000 mg | ORAL_TABLET | Freq: Every evening | ORAL | Status: DC
  Administered 2018-02-18 – 2018-02-19 (×2): 20 mg via ORAL
  Filled 2018-02-18 (×2): qty 1

## 2018-02-18 MED ORDER — AMIODARONE LOAD VIA INFUSION
150.0000 mg | Freq: Once | INTRAVENOUS | Status: AC
  Administered 2018-02-18: 150 mg via INTRAVENOUS

## 2018-02-18 MED ORDER — SENNA 8.6 MG PO TABS
1.0000 | ORAL_TABLET | Freq: Every day | ORAL | Status: DC | PRN
  Filled 2018-02-18: qty 1

## 2018-02-18 MED ORDER — LIDOCAINE HCL 1 % IJ SOLN
INTRAMUSCULAR | Status: AC
  Filled 2018-02-18: qty 20

## 2018-02-18 MED ORDER — LIDOCAINE-EPINEPHRINE 1 %-1:100000 IJ SOLN
INTRAMUSCULAR | Status: DC | PRN
  Administered 2018-02-18: 10 mL

## 2018-02-18 MED ORDER — DOCUSATE SODIUM 100 MG PO CAPS
100.0000 mg | ORAL_CAPSULE | Freq: Every day | ORAL | Status: DC
  Administered 2018-02-18 – 2018-02-19 (×2): 100 mg via ORAL
  Filled 2018-02-18 (×3): qty 1

## 2018-02-18 MED ORDER — AMIODARONE IV BOLUS ONLY 150 MG/100ML
150.0000 mg | Freq: Once | INTRAVENOUS | Status: AC
  Administered 2018-02-18: 150 mg via INTRAVENOUS
  Filled 2018-02-18: qty 100

## 2018-02-18 MED ORDER — POTASSIUM CHLORIDE CRYS ER 20 MEQ PO TBCR
40.0000 meq | EXTENDED_RELEASE_TABLET | Freq: Once | ORAL | Status: AC
  Administered 2018-02-18: 40 meq via ORAL
  Filled 2018-02-18: qty 2

## 2018-02-18 MED ORDER — METOPROLOL TARTRATE 5 MG/5ML IV SOLN
INTRAVENOUS | Status: AC
  Filled 2018-02-18: qty 5

## 2018-02-18 MED ORDER — DILTIAZEM HCL 25 MG/5ML IV SOLN
2.5000 mg | Freq: Once | INTRAVENOUS | Status: AC
  Administered 2018-02-18: 2.5 mg via INTRAVENOUS
  Filled 2018-02-18: qty 5

## 2018-02-18 MED ORDER — METOPROLOL TARTRATE 5 MG/5ML IV SOLN
2.5000 mg | Freq: Four times a day (QID) | INTRAVENOUS | Status: DC
  Administered 2018-02-18: 2.5 mg via INTRAVENOUS
  Filled 2018-02-18: qty 5

## 2018-02-18 MED ORDER — IPRATROPIUM BROMIDE 0.02 % IN SOLN
0.5000 mg | Freq: Four times a day (QID) | RESPIRATORY_TRACT | Status: DC
  Administered 2018-02-18 – 2018-02-25 (×29): 0.5 mg via RESPIRATORY_TRACT
  Filled 2018-02-18 (×30): qty 2.5

## 2018-02-18 MED FILL — Fentanyl Citrate Preservative Free (PF) Inj 100 MCG/2ML: INTRAMUSCULAR | Qty: 2 | Status: AC

## 2018-02-18 NOTE — Progress Notes (Signed)
Amiodarone 150 mg iv bolus given per Dr Theodosia Blender orders

## 2018-02-18 NOTE — Progress Notes (Signed)
Chris Lopez for Infectious Disease  Date of Admission:  02/21/2018   Total days of antibiotics 3        Day 1: Vanc, Zosyn        Day 3: Vanc, Zosyn D/c, Started Dapto   Active Problems:   Sepsis ([place])   Severe sepsis ([place])   Septic shock ([place])   AKI (acute kidney injury) ([place])   Hyponatremia   Acute respiratory failure ([place])   Acute renal failure ([place])   Bacteremia   Atrial fibrillation with rapid ventricular response (HCC)   Elevated troponin   . gabapentin  300 mg Oral Daily  . insulin aspart  0-9 Units Subcutaneous Q4H  . ipratropium-albuterol  3 mL Nebulization Q6H  . mouth rinse  15 mL Mouth Rinse BID    SUBJECTIVE: Chris Lopez was examined and evaluated at bedside this AM with wife present. He states he feels better and he was observed saturating well off BiPAP resting comfortably in bed with non-breather mask. He was able to tolerate some food yesterday without vomiting. He had some nausea overnight and this morning but states Zofran is helping. He complains of dry mouth because of NPO status. States he is looking forward to getting out of the hospital. Explained the need to take out port and getting echo to evaluate for endocarditis and Chris Lopez expressed understanding. Denies any chest pain, palpitations, fever, chills, abdominal pain.   Review of Systems: Review of Systems  Constitutional: Negative for fever and malaise/fatigue.  Respiratory: Negative for cough and sputum production.   Cardiovascular: Negative for chest pain and palpitations.  Gastrointestinal: Positive for diarrhea (unchanged from prior. 1 episode of watery stool) and nausea. Negative for abdominal pain, constipation and vomiting.  Genitourinary: Negative for dysuria, frequency and urgency.  Neurological: Negative for headaches.    Past Medical History:  Diagnosis Date  . Anxiety   . Arthritis   . BPH (benign prostatic hyperplasia)   . Chronic bronchitis   . COPD (chronic  obstructive pulmonary disease) ([place])   . Depression   . Diabetes mellitus without complication ([place])    Resolved; not needing medications at this time.  Marland Kitchen GERD (gastroesophageal reflux disease)   . Hypertension   . Lymphoma, mantle cell (HCC)     Social History   Tobacco Use  . Smoking status: Current Every Day Smoker    Packs/day: 1.25    Years: 50.00    Pack years: 62.50    Types: Cigarettes  . Smokeless tobacco: Never Used  Substance Use Topics  . Alcohol use: No  . Drug use: No    Family History  Problem Relation Age of Onset  . Colon cancer Maternal Grandmother   . Colon cancer Paternal Grandmother   . Heart disease Mother   . Arthritis Mother   . Prostate cancer Father   . Heart attack Brother   . Heart disease Maternal Aunt   . Cancer Maternal Aunt   . Heart disease Maternal Uncle   . Cancer Maternal Uncle   . Heart disease Paternal Aunt   . Heart disease Paternal Uncle   . Heart disease Brother   . Heart attack Brother   . Arthritis Brother    Allergies  Allergen Reactions  . Bee Venom Anaphylaxis    Allergic to bee stings not the kit  . Feldene [Piroxicam] Rash and Dermatitis    OBJECTIVE: Vitals:   02/18/18 0600 02/18/18 0700 02/18/18 0757 02/18/18 0841  BP: 116/76 (!) 116/95    Pulse: (!) 122 (!) 117    Resp: 20 (!) 22    Temp:   98.8 F (37.1 C)   TempSrc:   Axillary   SpO2: 98% 98%  96%  Weight:      Height:       Body mass index is 28.85 kg/m.  Physical Exam  Constitutional: He is oriented to person, place, and time. He appears well-developed and well-nourished. No distress.  Appear comfortable on non-rebreather  HENT:  Head: Normocephalic and atraumatic.  Mouth/Throat: Oropharynx is clear and moist. No oropharyngeal exudate.  Eyes: Pupils are equal, round, and reactive to light. Conjunctivae and EOM are normal.  Neck: Normal range of motion. Neck supple.  Cardiovascular: Regular rhythm and intact distal pulses.  No murmur  heard. Tachycardic  Pulmonary/Chest: Effort normal. He has no wheezes. He has rales (bilateral lower lobe crackles).  Abdominal: Soft. Bowel sounds are normal. He exhibits no distension. There is no tenderness. There is no guarding.  Musculoskeletal: Normal range of motion. He exhibits no edema.  Neurological: He is alert and oriented to person, place, and time.    Lab Results Lab Results  Component Value Date   WBC 2.8 (L) 02/18/2018   HGB 9.6 (L) 02/18/2018   HCT 27.0 (L) 02/18/2018   MCV 84.9 02/18/2018   PLT 39 (L) 02/18/2018    Lab Results  Component Value Date   CREATININE 3.25 (H) 02/18/2018   BUN 74 (H) 02/18/2018   NA 133 (L) 02/18/2018   K 2.9 (L) 02/18/2018   CL 98 02/18/2018   CO2 20 (L) 02/18/2018    Lab Results  Component Value Date   ALT 24 02/12/2018   AST 40 03/02/2018   ALKPHOS 71 02/28/2018   BILITOT 1.2 02/14/2018     Microbiology: Recent Results (from the past 240 hour(s))  Culture, blood (Routine x 2)     Status: Abnormal (Preliminary result)   Collection Time: 02/11/2018 12:51 PM  Result Value Ref Range Status   Specimen Description BLOOD LEFT ANTECUBITAL  Final   Special Requests   Final    BOTTLES DRAWN AEROBIC AND ANAEROBIC Blood Culture adequate volume   Culture  Setup Time   Final    GRAM POSITIVE COCCI IN BOTH AEROBIC AND ANAEROBIC BOTTLES Gram Stain Report Called to,Read Back By and Verified With: BROWN,KYLIE (MOSESCONE) @0826  BY MATTHEWS, B 11.10.19 [facility] CRITICAL RESULT CALLED TO, READ BACK BY AND VERIFIED WITH: PHARMD RACHEL R [id number] FCP Performed at [facility], [place] [address] [place], [place] [address]    Culture METHICILLIN RESISTANT STAPHYLOCOCCUS AUREUS (A)  Final   Report Status PENDING  Incomplete   Organism ID, Bacteria METHICILLIN RESISTANT STAPHYLOCOCCUS AUREUS  Final      Susceptibility   Methicillin resistant staphylococcus aureus - MIC*    CIPROFLOXACIN >=8 RESISTANT Resistant     ERYTHROMYCIN  >=8 RESISTANT Resistant     GENTAMICIN <=0.5 SENSITIVE Sensitive     OXACILLIN >=4 RESISTANT Resistant     TETRACYCLINE <=1 SENSITIVE Sensitive     VANCOMYCIN <=0.5 SENSITIVE Sensitive     TRIMETH/SULFA <=10 SENSITIVE Sensitive     CLINDAMYCIN <=0.25 SENSITIVE Sensitive     RIFAMPIN <=0.5 SENSITIVE Sensitive     Inducible Clindamycin NEGATIVE Sensitive     * METHICILLIN RESISTANT STAPHYLOCOCCUS AUREUS  Blood Culture ID Panel (Reflexed)     Status: Abnormal   Collection Time: 02/16/2018 12:51 PM  Result Value  Ref Range Status   Enterococcus species NOT DETECTED NOT DETECTED Final   Listeria monocytogenes NOT DETECTED NOT DETECTED Final   Staphylococcus species DETECTED (A) NOT DETECTED Final    Comment: CRITICAL RESULT CALLED TO, READ BACK BY AND VERIFIED WITH: PHARMD RACHEL R 1454 [id number] FCP    Staphylococcus aureus (BCID) DETECTED (A) NOT DETECTED Final    Comment: Methicillin (oxacillin)-resistant Staphylococcus aureus (MRSA). MRSA is predictably resistant to beta-lactam antibiotics (except ceftaroline). Preferred therapy is vancomycin unless clinically contraindicated. Patient requires contact precautions if  hospitalized. CRITICAL RESULT CALLED TO, READ BACK BY AND VERIFIED WITH: PHARMD RACHEL R 1454 [id number] FCP    Methicillin resistance DETECTED (A) NOT DETECTED Final    Comment: CRITICAL RESULT CALLED TO, READ BACK BY AND VERIFIED WITH: PHARMD RACHEL R 1454 [id number] FCP    Streptococcus species NOT DETECTED NOT DETECTED Final   Streptococcus agalactiae NOT DETECTED NOT DETECTED Final   Streptococcus pneumoniae NOT DETECTED NOT DETECTED Final   Streptococcus pyogenes NOT DETECTED NOT DETECTED Final   Acinetobacter baumannii NOT DETECTED NOT DETECTED Final   Enterobacteriaceae species NOT DETECTED NOT DETECTED Final   Enterobacter cloacae complex NOT DETECTED NOT DETECTED Final   Escherichia coli NOT DETECTED NOT DETECTED Final   Klebsiella oxytoca NOT DETECTED NOT DETECTED Final    Klebsiella pneumoniae NOT DETECTED NOT DETECTED Final   Proteus species NOT DETECTED NOT DETECTED Final   Serratia marcescens NOT DETECTED NOT DETECTED Final   Haemophilus influenzae NOT DETECTED NOT DETECTED Final   Neisseria meningitidis NOT DETECTED NOT DETECTED Final   Pseudomonas aeruginosa NOT DETECTED NOT DETECTED Final   Candida albicans NOT DETECTED NOT DETECTED Final   Candida glabrata NOT DETECTED NOT DETECTED Final   Candida krusei NOT DETECTED NOT DETECTED Final   Candida parapsilosis NOT DETECTED NOT DETECTED Final   Candida tropicalis NOT DETECTED NOT DETECTED Final    Comment: Performed at [facility], [place]. [address] [place], [place] [address]  Culture, blood (Routine x 2)     Status: None (Preliminary result)   Collection Time: 02/14/2018  1:08 PM  Result Value Ref Range Status   Specimen Description BLOOD LEFT HAND BOTTLES DRAWN AEROBIC ONLY  Final   Special Requests   Final    Blood Culture results may not be optimal due to an inadequate volume of blood received in culture bottles   Culture   Final    NO GROWTH 3 DAYS Performed at [facility], [address], [place], [place] [address]    Report Status PENDING  Incomplete  Urine Culture     Status: None   Collection Time: 02/16/18 11:24 AM  Result Value Ref Range Status   Specimen Description URINE, RANDOM  Final   Special Requests NONE  Final   Culture   Final    NO GROWTH Performed at [facility], [place] [address], [place], [place] [address]    Report Status 02/17/2018 FINAL  Final  Culture, blood (Routine X 2) w Reflex to ID Panel     Status: None (Preliminary result)   Collection Time: 02/16/18 11:35 AM  Result Value Ref Range Status   Specimen Description BLOOD RIGHT HAND  Final   Special Requests   Final    BOTTLES DRAWN AEROBIC ONLY Blood Culture adequate volume   Culture   Final    NO GROWTH < 24 HOURS Performed at [facility], [place] [address], [place], [place] [address]  Report Status PENDING  Incomplete  Culture, blood (Routine X 2) w Reflex to ID Panel     Status: None (Preliminary result)   Collection Time: 02/16/18 11:40 AM  Result Value Ref Range Status   Specimen Description BLOOD RIGHT HAND  Final   Special Requests   Final    BOTTLES DRAWN AEROBIC ONLY Blood Culture adequate volume   Culture   Final    NO GROWTH < 24 HOURS Performed at [facility], [address], [place], [place] [address]    Report Status PENDING  Incomplete  Respiratory Panel by PCR     Status: None   Collection Time: 02/16/18 11:46 AM  Result Value Ref Range Status   Adenovirus NOT DETECTED NOT DETECTED Final   Coronavirus 229E NOT DETECTED NOT DETECTED Final   Coronavirus HKU1 NOT DETECTED NOT DETECTED Final   Coronavirus NL63 NOT DETECTED NOT DETECTED Final   Coronavirus OC43 NOT DETECTED NOT DETECTED Final   Metapneumovirus NOT DETECTED NOT DETECTED Final   Rhinovirus / Enterovirus NOT DETECTED NOT DETECTED Final   Influenza A NOT DETECTED NOT DETECTED Final   Influenza B NOT DETECTED NOT DETECTED Final   Parainfluenza Virus 1 NOT DETECTED NOT DETECTED Final   Parainfluenza Virus 2 NOT DETECTED NOT DETECTED Final   Parainfluenza Virus 3 NOT DETECTED NOT DETECTED Final   Parainfluenza Virus 4 NOT DETECTED NOT DETECTED Final   Respiratory Syncytial Virus NOT DETECTED NOT DETECTED Final   Bordetella pertussis NOT DETECTED NOT DETECTED Final   Chlamydophila pneumoniae NOT DETECTED NOT DETECTED Final   Mycoplasma pneumoniae NOT DETECTED NOT DETECTED Final    Comment: Performed at [facility], [place] [address] [place], [place] [address]  MRSA PCR Screening     Status: None   Collection Time: 02/17/18 10:56 AM  Result Value Ref Range Status   MRSA by PCR NEGATIVE NEGATIVE Final    Comment:        The GeneXpert MRSA Assay (FDA approved for NASAL specimens only), is one component of a comprehensive MRSA colonization surveillance program. It is  not intended to diagnose MRSA infection nor to guide or monitor treatment for MRSA infections. Performed at [facility], [place] [address] [place], [place] [address]      ASSESSMENT: Chris Lopez is a 72yo M w/ Stage IVb mantle cell lymphoma on chemo admit for MRSA bacteremia. His follow up cultures have been negative so far and he has had no further fevers. His AMS appear to be completely resolved and he is hoping to be discharged soon. Recommend completely sepsis work up with echo to help determine duration of abx therapy. He is planned for getting his chemo port removed today.  PLAN: 1. MRSA bacteremia - F/u TTE results - F/u blood culture results - Chemo port removal by IR scheduled for today   Mosetta Anis, MD, PGY1 02/18/2018, 9:35 AM

## 2018-02-18 NOTE — Progress Notes (Signed)
  Echocardiogram 2D Echocardiogram has been performed.  Bobbye Charleston 02/18/2018, 5:00 PM

## 2018-02-18 NOTE — Psychosocial Assessment (Signed)
Dr Radford Pax called with patients continued increased heart rate 160's despite earlier amiodarone bolus. Orders received for another 150 mg IV amiodarone bolus.

## 2018-02-18 NOTE — Progress Notes (Signed)
Progress Note  Patient Name: Chris Lopez Date of Encounter: 02/18/2018  Primary Cardiologist: No primary care provider on file.   Subjective   Currently on 100% nonrebreather but feeling better this morning.  Converted to sinus tachycardia on IV amio.  O2 sats currently 98% on 15 L.  BP stable at 101/74 mmHg.  Denies any chest pain and shortness of breath is improved.  Inpatient Medications    Scheduled Meds: . gabapentin  300 mg Oral Daily  . insulin aspart  0-9 Units Subcutaneous Q4H  . ipratropium-albuterol  3 mL Nebulization Q6H  . mouth rinse  15 mL Mouth Rinse BID   Continuous Infusions: . sodium chloride 250 mL (02/18/18 0800)  . sodium chloride Stopped (02/18/18 0000)  . amiodarone 30 mg/hr (02/18/18 0700)  . DAPTOmycin (CUBICIN)  IV Stopped (02/17/18 2151)  . famotidine (PEPCID) IV 20 mg (02/17/18 2006)  . norepinephrine (LEVOPHED) Adult infusion     PRN Meds: acetaminophen, ondansetron (ZOFRAN) IV, oxyCODONE-acetaminophen **AND** oxyCODONE   Vital Signs    Vitals:   02/18/18 0700 02/18/18 0757 02/18/18 0841 02/18/18 0900  BP: (!) 116/95   101/74  Pulse: (!) 117   (!) 114  Resp: (!) 22   (!) 22  Temp:  98.8 F (37.1 C)    TempSrc:  Axillary    SpO2: 98%  96% 98%  Weight:      Height:        Intake/Output Summary (Last 24 hours) at 02/18/2018 1041 Last data filed at 02/18/2018 0800 Gross per 24 hour  Intake 2100.97 ml  Output 3500 ml  Net -1399.03 ml   Filed Weights   02/16/18 0500 02/17/18 0500 02/18/18 0436  Weight: 101.8 kg 100.7 kg 96.5 kg    Telemetry    Sinus tachycardia at 111 bpm- Personally Reviewed  ECG    No new EKG to review- Personally Reviewed  Physical Exam   GEN: No acute distress.   Neck: No JVD Cardiac:  Regular but tachycardic, no murmurs, rubs, or gallops.  Respiratory: Clear to auscultation bilaterally. GI: Soft, nontender, non-distended  MS: No edema; No deformity. Neuro:  Nonfocal  Psych: Normal affect    Labs    Chemistry Recent Labs  Lab 02/14/2018 1253  02/17/18 1056 02/17/18 2029 02/18/18 0443  NA 124*   < > 132* 133* 133*  K 4.2   < > 3.1* 3.0* 2.9*  CL 92*   < > 98 98 98  CO2 20*   < > 20* 21* 20*  GLUCOSE 90   < > 175* 163* 151*  BUN 55*   < > 64* 70* 74*  CREATININE 3.56*   < > 3.07* 3.20* 3.25*  CALCIUM 7.2*   < > 7.5* 8.0* 8.0*  PROT 5.4*  --   --   --   --   ALBUMIN 2.6*  --   --   --   --   AST 40  --   --   --   --   ALT 24  --   --   --   --   ALKPHOS 71  --   --   --   --   BILITOT 1.2  --   --   --   --   GFRNONAA 16*   < > 19* 18* 18*  GFRAA 18*   < > 22* 21* 21*  ANIONGAP 12   < > 14 14 15    < > = values  in this interval not displayed.     Hematology Recent Labs  Lab 02/16/18 0504 02/17/18 0142 02/18/18 0443  WBC 1.2* 1.7* 2.8*  RBC 2.89* 2.88* 3.18*  HGB 8.9* 8.7* 9.6*  HCT 25.8* 25.3* 27.0*  MCV 89.3 87.8 84.9  MCH 30.8 30.2 30.2  MCHC 34.5 34.4 35.6  RDW 16.7* 16.8* 16.7*  PLT 51* 44* 39*    Cardiac Enzymes Recent Labs  Lab 02/16/18 1123 02/16/18 1834 02/17/18 0142  TROPONINI 0.25* 0.24* 0.24*   No results for input(s): TROPIPOC in the last 168 hours.   BNPNo results for input(s): BNP, PROBNP in the last 168 hours.   DDimer No results for input(s): DDIMER in the last 168 hours.   Radiology    US Renal  Result Date: 02/16/2018 CLINICAL DATA:  Acute renal failure. EXAM: RENAL / URINARY TRACT ULTRASOUND COMPLETE COMPARISON:  None. FINDINGS: Right Kidney: Renal measurements: 12.9 x 6.7 x 6.5 cm = volume: 291 mL . Echogenicity within normal limits. No mass or hydronephrosis visualized. Left Kidney: Renal measurements: 12.6 x 6.5 x 6.4 cm = volume: 272 mL. 6.3 cm simple cyst is seen arising from lower pole. Echogenicity within normal limits. No mass or hydronephrosis visualized. Bladder: Urinary bladder is decompressed secondary to Foley catheter. Incidental note is made of moderate splenomegaly. IMPRESSION: Simple left renal cyst.  No  other renal abnormality seen. Incidental note is made of moderate splenomegaly. Electronically Signed   By: Marijo Conception, M.D.   On: 02/16/2018 16:56   Dg Chest Port 1 View  Result Date: 02/17/2018 CLINICAL DATA:  Acute respiratory failure EXAM: PORTABLE CHEST 1 VIEW COMPARISON:  Yesterday FINDINGS: Right-sided port catheter with tip at the upper right atrium. Low volumes with generalized interstitial coarsening. Asymmetric opacity in the right mid lower lung slight perihilar improvement from before. No effusion or pneumothorax.  Normal heart size. IMPRESSION: 1. Low volumes with bilateral interstitial coarsening favoring edema. 2. Asymmetric hazy lung opacity which may be atelectasis or pneumonia. Electronically Signed   By: Monte Fantasia M.D.   On: 02/17/2018 07:08   Dg Ankle Right Port  Result Date: 02/17/2018 CLINICAL DATA:  Pain with redness and swelling.  No known trauma. EXAM: PORTABLE RIGHT ANKLE - 2 VIEW COMPARISON:  None. FINDINGS: Soft tissue swelling, particularly laterally. Calcification in the plantar fascia. No acute fracture. Lucency over the anterior aspect of the distal tibia on the lateral view only. No other acute abnormalities. IMPRESSION: 1. Soft tissue swelling. 2. No acute fracture. 3. Rounded in the anterior aspect of they tibia on the lateral view only is age indeterminate. Recommend clinical correlation. Electronically Signed   By: Dorise Bullion III M.D   On: 02/17/2018 22:54   Dg Foot 2 Views Right  Result Date: 02/17/2018 CLINICAL DATA:  Pain. EXAM: RIGHT FOOT - 2 VIEW COMPARISON:  None. FINDINGS: There is a defect over the anterior inferior aspect of the distal tibia on the lateral view. No other acute abnormalities. IMPRESSION: Rounded defect over the anterior distal tibia on the lateral view may represent sequela of old trauma. Bony erosion not excluded. Electronically Signed   By: Dorise Bullion III M.D   On: 02/17/2018 22:55    Cardiac Studies    None  Patient Profile     72 y.o. male with Stage 4b mantle cell lymphoma currently on chemo Tx with Bendamustine and Rituximab with subsequent leukopenia who was admitted with body aches, fever, hypotension and dx with MRSA bacteremia and neutropenic septic  shock.  He also has HTN, type 2 DM and COPD.  He was hypoxic on admission and started on 100% nonRB and  hypotensive and given IVF resuscitation and pressors with subseuqnet worsening respiratory status and Cxray showed CHF and started on IV Lasix and BiPAP.  Also started on IV antibx .  Went into afib with RVR with soft BP.  Initially started on IV Amio but felt that this was not having much effect and so this was stopped and he was started on IV Cardizem with resultant hypotension with SBP in the 80's and HR persisting in the 130's in afib.  Currently not on anticoagulation due to severe thrombocytopenia.    Assessment & Plan    1.  Afib with RVR -in NSR on admit and went into afib with RVR  likely related to acute illness -Started on IV amiodarone drip yesterday and converted to sinus tachycardia -Currently sinus tachycardia on telemetry at 111 bpm -repeat echo today -no anticoagulation due to thrombocytopenia -Continue on IV amio drip until patient is to take taking adequate p.o. and off nonrebreather  2.  Severe sepsis with MRSA bacteremia -likely related to Port-A-Cath -contiue IV Vanc and Zosyn per CCM -Port-A-Cath removal planned for today per IR  3.  Acute respiratory failure -related to PNA as well as acute pulmonary edema -He put out 3.67 L yesterday and is net -831 cc. -Creatinine bumped slightly from 3.07>3.25 today. -continue IV lasix and follow renal function closely -Check BNP/cxray today -continue IV antbx  4.  Acute diastolic CHF -last echo showed normal LVF 10/2017. -likely related to volume resuscitation in setting of septic shock and afib with RVR as well as AKI -continue IV diuretics and follows renal  function, daily weights and I&Os closely -2D echo now that back in sinus to reassess LVF  5.   Hypokalemia/Hypomagnesemia -replete per CCM -need to keep K+>4 and Mag > 2.  6.  Acute Kidney injury -related to septic shock -baseline Creatinine 1 but now up to 3.25 -Renal US with simple left cyst and no other abnormalities.  7.  Elevated troponin -trop mildly elevated with flat trend (0.25/0.24/0.24) and normal CPK  -this is consistent with demand ischemia in the setting of septic shock, CHF and afib with RVR -await assessment of LVF with echo      For questions or updates, please contact [facility] Please consult [email or web address] for contact info under Cardiology/STEMI.      Signed, Fransico Him, MD  02/18/2018, 10:41 AM

## 2018-02-18 NOTE — Progress Notes (Signed)
Called by RN that patient's HR sustaining in 160s-170s despite recent amiodarone bolus.   Suspect that his ongoing AF with RVR is related to systemic illness and ongoing sepsis. Will likely be unable to achieve adequate rate control until other active respiratory and infectious issues more quiescent. Nonetheless, will trial add low dose IV diltiazem (2.5 mg IV x 1) in lieu of midnight dose of metoprolol to assess for effect. Review TTE and LVEF preserved at 55-60%.   Patient's BP 100s/70s currently.   Mada Sadik K. Marletta Lor, MD

## 2018-02-18 NOTE — Progress Notes (Signed)
[facility] Coordinator will follow with ID to support [facility] services at [place] if needed.  If patient discharges after hours, please call [phone].   Chris Lopez 02/18/2018, 8:36 AM

## 2018-02-18 NOTE — Progress Notes (Signed)
RT NOTE: RT placed patient on 10L HFNC [place] per MD order. Patient is tolerating well currently sating 94%. RT will continue to monitor.

## 2018-02-18 NOTE — Progress Notes (Signed)
DR Radford Pax and Dr [place] Bells notified of continued heart rate 160 orders received for 2.5 mg IV metoprolol given

## 2018-02-18 NOTE — Progress Notes (Signed)
Paged Dr Radford Pax for increased heart rate due to no response from earlier text

## 2018-02-18 NOTE — Progress Notes (Signed)
Dr Radford Pax again notified for continued increased heart rate 150's orders received for 2.5 mg IV metoprolol and given

## 2018-02-18 NOTE — Progress Notes (Signed)
Preliminary notes--Right lower extremity venous duplex exam completed. Negative for DVT.  Kabrina Christiano H Satonya Lux(RDMS RVT) 02/18/18 2:58 PM

## 2018-02-18 NOTE — Progress Notes (Signed)
NAME:  Chris Lopez, MRN:  [id number], DOB:  09/23/1945, LOS: 0 ADMISSION DATE:  02/21/2018, CONSULTATION DATE: 02/28/2018 REFERRING MD: [facility], CHIEF COMPLAINT: Body aches  Brief History   Chris Lopez 72 year old male with history of mantle cell lymphoma undergoing chemotherapy coming in with generalized body aches and hypotension, sepsis with GPC bacteremia  Past Medical History  Mantle cell lymphoma, Anxiety, Arthritis, BPH (benign prostatic hyperplasia), Chronic bronchitis, COPD (chronic obstructive pulmonary disease) ([place]), Depression, Diabetes mellitus without complication ([place]), GERD (gastroesophageal reflux disease), Hypertension,  Significant Hospital Events   11/9 > admit from [facility] ED  Consults: date of consult/date signed off & final recs:    Procedures (surgical and bedside):  Rt chemo port >>  Significant Diagnostic Tests:    Micro Data:  Blood culture APH 11/9 >> 1/2 GPC  Antimicrobials:  11/9 Zosyn>  11/9 Vancomycin>  Subjective:  Says he feels better Remains profoundly hypoxemic Had more foot swelling and pain last night Back in sinus rhythm with more amiodarone overnight  Objective   Blood pressure 101/74, pulse (!) 114, temperature 99.3 F (37.4 C), temperature source Axillary, resp. rate (!) 22, height 6' (1.829 m), weight 96.5 kg, SpO2 98 %.    Vent Mode: BIPAP FiO2 (%):  [60 %-100 %] 100 % PEEP:  [6 cmH20] 6 cmH20   Intake/Output Summary (Last 24 hours) at 02/18/2018 1125 Last data filed at 02/18/2018 0800 Gross per 24 hour  Intake 2084.93 ml  Output 3500 ml  Net -1415.07 ml   Filed Weights   02/16/18 0500 02/17/18 0500 02/18/18 0436  Weight: 101.8 kg 100.7 kg 96.5 kg    Examination:  General:  Resting comfortably in bed HENT: NCAT OP clear PULM: Few crackles bases B, normal effort CV: Tachy, regular, no mgr GI: BS+, soft, nontender MSK: normal bulk and tone Derm: some faint redness over lateral aspect of dorsum  of R foot Neuro: awake, alert, no distress, MAEW     Resolved Hospital Problem list     Assessment & Plan:  72 year old with mantle cell lymphoma on chemotherapy admitted with neutropenia, septic shock, gram-positive bacteremia, skin changes R foot concerning for cellulitis, also with atrial fibrillation with RVR.  Acute respiratory failure with hypoxemia due to acute pulmonary edema >lasix again today >BIPAP prn >titrate O2 to maintain O2 saturation > 90% > if no improvement despite lasix may need CT chest and empiric steroids  MRSA bacteremia, neutropenia, port in place >remove port >dapto per ID >f/u repeat blood cultures  Atrial fibrillation with RVR > cardiology appreciated > hold heparin with thrombocytopenia > continue amiodarone  Acute pulmonary edema > echo pending  AKI Monitor BMET and UOP Replace electrolytes as needed Lasix again today, monitor renal function closely  Hypokalemia Replace K again  Pancytopenia, neutropenia secondary to chemotherapy for mantle cell lymphoma Monitor CBC  COPD duoneb qid  Home gabapentin dependence Continue low dose  CODE STATUS: DNR  Disposition / Summary of Today's Plan 02/18/18   Read above    Diet: advance diet Pain/Anxiety/Delirium protocol (if indicated): NA VAP protocol (if indicated): NA DVT prophylaxis: SCDs.  GI prophylaxis: NA Hyperglycemia protocol: NA Mobility: bedrest Code Status: DNR Family Communication: wife updated bedside by me 11/11  The patient is critically ill with multiple organ system failure and requires high complexity decision making for assessment and support, frequent evaluation and titration of therapies, advanced monitoring, review of radiographic studies and interpretation of complex data.   Critical Care Time devoted  to patient care services, exclusive of separately billable procedures, described in this note is 35 minutes.   Roselie Awkward, MD [facility] Pager:  [phone] Cell: [phone] After 3pm or if no response, call [phone]  02/18/2018, 11:25 AM

## 2018-02-18 NOTE — Progress Notes (Signed)
[facility] Progress Note Patient Name: Chris Lopez DOB: 10/13/1945 MRN: [id number]   Date of Service  02/18/2018  HPI/Events of Note  K+ = 2.9 and Creatinine = 3.25.   eICU Interventions  Will order: 1. Replace K+. 2. Repeat BMP at 12 noon.      Intervention Category Major Interventions: Electrolyte abnormality - evaluation and management  Sommer,Steven Eugene 02/18/2018, 5:54 AM

## 2018-02-18 NOTE — Procedures (Signed)
  Pre-operative Diagnosis:  Bacteremia with right chest port.        Post-operative Diagnosis: Complete removal of right chest port.  No signs of infection in port pocket.   Indications: Bacteremia with right chest port.   Procedure: Removal of right chest port.  Findings: Removal of right chest port at bedside.  Pocket looked healthy and was irrigated with sterile saline.  Pocket was closed with suture and Dermabond  Complications: None     EBL: Minimal   Plan: IR will sign off.

## 2018-02-18 NOTE — Progress Notes (Signed)
RT NOTE: RT placed patient off bipap on on NRB. Patient tolerating well sating 97%, HR 113, BP 127/77, RR 21. RT will continue to monitor.

## 2018-02-18 NOTE — Progress Notes (Signed)
Dr Radford Pax text paged with continued heart rate 160

## 2018-02-19 ENCOUNTER — Inpatient Hospital Stay (HOSPITAL_COMMUNITY): Payer: Medicare HMO

## 2018-02-19 DIAGNOSIS — Z888 Allergy status to other drugs, medicaments and biological substances status: Secondary | ICD-10-CM

## 2018-02-19 DIAGNOSIS — I5031 Acute diastolic (congestive) heart failure: Secondary | ICD-10-CM

## 2018-02-19 LAB — GLUCOSE, CAPILLARY
GLUCOSE-CAPILLARY: 116 mg/dL — AB (ref 70–99)
GLUCOSE-CAPILLARY: 137 mg/dL — AB (ref 70–99)
GLUCOSE-CAPILLARY: 155 mg/dL — AB (ref 70–99)
Glucose-Capillary: 106 mg/dL — ABNORMAL HIGH (ref 70–99)
Glucose-Capillary: 119 mg/dL — ABNORMAL HIGH (ref 70–99)
Glucose-Capillary: 121 mg/dL — ABNORMAL HIGH (ref 70–99)
Glucose-Capillary: 129 mg/dL — ABNORMAL HIGH (ref 70–99)

## 2018-02-19 LAB — CBC WITH DIFFERENTIAL/PLATELET
ABS IMMATURE GRANULOCYTES: 0.54 10*3/uL — AB (ref 0.00–0.07)
Basophils Absolute: 0 10*3/uL (ref 0.0–0.1)
Basophils Relative: 0 %
Eosinophils Absolute: 0 10*3/uL (ref 0.0–0.5)
Eosinophils Relative: 0 %
HEMATOCRIT: 26.8 % — AB (ref 39.0–52.0)
HEMOGLOBIN: 9.8 g/dL — AB (ref 13.0–17.0)
Immature Granulocytes: 21 %
LYMPHS PCT: 1 %
Lymphs Abs: 0 10*3/uL — ABNORMAL LOW (ref 0.7–4.0)
MCH: 30.4 pg (ref 26.0–34.0)
MCHC: 36.6 g/dL — ABNORMAL HIGH (ref 30.0–36.0)
MCV: 83.2 fL (ref 80.0–100.0)
MONO ABS: 0.5 10*3/uL (ref 0.1–1.0)
MONOS PCT: 19 %
NEUTROS ABS: 1.5 10*3/uL — AB (ref 1.7–7.7)
Neutrophils Relative %: 59 %
Platelets: 40 10*3/uL — ABNORMAL LOW (ref 150–400)
RBC: 3.22 MIL/uL — AB (ref 4.22–5.81)
RDW: 16.8 % — AB (ref 11.5–15.5)
WBC: 2.5 10*3/uL — ABNORMAL LOW (ref 4.0–10.5)
nRBC: 0 % (ref 0.0–0.2)

## 2018-02-19 LAB — CULTURE, BLOOD (ROUTINE X 2): Special Requests: ADEQUATE

## 2018-02-19 LAB — BASIC METABOLIC PANEL
Anion gap: 13 (ref 5–15)
BUN: 102 mg/dL — AB (ref 8–23)
CHLORIDE: 103 mmol/L (ref 98–111)
CO2: 22 mmol/L (ref 22–32)
CREATININE: 3.42 mg/dL — AB (ref 0.61–1.24)
Calcium: 8.4 mg/dL — ABNORMAL LOW (ref 8.9–10.3)
GFR calc Af Amer: 19 mL/min — ABNORMAL LOW (ref >60)
GFR calc non Af Amer: 17 mL/min — ABNORMAL LOW (ref >60)
GLUCOSE: 141 mg/dL — AB (ref 70–99)
POTASSIUM: 3.2 mmol/L — AB (ref 3.5–5.1)
Sodium: 138 mmol/L (ref 135–145)

## 2018-02-19 MED ORDER — POTASSIUM CHLORIDE CRYS ER 20 MEQ PO TBCR
40.0000 meq | EXTENDED_RELEASE_TABLET | Freq: Once | ORAL | Status: AC
  Administered 2018-02-19: 40 meq via ORAL
  Filled 2018-02-19: qty 2

## 2018-02-19 MED ORDER — DILTIAZEM HCL-DEXTROSE 100-5 MG/100ML-% IV SOLN (PREMIX)
5.0000 mg/h | INTRAVENOUS | Status: DC
  Administered 2018-02-19: 5 mg/h via INTRAVENOUS
  Administered 2018-02-19 – 2018-02-25 (×16): 15 mg/h via INTRAVENOUS
  Filled 2018-02-19 (×18): qty 100

## 2018-02-19 MED ORDER — ENSURE ENLIVE PO LIQD
237.0000 mL | Freq: Two times a day (BID) | ORAL | Status: DC
  Administered 2018-02-19 – 2018-02-20 (×2): 237 mL via ORAL

## 2018-02-19 MED ORDER — AMIODARONE LOAD VIA INFUSION
150.0000 mg | Freq: Once | INTRAVENOUS | Status: AC
  Administered 2018-02-19: 150 mg via INTRAVENOUS

## 2018-02-19 NOTE — Progress Notes (Signed)
Progress Note  Patient Name: Chris Lopez Date of Encounter: 02/19/2018  Primary Cardiologist: No primary care provider on file.   Subjective   Remains on 100% nonrebreather and appears more lethargic this morning.  O2 sats 96%.  Remains in atrial fibrillation with RVR throughout the night despite multiple boluses of IV amiodarone as well as Lopressor.  BP soft ranging from 85/62 to 103/72 mmHg.  Inpatient Medications    Scheduled Meds: . docusate sodium  100 mg Oral Daily  . famotidine  20 mg Oral QPM  . gabapentin  300 mg Oral Daily  . insulin aspart  0-9 Units Subcutaneous Q4H  . ipratropium  0.5 mg Nebulization Q6H  . levalbuterol  0.63 mg Nebulization Q6H  . mouth rinse  15 mL Mouth Rinse BID   Continuous Infusions: . sodium chloride Stopped (02/18/18 1650)  . sodium chloride 10 mL/hr at 02/19/18 0900  . amiodarone 30 mg/hr (02/19/18 0900)  . DAPTOmycin (CUBICIN)  IV Stopped (02/17/18 2151)   PRN Meds: acetaminophen, lidocaine-EPINEPHrine, ondansetron (ZOFRAN) IV, oxyCODONE-acetaminophen **AND** oxyCODONE, senna   Vital Signs    Vitals:   02/19/18 0830 02/19/18 0845 02/19/18 0900 02/19/18 0915  BP: 103/72 95/72 93/64  (!) 85/60  Pulse: 70 (!) 168 97 88  Resp: (!) 24 17 18  (!) 23  Temp:      TempSrc:      SpO2: 98% 99% 97% 96%  Weight:      Height:        Intake/Output Summary (Last 24 hours) at 02/19/2018 0945 Last data filed at 02/19/2018 0900 Gross per 24 hour  Intake 1168.06 ml  Output 2300 ml  Net -1131.94 ml   Filed Weights   02/17/18 0500 02/18/18 0436 02/19/18 0200  Weight: 100.7 kg 96.5 kg 94 kg    Telemetry  Atrial fibrillation with RVR 150 bpm- Personally Reviewed  ECG    No new EKG to review- Personally Reviewed  Physical Exam   GEN: Ill-appearing and cachectic HEENT: Normal NECK: No JVD; No carotid bruits LYMPHATICS: No lymphadenopathy CARDIAC: Irregularly irregular and tachycardic, no murmurs, rubs, gallops RESPIRATORY:  Decreased breath sounds anteriorly with faint crackles at the bases ABDOMEN: Soft, non-tender, non-distended MUSCULOSKELETAL:  No edema; No deformity  SKIN: Warm and dry NEUROLOGIC:  Alert and oriented x 3 PSYCHIATRIC:  Normal affect    Labs    Chemistry Recent Labs  Lab 03/04/2018 1253  02/18/18 0443 02/18/18 1140 02/19/18 0441  NA 124*   < > 133* 135 138  K 4.2   < > 2.9* 3.4* 3.2*  CL 92*   < > 98 101 103  CO2 20*   < > 20* 23 22  GLUCOSE 90   < > 151* 141* 141*  BUN 55*   < > 74* 81* 102*  CREATININE 3.56*   < > 3.25* 3.24* 3.42*  CALCIUM 7.2*   < > 8.0* 8.3* 8.4*  PROT 5.4*  --   --   --   --   ALBUMIN 2.6*  --   --   --   --   AST 40  --   --   --   --   ALT 24  --   --   --   --   ALKPHOS 71  --   --   --   --   BILITOT 1.2  --   --   --   --   GFRNONAA 16*   < > 18* 18*  17*  GFRAA 18*   < > 21* 21* 19*  ANIONGAP 12   < > 15 11 13    < > = values in this interval not displayed.     Hematology Recent Labs  Lab 02/17/18 0142 02/18/18 0443 02/19/18 0441  WBC 1.7* 2.8* 2.5*  RBC 2.88* 3.18* 3.22*  HGB 8.7* 9.6* 9.8*  HCT 25.3* 27.0* 26.8*  MCV 87.8 84.9 83.2  MCH 30.2 30.2 30.4  MCHC 34.4 35.6 36.6*  RDW 16.8* 16.7* 16.8*  PLT 44* 39* 40*    Cardiac Enzymes Recent Labs  Lab 02/16/18 1123 02/16/18 1834 02/17/18 0142  TROPONINI 0.25* 0.24* 0.24*   No results for input(s): TROPIPOC in the last 168 hours.   BNP Recent Labs  Lab 02/18/18 0443  BNP 199.0*     DDimer No results for input(s): DDIMER in the last 168 hours.   Radiology    Dg Chest 1 View  Result Date: 02/18/2018 CLINICAL DATA:  SOB. CHF.  Hx of HTN, DM, COPD, chronic bronchitis. EXAM: CHEST  1 VIEW COMPARISON:  02/17/2018 FINDINGS: Port in the anterior chest wall with tip in distal SVC. Anterior cervical fusion. Low lung volumes. LEFT basilar atelectasis similar prior. Fine interstitial pattern similar prior. Bone island in the RIGHT second rib. IMPRESSION: 1. Mild interstitial  edema. 2. LEFT basilar atelectasis. 3. Minimal change from prior. Electronically Signed   By: Suzy Bouchard M.D.   On: 02/18/2018 12:08   Ir Removal [employer] W/ [place] W/o Fl Mod Sed  Result Date: 02/18/2018 INDICATION: 72 year old with history of mantle cell lymphoma. Port-A-Cath was placed on 10/30/2017 by Dr. Kathlene Cote. Patient is currently admitted with bacteremia. Request for Port-A-Cath removal. EXAM: REMOVAL RIGHT IJ VEIN PORT-A-CATH MEDICATIONS: Inpatient receiving antibiotics ANESTHESIA/SEDATION: None FLUOROSCOPY TIME:  None COMPLICATIONS: None immediate. PROCEDURE: Informed consent was obtained for Port-A-Cath removal. All questions were addressed. Maximal Sterile Barrier Technique was utilized including caps, mask, sterile gowns, sterile gloves, sterile drape, hand hygiene and skin antiseptic. A timeout was performed prior to the initiation of the procedure. The right chest was prepped and draped in a sterile fashion. Lidocaine with epinephrine was utilized for local anesthesia. An incision was made over the previously healed surgical incision. Utilizing blunt dissection, the port catheter and reservoir were removed from the underlying subcutaneous tissue in their entirety. The pocket was irrigated with a copious amount of sterile normal saline. The subcutaneous tissue was closed with 3-0 Vicryl interrupted subcutaneous stitches. A 4-0 Vicryl running subcuticular stitch was utilized to approximate the skin. Dermabond was applied. FINDINGS: Healthy-looking subcutaneous pocket. No drainage from the subcutaneous pocket. IMPRESSION: Successful right IJ vein Port-A-Cath explant. Electronically Signed   By: Markus Daft M.D.   On: 02/18/2018 18:22   Dg Ankle Right Port  Result Date: 02/17/2018 CLINICAL DATA:  Pain with redness and swelling.  No known trauma. EXAM: PORTABLE RIGHT ANKLE - 2 VIEW COMPARISON:  None. FINDINGS: Soft tissue swelling, particularly laterally. Calcification in the plantar  fascia. No acute fracture. Lucency over the anterior aspect of the distal tibia on the lateral view only. No other acute abnormalities. IMPRESSION: 1. Soft tissue swelling. 2. No acute fracture. 3. Rounded in the anterior aspect of they tibia on the lateral view only is age indeterminate. Recommend clinical correlation. Electronically Signed   By: Dorise Bullion III M.D   On: 02/17/2018 22:54   Dg Foot 2 Views Right  Result Date: 02/17/2018 CLINICAL DATA:  Pain. EXAM: RIGHT FOOT - 2 VIEW  COMPARISON:  None. FINDINGS: There is a defect over the anterior inferior aspect of the distal tibia on the lateral view. No other acute abnormalities. IMPRESSION: Rounded defect over the anterior distal tibia on the lateral view may represent sequela of old trauma. Bony erosion not excluded. Electronically Signed   By: Dorise Bullion III M.D   On: 02/17/2018 22:55   Vas [place] Lower Extremity Venous (dvt)  Result Date: 02/18/2018  Lower Venous Study Indications: Edema.  Limitations: Body habitus and immobility. Performing Technologist: Lorina Rabon  Examination Guidelines: A complete evaluation includes B-mode imaging, spectral Doppler, color Doppler, and power Doppler as needed of all accessible portions of each vessel. Bilateral testing is considered an integral part of a complete examination. Limited examinations for reoccurring indications may be performed as noted.  Right Venous Findings: +---------+---------------+---------+-----------+----------+-------+          CompressibilityPhasicitySpontaneityPropertiesSummary +---------+---------------+---------+-----------+----------+-------+ CFV      Full           Yes      Yes                          +---------+---------------+---------+-----------+----------+-------+ SFJ      Full                                                 +---------+---------------+---------+-----------+----------+-------+ FV Prox  Full                                                  +---------+---------------+---------+-----------+----------+-------+ FV Mid   Full                                                 +---------+---------------+---------+-----------+----------+-------+ FV DistalFull                                                 +---------+---------------+---------+-----------+----------+-------+ PFV      Full                                                 +---------+---------------+---------+-----------+----------+-------+ POP      Full           Yes      Yes                          +---------+---------------+---------+-----------+----------+-------+ PTV      Full                                                 +---------+---------------+---------+-----------+----------+-------+ PERO     Full                                                 +---------+---------------+---------+-----------+----------+-------+  Left Venous Findings: +---+---------------+---------+-----------+----------+-------+    CompressibilityPhasicitySpontaneityPropertiesSummary +---+---------------+---------+-----------+----------+-------+ CFVFull           Yes      Yes                          +---+---------------+---------+-----------+----------+-------+    Summary: Right: There is no evidence of deep vein thrombosis in the lower extremity. Left: No evidence of common femoral vein obstruction.  *See table(s) above for measurements and observations. Electronically signed by Deitra Mayo MD on 02/18/2018 at 7:32:36 PM.    Final     Cardiac Studies   None  Patient Profile     72 y.o. male with Stage 4b mantle cell lymphoma currently on chemo Tx with Bendamustine and Rituximab with subsequent leukopenia who was admitted with body aches, fever, hypotension and dx with MRSA bacteremia and neutropenic septic shock.  He also has HTN, type 2 DM and COPD.  He was hypoxic on admission and started on 100% nonRB and  hypotensive and given  IVF resuscitation and pressors with subseuqnet worsening respiratory status and Cxray showed CHF and started on IV Lasix and BiPAP.  Also started on IV antibx .  Went into afib with RVR with soft BP.  Initially started on IV Amio but felt that this was not having much effect and so this was stopped and he was started on IV Cardizem with resultant hypotension with SBP in the 80's and HR persisting in the 130's in afib.  Currently not on anticoagulation due to severe thrombocytopenia.    Assessment & Plan    1.  Afib with RVR -in NSR on admit and went into afib with RVR  likely related to acute illness -Started on IV amiodarone drip and converted to sinus tachycardia unfortunately converted back to atrial fibrillation with RVR yesterday. -Heart rate has been very difficult to control despite multiple doses of IV amio and Lopressor. -Suspect that ongoing A. fib with RVR related to systemic illness, metabolic derangements and ongoing sepsis and will be unable to achieve adequate rate control or maintain normal sinus rhythm until his respiratory status has improved. -We will continue to bolus with IV amio.  I instructed the nurse to give him 150 mg bolus now and increase drip to 60 mg/h -Need to be aggressive with potassium repletion and keep potassium greater than 4 -Ask EP to see patient for any further recommendations -Currently BP is too soft to give beta-blocker or calcium channel blocker. -If BP stabilizes would recommend Lopressor IV 2.5 to 5 mg every 6 hours.  Could also use low-dose neo-to help maintain BP while trying to get heart rate better controlled -no anticoagulation due to thrombocytopenia -2D echo showed normal LV function EF 55 to 60%   2.  Severe sepsis with MRSA bacteremia -likely related to Port-A-Cath -contiue IV Vanc and Zosyn per CCM  3.  Acute respiratory failure -related to PNA as well as acute pulmonary edema -Chest x-ray yesterday showed mild interstitial  edema -continue IV antbx -Repeat chest x-ray today  4.  Acute diastolic CHF -last echo showed normal LVF 10/2017. -likely related to volume resuscitation in setting of septic shock and afib with RVR as well as AKI -He put out 2.4-5 L yesterday and is net -1.9 L -Creatinine to needs to climb from 3.25 to 3.42 today. -BNP was only mildly elevated at 199 today -Consider holding or decreasing dose of IV diuretic given worsening renal function. -CVP monitoring would  be helpful to assess volume status further -2D echo with normal LV function  5.   Hypokalemia/Hypomagnesemia -replete per CCM -need to keep K+>4 and Mag > 2.  6.  Acute Kidney injury -related to septic shock -baseline Creatinine 1 but now up to 3.25>3.42 -Renal US with simple left cyst and no other abnormalities.  7.  Elevated troponin -trop mildly elevated with flat trend (0.25/0.24/0.24) and normal CPK  -this is consistent with demand ischemia in the setting of septic shock, CHF and afib with RVR -await assessment of LVF with echo  Not much else to him at this time.  The patient is critically ill and may need to consider palliative care route going forward as he does not seem to be making much headway with improvement of a respiratory standpoint.  The patient is critically ill with multiple organ systems failure and requires high complexity decision making for assessment and support, frequent evaluation and titration of therapies, application of advanced monitoring technologies and extensive interpretation of multiple databases. Critical Care Time devoted to patient care services described in this note independent of APP time is 60 minutes with >50% of time spent in direct patient care.     For questions or updates, please contact [place] Please consult [email or web address] for contact info under Cardiology/STEMI.      Signed, Fransico Him, MD  02/19/2018, 9:45 AM

## 2018-02-19 NOTE — Progress Notes (Signed)
[facility] ADULT ICU REPLACEMENT PROTOCOL FOR AM LAB REPLACEMENT ONLY  The patient does not apply for the [facility] Adult ICU Electrolyte Replacment Protocol based on the criteria listed below:   1. Is GFR >/= 40 ml/min? No.  Patient's GFR today is 17     Abnormal electrolyte(s): K=3.2   If a panic level lab has been reported, has the CCM MD in charge been notified? Yes.  .   Physician:  E Gerrit Halls 02/19/2018 6:23 AM

## 2018-02-19 NOTE — Consult Note (Addendum)
Cardiology Consultation:   Patient ID: Chris Lopez MRN: [id number]; DOB: Aug 08, 1945  Admit date: 03/04/2018 Date of Consult: 02/19/2018  Primary Care Provider: Celene Squibb, MD Primary Cardiologist: Dr. Harrington Challenger (remotely) Primary Electrophysiologist:  None    Patient Profile:   Chris Lopez is a 72 y.o. male with a hx of Mantle cell lymphoma undergoing chemotherapy with Bendamustine and Rituximab , HTN, anxiety/depression, COPD, chronic bronchitis, BPH, DM who is being seen today for the evaluation of difficult to control AFib w/RVR at the request of Dr. Radford Pax.  Patient has DNR status  History of Present Illness:   Chris Lopez was admitted initially at [facility] with Bendamustine and Rituximab with initial complaints of generalized body aches, ASM, reports of fever, he was hypotensive, neutropenic, hypoxic, in septic shock ultimately with gram + bacteremia (MRSA). Started on pressors, fluids, and antibiotics and transferred to [facility] for further care and management.  ID is on his case, managing antibiotics, recommend port removal (done), also mentions concerns for knee replacements (susceptible to MRSA particularly).  His port was removed and to date repeat BC neg (x3 days)  His developed rapid AFib 11/11, this has been difficult to treat with nodal blocking agents with soft BP and placed on amiodarone.  He was in [place] yesterday 110-120's and yesterday afternoon back in rapid AFib at times persistently 170s.  He remains persistently hypoxic despite fluid negative status, requiring BIPAP > NRB today.   BP running 90's for the most part some lower and higher, currently low 100's, AKI persists as well.  He is pancytopenic (through his admission), has been afebrile the last couple days.  Has had edema/erythema R foot described   EP is asked to weigh in on rapid AFib  Today's labs K+ 3.2 BUN/Creat 102/3.42 WBC 2.5 H/H 9.8/26.8 Plts 40  neutrophils 59%, absolute 1.5   Past Medical History:    Diagnosis Date  . Anxiety   . Arthritis   . BPH (benign prostatic hyperplasia)   . Chronic bronchitis   . COPD (chronic obstructive pulmonary disease) ([place])   . Depression   . Diabetes mellitus without complication ([place])    Resolved; not needing medications at this time.  Marland Kitchen GERD (gastroesophageal reflux disease)   . Hypertension   . Lymphoma, mantle cell ([place])     Past Surgical History:  Procedure Laterality Date  . BIOPSY  10/07/2017   Procedure: BIOPSY;  Surgeon: Daneil Dolin, MD;  Location: [facility];  Service: Endoscopy;;  gastroesophageal junction bx ileocecal valve  . carpal tunnel right hand    . CERVICAL FUSION    . COLONOSCOPY WITH PROPOFOL N/A 10/07/2017   Procedure: COLONOSCOPY WITH PROPOFOL;  Surgeon: Daneil Dolin, MD;  Location: [facility];  Service: Endoscopy;  Laterality: N/A;  10:00am  . ESOPHAGOGASTRODUODENOSCOPY (EGD) WITH PROPOFOL N/A 10/07/2017   Procedure: ESOPHAGOGASTRODUODENOSCOPY (EGD) WITH PROPOFOL;  Surgeon: Daneil Dolin, MD;  Location: [facility];  Service: Endoscopy;  Laterality: N/A;  . IR IMAGING GUIDED PORT INSERTION  10/30/2017  . IR REMOVAL TUN ACCESS W/ PORT W/O FL MOD SED  02/18/2018  . left knee replacement    . left knee revised X 3    . multiple knee arthroscopies    . POLYPECTOMY  10/07/2017   Procedure: POLYPECTOMY;  Surgeon: Daneil Dolin, MD;  Location: [facility];  Service: Endoscopy;;  colon  . right knee replacement    . tarsal tunnel repair    . THYROIDECTOMY,  PARTIAL    . TONSILLECTOMY       Home Medications:  Prior to Admission medications   Medication Sig Start Date End Date Taking? Authorizing Provider  albuterol (PROVENTIL HFA;VENTOLIN HFA) 108 (90 Base) MCG/ACT inhaler Inhale 2 puffs into the lungs every 6 (six) hours as needed for wheezing or shortness of breath.   Yes [provider]  amLODipine (NORVASC) 5 MG tablet Take 5 mg by mouth every evening. 07/01/17  Yes [provider]   aspirin 325 MG tablet Take 325 mg by mouth daily.    Yes [provider]  atorvastatin (LIPITOR) 20 MG tablet Take 20 mg by mouth every evening. Reported on 06/07/2015   Yes [provider]  bendamustine in sodium chloride 0.9 % 50 mL Inject into the vein once.   Yes [provider]  diclofenac (VOLTAREN) 75 MG EC tablet TAKE 1 TABLET TWICE DAILY 02/18/17  Yes Sanjuana Kava, MD  diphenhydrAMINE (BENADRYL) 25 MG tablet Take 1 tablet (25 mg total) by mouth every 6 (six) hours as needed for itching. 12/30/17  Yes Higgs, Mathis Dad, MD  gabapentin (NEURONTIN) 600 MG tablet Take 600 mg by mouth 3 (three) times daily.   Yes [provider]  lidocaine-prilocaine (EMLA) cream Apply to affected area once 10/31/17  Yes Derek Jack, MD  LORazepam (ATIVAN) 2 MG tablet Take 2 mg by mouth at bedtime. 08/15/17  Yes [provider]  metoprolol (LOPRESSOR) 50 MG tablet Take 50 mg by mouth every evening.    Yes [provider]  nitroGLYCERIN (NITROSTAT) 0.4 MG SL tablet Place 0.4 mg under the tongue every 5 (five) minutes x 3 doses as needed. For chest pain   Yes [provider]  omeprazole (PRILOSEC OTC) 20 MG tablet Take 20 mg by mouth 2 (two) times daily. Reported on 06/07/2015   Yes [provider]  oxyCODONE-acetaminophen (PERCOCET) 7.5-325 MG tablet Take 1 tablet by mouth every 4 (four) hours as needed. 02/06/18  Yes Sanjuana Kava, MD  prochlorperazine (COMPAZINE) 10 MG tablet Take 1 tablet (10 mg total) by mouth every 6 (six) hours as needed (Nausea or vomiting). 10/31/17  Yes Derek Jack, MD  riTUXimab (RITUXAN IV) Inject into the vein.   Yes [provider]  Tamsulosin HCl (FLOMAX) 0.4 MG CAPS Take 0.4 mg by mouth daily. Reported on 06/07/2015   Yes [provider]  venlafaxine (EFFEXOR-XR) 150 MG 24 hr capsule Take 150 mg by mouth every evening.    Yes [provider]  acyclovir (ZOVIRAX) 400 MG tablet  Take 1 tablet (400 mg total) by mouth 2 (two) times daily. 01/28/18   Baird Cancer, PA-C  allopurinol (ZYLOPRIM) 300 MG tablet Take 1 tablet (300 mg total) by mouth daily. Patient not taking: Reported on 02/16/2018 10/31/17   Derek Jack, MD    Inpatient Medications: Scheduled Meds: . docusate sodium  100 mg Oral Daily  . famotidine  20 mg Oral QPM  . gabapentin  300 mg Oral Daily  . insulin aspart  0-9 Units Subcutaneous Q4H  . ipratropium  0.5 mg Nebulization Q6H  . levalbuterol  0.63 mg Nebulization Q6H  . mouth rinse  15 mL Mouth Rinse BID   Continuous Infusions: . sodium chloride Stopped (02/18/18 1650)  . sodium chloride 250 mL (02/19/18 1202)  . amiodarone 60 mg/hr (02/19/18 1200)  . DAPTOmycin (CUBICIN)  IV Stopped (02/17/18 2151)   PRN Meds: acetaminophen, lidocaine-EPINEPHrine, ondansetron (ZOFRAN) IV, oxyCODONE-acetaminophen **AND** oxyCODONE, senna  Allergies:  Allergies  Allergen Reactions  . Bee Venom Anaphylaxis    Allergic to bee stings not the kit  . Feldene [Piroxicam] Rash and Dermatitis    Social History:   Social History   Socioeconomic History  . Marital status: Married    Spouse name: Jonelle Sidle  . Number of children: 4  . Years of education: Not on file  . Highest education level: Bachelor's degree (e.g., BA, AB, BS)  Occupational History  . 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 Financial resource strain: Somewhat hard  . Food insecurity:    Worry: Sometimes true    Inability: Never true  . Transportation needs:    Medical: No    Non-medical: No  Tobacco Use  . Smoking status: Current Every Day Smoker    Packs/day: 1.25    Years: 50.00    Pack years: 62.50    Types: Cigarettes  . Smokeless tobacco: Never Used  Substance and Sexual Activity  . Alcohol use: No  . Drug use: No  .  Sexual activity: Yes    Partners: Female    Birth control/protection: None    Comment: spouse  Lifestyle  . Physical activity:    Days per week: 3 days    Minutes per session: 150+ min  . Stress: Very much  Relationships  . Social connections:    Talks on phone: Once a week    Gets together: Never    Attends religious service: Never    Active member of club or organization: No    Attends meetings of clubs or organizations: Never    Relationship status: Married  . Intimate partner violence:    Fear of current or ex partner: No    Emotionally abused: No    Physically abused: No    Forced sexual activity: No  Other Topics Concern  . Not on file  Social History Narrative  . Not on file    Family History:   Family History  Problem Relation Age of Onset  . Colon cancer Maternal Grandmother   . Colon cancer Paternal Grandmother   . Heart disease Mother   . Arthritis Mother   . Prostate cancer Father   . Heart attack Brother   . Heart disease Maternal Aunt   . Cancer Maternal Aunt   . Heart disease Maternal Uncle   . Cancer Maternal Uncle   . Heart disease Paternal Aunt   . Heart disease Paternal Uncle   . Heart disease Brother   . Heart attack Brother   . Arthritis Brother      ROS:  Please see the history of present illness.  All other ROS reviewed and negative.     Physical Exam/Data:   Vitals:   02/19/18 1115 02/19/18 1130 02/19/18 1145 02/19/18 1200  BP: 102/72 99/68 94/73  99/71  Pulse: (!) 34 (!) 136 (!) 151 (!) 107  Resp: 20 16 (!) 25 15  Temp:    97.9 F (36.6 C)  TempSrc:    Oral  SpO2: 94% 100% 100% 100%  Weight:      Height:        Intake/Output Summary (Last 24 hours) at 02/19/2018 1213 Last data filed at 02/19/2018 1200 Gross per 24 hour  Intake 1578.87 ml  Output 2275 ml  Net -696.13 ml  Filed Weights   02/17/18 0500 02/18/18 0436 02/19/18 0200  Weight: 100.7 kg 96.5 kg 94 kg   Body mass index is 28.11 kg/m.  General:  Well  nourished, well developed, in no acute distress, chronically ill in appearance HEENT: normal Lymph: no adenopathy Neck: no JVD Endocrine:  No thryomegaly Vascular: No carotid bruits Cardiac:  irreg-irreg, tachycardic; no murmurs, gallops or rubs are appreciated Lungs:  Crackles b/l  Abd: soft, nontender Ext: no edema Musculoskeletal:  No deformities Skin: warm and dry  Neuro:  No gross focal abnormalities noted Psych:  Normal affect   EKG:  The EKG was personally reviewed and demonstrates:   SR 72bpm > SR, atrial bigemeny, 151bpm > 11/11  00:24 looks ST PACs, AT 00:24 again looks ST/AT, 179  Telemetry:  Telemetry was personally reviewed and demonstrates:   Yesterday ST 110-120's, yesterday afternoon between 2-3:00 into AFib w/RVR 160's Currently AFib 120's  Relevant CV Studies:  02/18/18: TTE Study Conclusions - Left ventricle: The cavity size was normal. Wall thickness was   normal. Systolic function was normal. The estimated ejection   fraction was in the range of 55% to 60%. Impressions: - There was no evidence of a vegetation. There is extreme   tachycardia during the study, limiting the accuracy of the data.   Consider repeat echo when rate is controlled.  Laboratory Data:  Chemistry Recent Labs  Lab 02/18/18 0443 02/18/18 1140 02/19/18 0441  NA 133* 135 138  K 2.9* 3.4* 3.2*  CL 98 101 103  CO2 20* 23 22  GLUCOSE 151* 141* 141*  BUN 74* 81* 102*  CREATININE 3.25* 3.24* 3.42*  CALCIUM 8.0* 8.3* 8.4*  GFRNONAA 18* 18* 17*  GFRAA 21* 21* 19*  ANIONGAP 15 11 13     Recent Labs  Lab 02/23/2018 1253  PROT 5.4*  ALBUMIN 2.6*  AST 40  ALT 24  ALKPHOS 71  BILITOT 1.2   Hematology Recent Labs  Lab 02/17/18 0142 02/18/18 0443 02/19/18 0441  WBC 1.7* 2.8* 2.5*  RBC 2.88* 3.18* 3.22*  HGB 8.7* 9.6* 9.8*  HCT 25.3* 27.0* 26.8*  MCV 87.8 84.9 83.2  MCH 30.2 30.2 30.4  MCHC 34.4 35.6 36.6*  RDW 16.8* 16.7* 16.8*  PLT 44* 39* 40*   Cardiac  Enzymes Recent Labs  Lab 02/16/18 1123 02/16/18 1834 02/17/18 0142  TROPONINI 0.25* 0.24* 0.24*   No results for input(s): TROPIPOC in the last 168 hours.  BNP Recent Labs  Lab 02/18/18 0443  BNP 199.0*    DDimer No results for input(s): DDIMER in the last 168 hours.  Radiology/Studies:   Dg Chest 1 View Result Date: 02/19/2018 CLINICAL DATA:  72 year old male with mantle cell lymphoma undergoing chemotherapy. Neutropenic septic shock, MRSA bacteremia. CHF. Atrial fibrillation with RVR. EXAM: CHEST  1 VIEW COMPARISON:  02/18/2018 and earlier. FINDINGS: Portable AP semi upright view at 1022 hours. Continued low lung volumes. Right chest Port-A-Cath has been removed. Mediastinal contours remain normal. Regressed but not resolved bilateral pulmonary interstitial opacity since 02/16/2018. No pneumothorax or pleural effusion. Visualized tracheal air column is within normal limits. Partially visible ACDF. Stable visible bowel gas pattern. IMPRESSION: 1. Low lung volumes with regressed but not resolved bilateral pulmonary interstitial opacity. Edema is favored over disseminated respiratory infection. 2. No new cardiopulmonary abnormality. Electronically Signed   By: Genevie Ann M.D.   On: 02/19/2018 10:41    Dg Ankle Right Port Result Date: 02/17/2018 CLINICAL DATA:  Pain with redness and swelling.  No  known trauma. EXAM: PORTABLE RIGHT ANKLE - 2 VIEW COMPARISON:  None. FINDINGS: Soft tissue swelling, particularly laterally. Calcification in the plantar fascia. No acute fracture. Lucency over the anterior aspect of the distal tibia on the lateral view only. No other acute abnormalities. IMPRESSION: 1. Soft tissue swelling. 2. No acute fracture. 3. Rounded in the anterior aspect of they tibia on the lateral view only is age indeterminate. Recommend clinical correlation. Electronically Signed   By: Dorise Bullion III M.D   On: 02/17/2018 22:54    Dg Foot 2 Views Right Result Date:  02/17/2018 CLINICAL DATA:  Pain. EXAM: RIGHT FOOT - 2 VIEW COMPARISON:  None. FINDINGS: There is a defect over the anterior inferior aspect of the distal tibia on the lateral view. No other acute abnormalities. IMPRESSION: Rounded defect over the anterior distal tibia on the lateral view may represent sequela of old trauma. Bony erosion not excluded. Electronically Signed   By: Dorise Bullion III M.D   On: 02/17/2018 22:55    Vas [place] Lower Extremity Venous (dvt) Result Date: 02/18/2018  Lower Venous Study Indications: Edema.  Limitations: Body habitus and immobility. Performing Technologist: Lorina Rabon  Examination Guidelines: A complete evaluation includes B-mode imaging, spectral Doppler, color Doppler, and power Doppler as needed of all accessible portions of each vessel. Bilateral testing is considered an integral part of a complete examination. Limited examinations for reoccurring indications may be performed as noted.  Summary: Right: There is no evidence of deep vein thrombosis in the lower extremity. Left: No evidence of common femoral vein obstruction.  *See table(s) above for measurements and observations. Electronically signed by Deitra Mayo MD on 02/18/2018 at 7:32:36 PM.    Final     Assessment and Plan:   1. New onset PAFib w/RVR     In the setting of severe sepsis, hypoxia (CHF, ?pneumonia)     Not on a/c 2/2 thrombocytopenia w/plts of 40  Dr. Curt Bears has seen and examined the patient In d/w CCM, unclear why patient so persistently hypoxic now fluid neg 1726m.  May plan for CT to eval for PE, though renal function is an issue.  Agree with up-titration of amiodarone gtt for better rate control, his BP has been limiting ability to use BB/CCB Not on pressors currently BP currently low 100's, can retry dilt gtt    For questions or updates, please contact [facility] Please consult [email or web address] for contact info under     Signed, RBaldwin Jamaica PA-C   02/19/2018 12:13 PM  I have seen and examined this patient with RTommye Standard  Agree with above, note added to reflect my findings.  On exam, RRR, no murmurs, lungs clear.  Patient with atrial fibrillation rapid rates.  He was admitted for pneumonia.  He is pancytopenic due to his lymphoma.  He is persistently hypoxic at this point.  He is currently on IV amiodarone.  We will continue his amiodarone and use diltiazem as tolerated for rate control.  His blood pressure is borderline low.  It is likely that his rapid atrial fibrillation is related to his overall hypoxia.  As this improves, I do feel that his heart rhythm will also improve.  Will M. Camnitz MD 02/19/2018 5:52 PM

## 2018-02-19 NOTE — Progress Notes (Signed)
Placed pt on servo I in BPAP mode 12/6 80% FIO2. Pt on Medium mask tolerating well. RT will continue to monitor.

## 2018-02-19 NOTE — Progress Notes (Addendum)
Dr Martinique paged and informed of patient's continued heart rate of 160 and was informed to Call Dr Radford Pax

## 2018-02-19 NOTE — Progress Notes (Signed)
[name] for Infectious Disease  Date of Admission:  02/22/2018   Total days of antibiotics 4        Day 1: Vanc, Zosyn        Day 3: Dapto start, d/c vanc, zos         Active Problems:   Sepsis ([name])   Severe sepsis (HCC)   Septic shock (HCC)   AKI (acute kidney injury) ([name])   Hyponatremia   Acute respiratory failure (HCC)   Acute renal failure (HCC)   Bacteremia   Atrial fibrillation with rapid ventricular response (HCC)   Elevated troponin   . docusate sodium  100 mg Oral Daily  . famotidine  20 mg Oral QPM  . gabapentin  300 mg Oral Daily  . insulin aspart  0-9 Units Subcutaneous Q4H  . ipratropium  0.5 mg Nebulization Q6H  . levalbuterol  0.63 mg Nebulization Q6H  . mouth rinse  15 mL Mouth Rinse BID    SUBJECTIVE: ChrisLopez was examined and evaluated at bedside this AM with wife present. He states he feels about the same as yesterday. Was able to tolerate the removal of his chemo-port yesterday without complications. Endorsing no significant pain around the site. Results of the echo and the need for repeat imaging with TEE once his heart rate is controlled was explained to him. ChrisLopez expressed understanding. Denies any fevers, chills, chest pain, palpitations.  Review of Systems: Review of Systems  Constitutional: Negative for chills, fever and malaise/fatigue.  Respiratory: Negative for cough, sputum production and shortness of breath.   Cardiovascular: Negative for chest pain, palpitations and orthopnea.  Gastrointestinal: Positive for nausea. Negative for constipation, diarrhea and vomiting.  Skin: Negative for rash.  Neurological: Negative for sensory change, weakness and headaches.    Past Medical History:  Diagnosis Date  . Anxiety   . Arthritis   . BPH (benign prostatic hyperplasia)   . Chronic bronchitis   . COPD (chronic obstructive pulmonary disease) ([place])   . Depression   . Diabetes mellitus without complication ([place])    Resolved; not needing medications at this time.  Marland Kitchen GERD (gastroesophageal reflux disease)   . Hypertension   . Lymphoma, mantle cell (HCC)     Social History   Tobacco Use  . Smoking status: Current Every Day Smoker    Packs/day: 1.25    Years: 50.00    Pack years: 62.50    Types: Cigarettes  . Smokeless tobacco: Never Used  Substance Use Topics  . Alcohol use: No  . Drug use: No    Family History  Problem Relation Age of Onset  . Colon cancer Maternal Grandmother   . Colon cancer Paternal Grandmother   . Heart disease Mother   . Arthritis Mother   . Prostate cancer Father   . Heart attack Brother   . Heart disease Maternal Aunt   . Cancer Maternal Aunt   . Heart disease Maternal Uncle   . Cancer Maternal Uncle   . Heart disease Paternal Aunt   . Heart disease Paternal Uncle   . Heart disease Brother   . Heart attack Brother   . Arthritis Brother    Allergies  Allergen Reactions  . Bee Venom Anaphylaxis    Allergic to bee stings not the kit  . Feldene [Piroxicam] Rash and Dermatitis    OBJECTIVE: Vitals:   02/19/18 0744 02/19/18 0745 02/19/18 0800 02/19/18 0815  BP: 103/66  111/69 107/76  Pulse: (!) 137 (!) 107 (!) 112 89  Resp: (!) 38 (!) 22 (!) 24 (!) 30  Temp: 98.6 F (37 C)     TempSrc: Axillary     SpO2: 95% 95% 96% 96%  Weight:      Height:       Body mass index is 28.11 kg/m.  Physical Exam  Constitutional: He is oriented to person, place, and time. He appears well-developed and well-nourished. No distress.  HENT:  Head: Normocephalic and atraumatic.  Mouth/Throat: Oropharynx is clear and moist.  Eyes: Pupils are equal, round, and reactive to light. Conjunctivae and EOM are normal.  Neck: Normal range of motion. Neck supple.  Cardiovascular: Normal heart sounds and intact distal pulses.  Irregularly irregular tachycardic  Pulmonary/Chest: Effort normal. He has rales (bibasilar crackles at lung bases).  Abdominal: Soft. Bowel sounds are  normal.  Musculoskeletal: Normal range of motion. He exhibits no edema.  Neurological: He is alert and oriented to person, place, and time.  Skin: Skin is warm and dry.  Chemo port site on anterior chest wall covered with bandaging without obvious surrounding erythema, warmth, swelling, drainage, bleeding.    Lab Results Lab Results  Component Value Date   WBC 2.5 (L) 02/19/2018   HGB 9.8 (L) 02/19/2018   HCT 26.8 (L) 02/19/2018   MCV 83.2 02/19/2018   PLT 40 (L) 02/19/2018    Lab Results  Component Value Date   CREATININE 3.42 (H) 02/19/2018   BUN 102 (H) 02/19/2018   NA 138 02/19/2018   K 3.2 (L) 02/19/2018   CL 103 02/19/2018   CO2 22 02/19/2018    Lab Results  Component Value Date   ALT 24 03/04/2018   AST 40 02/23/2018   ALKPHOS 71 03/03/2018   BILITOT 1.2 03/03/2018     Microbiology: Recent Results (from the past 240 hour(s))  Culture, blood (Routine x 2)     Status: Abnormal   Collection Time: 02/19/2018 12:51 PM  Result Value Ref Range Status   Specimen Description BLOOD LEFT ANTECUBITAL  Final   Special Requests   Final    BOTTLES DRAWN AEROBIC AND ANAEROBIC Blood Culture adequate volume   Culture  Setup Time   Final    GRAM POSITIVE COCCI IN BOTH AEROBIC AND ANAEROBIC BOTTLES Gram Stain Report Called to,Read Back By and Verified With: BROWN,KYLIE (MOSESCONE) @0826  BY MATTHEWS, B 11.10.19 [facility] CRITICAL RESULT CALLED TO, READ BACK BY AND VERIFIED WITH: PHARMD RACHEL R [id number] [id number] FCP Performed at [facility], [place] [address] [place], [place] [address]    Culture METHICILLIN RESISTANT STAPHYLOCOCCUS AUREUS (A)  Final   Report Status 02/19/2018 FINAL  Final   Organism ID, Bacteria METHICILLIN RESISTANT STAPHYLOCOCCUS AUREUS  Final      Susceptibility   Methicillin resistant staphylococcus aureus - MIC*    CIPROFLOXACIN >=8 RESISTANT Resistant     ERYTHROMYCIN >=8 RESISTANT Resistant     GENTAMICIN <=0.5 SENSITIVE Sensitive     OXACILLIN  >=4 RESISTANT Resistant     TETRACYCLINE <=1 SENSITIVE Sensitive     VANCOMYCIN <=0.5 SENSITIVE Sensitive     TRIMETH/SULFA <=10 SENSITIVE Sensitive     CLINDAMYCIN <=0.25 SENSITIVE Sensitive     RIFAMPIN <=0.5 SENSITIVE Sensitive     Inducible Clindamycin NEGATIVE Sensitive     * METHICILLIN RESISTANT STAPHYLOCOCCUS AUREUS  Blood Culture ID Panel (Reflexed)     Status: Abnormal   Collection Time: 03/05/2018 12:51 PM  Result Value Ref Range Status  Enterococcus species NOT DETECTED NOT DETECTED Final   Listeria monocytogenes NOT DETECTED NOT DETECTED Final   Staphylococcus species DETECTED (A) NOT DETECTED Final    Comment: CRITICAL RESULT CALLED TO, READ BACK BY AND VERIFIED WITH: PHARMD RACHEL R 1454 [id number] FCP    Staphylococcus aureus (BCID) DETECTED (A) NOT DETECTED Final    Comment: Methicillin (oxacillin)-resistant Staphylococcus aureus (MRSA). MRSA is predictably resistant to beta-lactam antibiotics (except ceftaroline). Preferred therapy is vancomycin unless clinically contraindicated. Patient requires contact precautions if  hospitalized. CRITICAL RESULT CALLED TO, READ BACK BY AND VERIFIED WITH: PHARMD RACHEL R 1454 [id number] FCP    Methicillin resistance DETECTED (A) NOT DETECTED Final    Comment: CRITICAL RESULT CALLED TO, READ BACK BY AND VERIFIED WITH: PHARMD RACHEL R 1454 [id number] FCP    Streptococcus species NOT DETECTED NOT DETECTED Final   Streptococcus agalactiae NOT DETECTED NOT DETECTED Final   Streptococcus pneumoniae NOT DETECTED NOT DETECTED Final   Streptococcus pyogenes NOT DETECTED NOT DETECTED Final   Acinetobacter baumannii NOT DETECTED NOT DETECTED Final   Enterobacteriaceae species NOT DETECTED NOT DETECTED Final   Enterobacter cloacae complex NOT DETECTED NOT DETECTED Final   Escherichia coli NOT DETECTED NOT DETECTED Final   Klebsiella oxytoca NOT DETECTED NOT DETECTED Final   Klebsiella pneumoniae NOT DETECTED NOT DETECTED Final   Proteus species NOT  DETECTED NOT DETECTED Final   Serratia marcescens NOT DETECTED NOT DETECTED Final   Haemophilus influenzae NOT DETECTED NOT DETECTED Final   Neisseria meningitidis NOT DETECTED NOT DETECTED Final   Pseudomonas aeruginosa NOT DETECTED NOT DETECTED Final   Candida albicans NOT DETECTED NOT DETECTED Final   Candida glabrata NOT DETECTED NOT DETECTED Final   Candida krusei NOT DETECTED NOT DETECTED Final   Candida parapsilosis NOT DETECTED NOT DETECTED Final   Candida tropicalis NOT DETECTED NOT DETECTED Final    Comment: Performed at [facility], [place]. [address] [place], [place] [address]  Culture, blood (Routine x 2)     Status: None (Preliminary result)   Collection Time: 03/08/2018  1:08 PM  Result Value Ref Range Status   Specimen Description BLOOD LEFT HAND BOTTLES DRAWN AEROBIC ONLY  Final   Special Requests   Final    Blood Culture results may not be optimal due to an inadequate volume of blood received in culture bottles   Culture   Final    NO GROWTH 4 DAYS Performed at [facility], [address], [place], [place] [address]    Report Status PENDING  Incomplete  Urine Culture     Status: None   Collection Time: 02/16/18 11:24 AM  Result Value Ref Range Status   Specimen Description URINE, RANDOM  Final   Special Requests NONE  Final   Culture   Final    NO GROWTH Performed at [facility], [place] [address] [place], [place] [address]    Report Status 02/17/2018 FINAL  Final  Culture, blood (Routine X 2) w Reflex to ID Panel     Status: None (Preliminary result)   Collection Time: 02/16/18 11:35 AM  Result Value Ref Range Status   Specimen Description BLOOD RIGHT HAND  Final   Special Requests   Final    BOTTLES DRAWN AEROBIC ONLY Blood Culture adequate volume   Culture   Final    NO GROWTH 2 DAYS Performed at [facility], [place] [address] [place], [place] [address]    Report Status PENDING  Incomplete  Culture,  blood (Routine X 2) w Reflex to ID Panel      Status: None (Preliminary result)   Collection Time: 02/16/18 11:40 AM  Result Value Ref Range Status   Specimen Description BLOOD RIGHT HAND  Final   Special Requests   Final    BOTTLES DRAWN AEROBIC ONLY Blood Culture adequate volume   Culture   Final    NO GROWTH 2 DAYS Performed at [facility], [place] [address] [place], [place] [address]    Report Status PENDING  Incomplete  Respiratory Panel by PCR     Status: None   Collection Time: 02/16/18 11:46 AM  Result Value Ref Range Status   Adenovirus NOT DETECTED NOT DETECTED Final   Coronavirus 229E NOT DETECTED NOT DETECTED Final   Coronavirus HKU1 NOT DETECTED NOT DETECTED Final   Coronavirus NL63 NOT DETECTED NOT DETECTED Final   Coronavirus OC43 NOT DETECTED NOT DETECTED Final   Metapneumovirus NOT DETECTED NOT DETECTED Final   Rhinovirus / Enterovirus NOT DETECTED NOT DETECTED Final   Influenza A NOT DETECTED NOT DETECTED Final   Influenza B NOT DETECTED NOT DETECTED Final   Parainfluenza Virus 1 NOT DETECTED NOT DETECTED Final   Parainfluenza Virus 2 NOT DETECTED NOT DETECTED Final   Parainfluenza Virus 3 NOT DETECTED NOT DETECTED Final   Parainfluenza Virus 4 NOT DETECTED NOT DETECTED Final   Respiratory Syncytial Virus NOT DETECTED NOT DETECTED Final   Bordetella pertussis NOT DETECTED NOT DETECTED Final   Chlamydophila pneumoniae NOT DETECTED NOT DETECTED Final   Mycoplasma pneumoniae NOT DETECTED NOT DETECTED Final    Comment: Performed at [facility], [place] [address] [place], [place] [address]  MRSA PCR Screening     Status: None   Collection Time: 02/17/18 10:56 AM  Result Value Ref Range Status   MRSA by PCR NEGATIVE NEGATIVE Final    Comment:        The GeneXpert MRSA Assay (FDA approved for NASAL specimens only), is one component of a comprehensive MRSA colonization surveillance program. It is not intended to diagnose MRSA infection nor to guide or monitor treatment for MRSA  infections. Performed at [facility], [place] [address], [place],  [address]      ASSESSMENT: Chris Lopez is a 72yo M w/ Stage IVb mantle cell lymphoma on chemo admit for MRSA bacteremia. TTE yesterday was poor study due to tachycardia but no obvious vegetation was visualized. He will need TEE to re-evaluate the valves once his heart rate is better controlled. Tolerated chemo port removal well without issues.  PLAN: 1. MRSA bacteremia - F/u repeat blood cultures (no growth for 3 days) - C/w dapto - TEE once heart rate better controlled (currently 140s)  Mosetta Anis, MD, PGY1 02/19/2018, 8:37 AM

## 2018-02-19 NOTE — Progress Notes (Signed)
Dr Radford Pax returned page and informed of continued heart rate 160 .She states that she is going to consult EP

## 2018-02-19 NOTE — Progress Notes (Signed)
NAME:  Chris Lopez, MRN:  [id number], DOB:  06-Sep-1945, LOS: 0 ADMISSION DATE:  03/02/2018, CONSULTATION DATE: 03/07/2018 REFERRING MD: [facility], CHIEF COMPLAINT: Body aches  Brief History   Chris Lopez 72 year old male with history of mantle cell lymphoma undergoing chemotherapy coming in with generalized body aches and hypotension, sepsis with GPC bacteremia  Past Medical History  Mantle cell lymphoma, Anxiety, Arthritis, BPH (benign prostatic hyperplasia), Chronic bronchitis, COPD (chronic obstructive pulmonary disease) ([place]), Depression, Diabetes mellitus without complication ([place]), GERD (gastroesophageal reflux disease), Hypertension,  Significant Hospital Events   11/9 > admit from [facility] ED  Consults: date of consult/date signed off & final recs:    Procedures (surgical and bedside):  Rt chemo port >> 11/12  Significant Diagnostic Tests:    Micro Data:  Blood culture APH 11/9 >> 3/4 MRSA Blood culture 11/10 > NGTD  Antimicrobials:  11/9 Zosyn> 11/10 11/9 Vancomycin> 11/10 11/10 Daptomycin >   Subjective:  Remains in Atrial fib Still hypoxemia Has received multiple boluses of amiodarone overnight, had metoprolol Port removed yesterday  Objective   Blood pressure 99/71, pulse (!) 107, temperature 97.9 F (36.6 C), temperature source Oral, resp. rate 15, height 6' (1.829 m), weight 94 kg, SpO2 100 %.    Vent Mode: BIPAP FiO2 (%):  [60 %-100 %] 100 % PEEP:  [6 cmH20] 6 cmH20   Intake/Output Summary (Last 24 hours) at 02/19/2018 1233 Last data filed at 02/19/2018 1200 Gross per 24 hour  Intake 1578.87 ml  Output 2275 ml  Net -696.13 ml   Filed Weights   02/17/18 0500 02/18/18 0436 02/19/18 0200  Weight: 100.7 kg 96.5 kg 94 kg    Examination:  General:  Resting comfortably in bed HENT: NCAT OP clear PULM: few crackles in bases, normal effort CV: Irreg irreg, no mgr GI: BS+, soft, nontender MSK: normal bulk and tone Neuro: awake,  alert, no distress, MAEW    Resolved Hospital Problem list     Assessment & Plan:  72 year old with mantle cell lymphoma on chemotherapy admitted with neutropenia, septic shock, MRSA bacteremia, skin changes R foot concerning for cellulitis, also with atrial fibrillation with RVR. Had been receiving treatment for lymphoma with bendamustine and rituximab prior to admission.   Acute respiratory failure with hypoxemia: no improvement despite diuresis, could this represent drug toxicity from chemo? Onset of hypoxemia preceded amiodarone and daptomycin so I doubt those are the cause. > d/c lasix > Titrate O2 to maintain O2 saturaion > 90% > HRCT chest today > consider V/Q scan if HRCT unremarkable  MRSA bacteremia, neutropenia, port has been removed >dapto per ID  Atrial fibrillation with RVR > keep K > 4, Mg > 2 > continue amiodarone > diltiazem drip today > cardiology support appreciated  Acute pulmonary edema> now net negative, suspect that there is an alternate cause  AKI Monitor BMET and UOP Replace electrolytes as needed Hold lasix  Hypokalemia Replace K again  Pancytopenia, neutropenia secondary to chemotherapy for mantle cell lymphoma Monitor CBC  COPD Continue duoneb  Home gabapentin dependence Continue low dose  CODE STATUS: DNR  Disposition / Summary of Today's Plan 02/19/18   Read above    Diet: advance diet Pain/Anxiety/Delirium protocol (if indicated): NA VAP protocol (if indicated): NA DVT prophylaxis: SCDs.  GI prophylaxis: NA Hyperglycemia protocol: NA Mobility: bedrest Code Status: DNR Family Communication: wife updated bedside by me 11/11  The patient is critically ill with multiple organ system failure and requires high complexity decision  making for assessment and support, frequent evaluation and titration of therapies, advanced monitoring, review of radiographic studies and interpretation of complex data.   Critical Care Time devoted to  patient care services, exclusive of separately billable procedures, described in this note is 35 minutes.   Roselie Awkward, MD [facility] Pager: [phone] Cell: [phone] After 3pm or if no response, call [phone]  02/19/2018, 12:33 PM

## 2018-02-20 ENCOUNTER — Telehealth: Payer: Self-pay | Admitting: Orthopaedic Surgery

## 2018-02-20 DIAGNOSIS — I76 Septic arterial embolism: Secondary | ICD-10-CM

## 2018-02-20 DIAGNOSIS — I2690 Septic pulmonary embolism without acute cor pulmonale: Secondary | ICD-10-CM

## 2018-02-20 DIAGNOSIS — R0603 Acute respiratory distress: Secondary | ICD-10-CM

## 2018-02-20 LAB — GLUCOSE, CAPILLARY
GLUCOSE-CAPILLARY: 157 mg/dL — AB (ref 70–99)
GLUCOSE-CAPILLARY: 122 mg/dL — AB (ref 70–99)
Glucose-Capillary: 118 mg/dL — ABNORMAL HIGH (ref 70–99)
Glucose-Capillary: 143 mg/dL — ABNORMAL HIGH (ref 70–99)
Glucose-Capillary: 135 mg/dL — ABNORMAL HIGH (ref 70–99)

## 2018-02-20 LAB — CBC WITH DIFFERENTIAL/PLATELET
Abs Immature Granulocytes: 1 10*3/uL — ABNORMAL HIGH (ref 0.00–0.07)
BASOS ABS: 0 10*3/uL (ref 0.0–0.1)
BASOS PCT: 1 %
EOS ABS: 0.6 10*3/uL — AB (ref 0.0–0.5)
EOS PCT: 21 %
HCT: 27.9 % — ABNORMAL LOW (ref 39.0–52.0)
Hemoglobin: 10.1 g/dL — ABNORMAL LOW (ref 13.0–17.0)
Immature Granulocytes: 36 %
Lymphocytes Relative: 1 %
Lymphs Abs: 0 10*3/uL — ABNORMAL LOW (ref 0.7–4.0)
MCH: 30.1 pg (ref 26.0–34.0)
MCHC: 36.2 g/dL — AB (ref 30.0–36.0)
MCV: 83.3 fL (ref 80.0–100.0)
Monocytes Absolute: 0.4 10*3/uL (ref 0.1–1.0)
Monocytes Relative: 14 %
Neutro Abs: 0.8 10*3/uL — ABNORMAL LOW (ref 1.7–7.7)
Neutrophils Relative %: 27 %
Platelets: 48 10*3/uL — ABNORMAL LOW (ref 150–400)
RBC: 3.35 MIL/uL — AB (ref 4.22–5.81)
RDW: 17 % — AB (ref 11.5–15.5)
WBC: 2.8 10*3/uL — AB (ref 4.0–10.5)
nRBC: 0 % (ref 0.0–0.2)

## 2018-02-20 LAB — BASIC METABOLIC PANEL
Anion gap: 14 (ref 5–15)
BUN: 123 mg/dL — ABNORMAL HIGH (ref 8–23)
CALCIUM: 8.2 mg/dL — AB (ref 8.9–10.3)
CHLORIDE: 103 mmol/L (ref 98–111)
CO2: 23 mmol/L (ref 22–32)
CREATININE: 3.36 mg/dL — AB (ref 0.61–1.24)
GFR, EST AFRICAN AMERICAN: 20 mL/min — AB (ref >60)
GFR, EST NON AFRICAN AMERICAN: 17 mL/min — AB (ref >60)
Glucose, Bld: 164 mg/dL — ABNORMAL HIGH (ref 70–99)
Potassium: 3.3 mmol/L — ABNORMAL LOW (ref 3.5–5.1)
SODIUM: 140 mmol/L (ref 135–145)

## 2018-02-20 LAB — POCT I-STAT 3, ART BLOOD GAS (G3+)
ACID-BASE DEFICIT: 3 mmol/L — AB (ref 0.0–2.0)
Bicarbonate: 21.8 mmol/L (ref 20.0–28.0)
O2 SAT: 94 %
PH ART: 7.401 (ref 7.350–7.450)
PO2 ART: 68 mmHg — AB (ref 83.0–108.0)
TCO2: 23 mmol/L (ref 22–32)
pCO2 arterial: 35 mmHg (ref 32.0–48.0)

## 2018-02-20 LAB — CULTURE, BLOOD (ROUTINE X 2)

## 2018-02-20 MED ORDER — SENNA 8.6 MG PO TABS
1.0000 | ORAL_TABLET | Freq: Every day | ORAL | Status: DC
  Filled 2018-02-20: qty 1

## 2018-02-20 MED ORDER — SODIUM CHLORIDE 0.9 % IV SOLN
100.0000 mg | Freq: Two times a day (BID) | INTRAVENOUS | Status: DC
  Administered 2018-02-20 – 2018-02-26 (×12): 100 mg via INTRAVENOUS
  Filled 2018-02-20 (×14): qty 100

## 2018-02-20 MED ORDER — SODIUM CHLORIDE 0.9 % IV SOLN
INTRAVENOUS | Status: DC
  Administered 2018-02-20 – 2018-02-22 (×2): via INTRAVENOUS

## 2018-02-20 MED ORDER — FENTANYL CITRATE (PF) 100 MCG/2ML IJ SOLN
50.0000 ug | INTRAMUSCULAR | Status: DC | PRN
  Administered 2018-02-24: 25 ug via INTRAVENOUS
  Administered 2018-02-24 – 2018-02-25 (×2): 50 ug via INTRAVENOUS
  Filled 2018-02-20 (×4): qty 2

## 2018-02-20 MED ORDER — FAMOTIDINE IN NACL 20-0.9 MG/50ML-% IV SOLN
20.0000 mg | Freq: Two times a day (BID) | INTRAVENOUS | Status: DC
  Administered 2018-02-20 – 2018-02-21 (×3): 20 mg via INTRAVENOUS
  Filled 2018-02-20 (×3): qty 50

## 2018-02-20 MED ORDER — AMIODARONE IV BOLUS ONLY 150 MG/100ML
150.0000 mg | Freq: Once | INTRAVENOUS | Status: AC
  Administered 2018-02-20: 150 mg via INTRAVENOUS

## 2018-02-20 MED ORDER — POTASSIUM CHLORIDE 10 MEQ/100ML IV SOLN
10.0000 meq | INTRAVENOUS | Status: AC
  Administered 2018-02-20 (×4): 10 meq via INTRAVENOUS
  Filled 2018-02-20 (×4): qty 100

## 2018-02-20 MED ORDER — POLYETHYLENE GLYCOL 3350 17 G PO PACK
17.0000 g | PACK | Freq: Every day | ORAL | Status: DC
  Filled 2018-02-20: qty 1

## 2018-02-20 MED ORDER — DOXYCYCLINE HYCLATE 100 MG PO TABS
100.0000 mg | ORAL_TABLET | Freq: Two times a day (BID) | ORAL | Status: DC
  Filled 2018-02-20: qty 1

## 2018-02-20 MED ORDER — ACETAMINOPHEN 650 MG RE SUPP: 650.0000 mg | RECTAL | Status: DC | PRN

## 2018-02-20 NOTE — Progress Notes (Addendum)
NAME:  Chris Lopez, MRN:  [id number], DOB:  03/23/1946, LOS: 0 ADMISSION DATE:  02/23/2018, CONSULTATION DATE: 02/13/2018 REFERRING MD: [facility], CHIEF COMPLAINT: Body aches  Brief History   Mr. Snelling 72 year old male with history of mantle cell lymphoma undergoing chemotherapy coming in with generalized body aches and hypotension, sepsis with GPC bacteremia  Past Medical History  Mantle cell lymphoma, Anxiety, Arthritis, BPH (benign prostatic hyperplasia), Chronic bronchitis, COPD (chronic obstructive pulmonary disease) ([place]), Depression, Diabetes mellitus without complication ([place]), GERD (gastroesophageal reflux disease), Hypertension,  Significant Hospital Events   11/9 > admit from [facility] ED 11/11> consult cardiology, ID, IR 11/12> Port removed 11/14> EP consulted  Consults: date of consult/date signed off & final recs:  11/11> consult cardiology, ID, IR 11/14> EP consulted  Procedures (surgical and bedside):  Rt chemo port >> 11/12  Significant Diagnostic Tests:  Echo 11/12: EF 55-60%, no veg High res CT chest 11/14: multiple cavitary lesions, mild diffuse brochial wall thickening with mild centrilobar and paraseptal emphysema  Micro Data:  Blood culture APH 11/9 >> 3/4 MRSA Blood culture 11/10 > NGTD  Antimicrobials:  11/9 Zosyn> 11/10 11/9 Vancomycin> 11/10 11/10 Daptomycin >   Subjective:  Remains in afib, started on dilt gtt. EP following. Feeling much better today.  Objective   Blood pressure (!) 92/58, pulse (!) 121, temperature 99.1 F (37.3 C), temperature source Axillary, resp. rate 17, height 6' (1.829 m), weight 93.8 kg, SpO2 93 %.    Vent Mode: BIPAP FiO2 (%):  [60 %-100 %] 80 % PEEP:  [6 cmH20] 6 cmH20   Intake/Output Summary (Last 24 hours) at 02/20/2018 5638 Last data filed at 02/20/2018 0900 Gross per 24 hour  Intake 2053.97 ml  Output 1925 ml  Net 128.97 ml   Filed Weights   02/18/18 0436 02/19/18 0200 02/20/18 0500    Weight: 96.5 kg 94 kg 93.8 kg    Examination:  General: 72 year old male, resting comfortably on BiPAP, no acute distress HENT: bipap in place, [place]/at PULM: few crackles Bilateral lower lobes, on bipap, no accessory muscle use CV: irregularly, irregular, tachycardic, no m/r/g appreciated, No edema in BLE GI: BS+, soft, nontender MSK: normal bulk and tone Neuro: awake, alert, no distress, MAEW  Resolved Hospital Problem list     Assessment & Plan:  72 year old with mantle cell lymphoma on chemotherapy admitted with neutropenia, septic shock, MRSA bacteremia, skin changes R foot concerning for cellulitis, also with atrial fibrillation with RVR. Had been receiving treatment for lymphoma with bendamustine and rituximab prior to admission. High res ct showing multiple cavitary lesions and confluent airspace disease in BLL.  Acute respiratory failure w/ hypoxemia - Wean off bipap as able - pulm toilet  - xopenex/atrovent as needed  MRSA bacteremia, neutropenia, port has been removed > on dapto, consider switch to alternative for lung penetration > ID following appreciate recs  Atrial fibrillation with RVR > keep K > 4, Mg > 2 > continue amiodarone > continue diltiazem drip > Electrophysiology consult appreciated  Acute kidney injury Baseline appears to be 1 from bmet in 2011 but nothing more recent. > Monitor BMET and UOP > Replace electrolytes as needed > Hold lasix  Hypokalemia IV kcl 38meq x 4  Pancytopenia, neutropenia secondary to chemotherapy for mantle cell lymphoma Monitor CBC  COPD Continue duoneb  Home gabapentin dependence Continue low dose  CODE STATUS: DNR  Disposition / Summary of Today's Plan 02/20/18   Further rate control of HR Continue  dapto Replete k    Diet: NPO while on bipap Pain/Anxiety/Delirium protocol (if indicated): NA VAP protocol (if indicated): NA DVT prophylaxis: SCDs.  GI prophylaxis: pepcid Hyperglycemia protocol:  NA Mobility: bedrest Code Status: DNR Family Communication: wife updated bedside  Guadalupe Dawn MD PGY-2 Family Medicine Resident 02/20/2018, 9:21 AM

## 2018-02-20 NOTE — Care Management Note (Addendum)
Case Management Note  Patient Details  Name: Chris Lopez MRN: [id number] Date of Birth: January 03, 1946  Subjective/Objective:   Pt admitted with sepsis                 Action/Plan:  PTA from home with wife   Expected Discharge Date:                  Expected Discharge Plan:     In-House Referral:  Clinical Social Work  Discharge planning Services  CM Consult  Post Acute Care Choice:    Choice offered to:     DME Arranged:    DME Agency:     HH Arranged:    [place] Agency:     Status of Service:     If discussed at [employer], dates discussed:    Additional Comments: 02/20/2018 Pt now requiring continuous BIPAP, on Cardizem and amiodarone drip for a fib.  CM will continue to follow for discharge needs Maryclare Labrador, RN 02/20/2018, 3:50 PM

## 2018-02-20 NOTE — Progress Notes (Addendum)
Progress Note  Patient Name: Chris Lopez Date of Encounter: 02/20/2018  Primary Cardiologist: No primary care provider on file.   Subjective   Currently on BiPAP with 80% FiO2 with O2 sats of 93%.  Remains in atrial fibrillation with RVR 120 250 bpm. Inpatient Medications    Scheduled Meds: . docusate sodium  100 mg Oral Daily  . famotidine  20 mg Oral QPM  . feeding supplement (ENSURE ENLIVE)  237 mL Oral BID BM  . gabapentin  300 mg Oral Daily  . insulin aspart  0-9 Units Subcutaneous Q4H  . ipratropium  0.5 mg Nebulization Q6H  . levalbuterol  0.63 mg Nebulization Q6H  . mouth rinse  15 mL Mouth Rinse BID  . polyethylene glycol  17 g Oral Daily  . senna  1 tablet Oral Daily   Continuous Infusions: . sodium chloride Stopped (02/18/18 1650)  . sodium chloride 10 mL/hr at 02/20/18 0900  . amiodarone 60 mg/hr (02/20/18 0957)  . DAPTOmycin (CUBICIN)  IV Stopped (02/19/18 2032)  . diltiazem (CARDIZEM) infusion 15 mg/hr (02/20/18 0330)  . potassium chloride     PRN Meds: acetaminophen, lidocaine-EPINEPHrine, ondansetron (ZOFRAN) IV, oxyCODONE-acetaminophen **AND** oxyCODONE   Vital Signs    Vitals:   02/20/18 0700 02/20/18 0751 02/20/18 0752 02/20/18 0800  BP:    (!) 92/58  Pulse: (!) 120  (!) 152 (!) 121  Resp: 16  (!) 27 17  Temp:  99.1 F (37.3 C)    TempSrc:  Axillary    SpO2: 98%  95% 93%  Weight:      Height:        Intake/Output Summary (Last 24 hours) at 02/20/2018 1018 Last data filed at 02/20/2018 0900 Gross per 24 hour  Intake 1935.1 ml  Output 1925 ml  Net 10.1 ml   Filed Weights   02/18/18 0436 02/19/18 0200 02/20/18 0500  Weight: 96.5 kg 94 kg 93.8 kg    Telemetry  She will fibrillation with RVR to 134 bpm- Personally Reviewed  ECG    No new EKG to review- Personally Reviewed  Physical Exam   GEN: Cachectic and ill-appearing HEENT: Normal NECK: No JVD; No carotid bruits LYMPHATICS: No lymphadenopathy CARDIAC: Irregularly  irregular and tachycardic, no murmurs, rubs, gallops RESPIRATORY: Decreased breath sounds throughout anteriorly ABDOMEN: Soft, non-tender, non-distended MUSCULOSKELETAL:  No edema; No deformity  SKIN: Warm and dry NEUROLOGIC:  Alert and oriented x 3 PSYCHIATRIC:  Normal affect    Labs    Chemistry Recent Labs  Lab 02/10/2018 1253  02/18/18 1140 02/19/18 0441 02/20/18 0349  NA 124*   < > 135 138 140  K 4.2   < > 3.4* 3.2* 3.3*  CL 92*   < > 101 103 103  CO2 20*   < > 23 22 23   GLUCOSE 90   < > 141* 141* 164*  BUN 55*   < > 81* 102* 123*  CREATININE 3.56*   < > 3.24* 3.42* 3.36*  CALCIUM 7.2*   < > 8.3* 8.4* 8.2*  PROT 5.4*  --   --   --   --   ALBUMIN 2.6*  --   --   --   --   AST 40  --   --   --   --   ALT 24  --   --   --   --   ALKPHOS 71  --   --   --   --   BILITOT 1.2  --   --   --   --  GFRNONAA 16*   < > 18* 17* 17*  GFRAA 18*   < > 21* 19* 20*  ANIONGAP 12   < > 11 13 14    < > = values in this interval not displayed.     Hematology Recent Labs  Lab 02/18/18 0443 02/19/18 0441 02/20/18 0349  WBC 2.8* 2.5* 2.8*  RBC 3.18* 3.22* 3.35*  HGB 9.6* 9.8* 10.1*  HCT 27.0* 26.8* 27.9*  MCV 84.9 83.2 83.3  MCH 30.2 30.4 30.1  MCHC 35.6 36.6* 36.2*  RDW 16.7* 16.8* 17.0*  PLT 39* 40* 48*    Cardiac Enzymes Recent Labs  Lab 02/16/18 1123 02/16/18 1834 02/17/18 0142  TROPONINI 0.25* 0.24* 0.24*   No results for input(s): TROPIPOC in the last 168 hours.   BNP Recent Labs  Lab 02/18/18 0443  BNP 199.0*     DDimer No results for input(s): DDIMER in the last 168 hours.   Radiology    Dg Chest 1 View  Result Date: 02/19/2018 CLINICAL DATA:  72 year old male with mantle cell lymphoma undergoing chemotherapy. Neutropenic septic shock, MRSA bacteremia. CHF. Atrial fibrillation with RVR. EXAM: CHEST  1 VIEW COMPARISON:  02/18/2018 and earlier. FINDINGS: Portable AP semi upright view at 1022 hours. Continued low lung volumes. Right chest Port-A-Cath has  been removed. Mediastinal contours remain normal. Regressed but not resolved bilateral pulmonary interstitial opacity since 02/16/2018. No pneumothorax or pleural effusion. Visualized tracheal air column is within normal limits. Partially visible ACDF. Stable visible bowel gas pattern. IMPRESSION: 1. Low lung volumes with regressed but not resolved bilateral pulmonary interstitial opacity. Edema is favored over disseminated respiratory infection. 2. No new cardiopulmonary abnormality. Electronically Signed   By: Genevie Ann M.D.   On: 02/19/2018 10:41   Dg Chest 1 View  Result Date: 02/18/2018 CLINICAL DATA:  SOB. CHF.  Hx of HTN, DM, COPD, chronic bronchitis. EXAM: CHEST  1 VIEW COMPARISON:  02/17/2018 FINDINGS: Port in the anterior chest wall with tip in distal SVC. Anterior cervical fusion. Low lung volumes. LEFT basilar atelectasis similar prior. Fine interstitial pattern similar prior. Bone island in the RIGHT second rib. IMPRESSION: 1. Mild interstitial edema. 2. LEFT basilar atelectasis. 3. Minimal change from prior. Electronically Signed   By: Suzy Bouchard M.D.   On: 02/18/2018 12:08   Ct Chest High Resolution  Result Date: 02/20/2018 CLINICAL DATA:  72 year old male on BiPAP. Evaluate for interstitial lung disease. EXAM: CT CHEST WITHOUT CONTRAST TECHNIQUE: Multidetector CT imaging of the chest was performed following the standard protocol without intravenous contrast. High resolution imaging of the lungs, as well as inspiratory and expiratory imaging, was performed. COMPARISON:  PET-CT 01/20/2018. FINDINGS: Cardiovascular: Heart size is mildly enlarged. There is no significant pericardial fluid, thickening or pericardial calcification. There is aortic atherosclerosis, as well as atherosclerosis of the great vessels of the mediastinum and the coronary arteries, including calcified atherosclerotic plaque in the left main, left anterior descending, left circumflex and right coronary arteries.  Mediastinum/Nodes: No pathologically enlarged mediastinal or hilar lymph nodes. Please note that accurate exclusion of hilar adenopathy is limited on noncontrast CT scans. Esophagus is unremarkable in appearance. No axillary lymphadenopathy. Lungs/Pleura: Today's study is limited by patient respiratory motion. With these limitations in mind, there are some areas of relatively diffuse ground-glass attenuation scattered throughout the lungs bilaterally, most evident throughout the mid to lower lungs, new compared to the recent prior study from 01/20/2018. New areas of airspace consolidation are also noted in the lower lobes of the lungs  bilaterally (left greater than right), where there also appears to be substantial passive subsegmental atelectasis. A few scattered cavitary nodules are also noted on today's examination, new compared to the prior study, largest of which is in the anterior aspect of the left upper lobe (axial image 85 of series 5) measuring 2.2 x 2.1 cm in diameter, presumably infectious or inflammatory in etiology. No significant regions of traction bronchiectasis or honeycombing are identified. Inspiratory and expiratory imaging demonstrates mild air trapping indicative of small airways disease. Trace bilateral pleural effusions lying dependently. Diffuse bronchial wall thickening with mild centrilobular and paraseptal emphysema. Upper Abdomen: There are a few scattered calcified granulomas throughout the visualized liver and spleen. Aortic atherosclerosis. Musculoskeletal: Orthopedic fixation hardware in the lower cervical spine incidentally noted. There are no aggressive appearing lytic or blastic lesions noted in the visualized portions of the skeleton. IMPRESSION: 1. Although there are some limitations of today's examination, there are no findings on today's study to strongly suggest interstitial lung disease. Additionally, prior PET-CT 01/20/2018 demonstrated no suspicious findings. 2. Today's  study does demonstrate new multifocal cavitary pulmonary nodules, suspicious for potential infectious or inflammatory etiology, including septic embolism. There also appears to be areas of more confluent airspace consolidation in the lower lobes of the lungs bilaterally, which are associated with some significant atelectasis as well. 3. Trace bilateral pleural effusions. 4. Mild cardiomegaly with areas of ground-glass attenuation throughout the mid to lower lungs bilaterally, favored to reflect a background of mild interstitial pulmonary edema. 5. Aortic atherosclerosis, in addition to left main and 3 vessel coronary artery disease. Assessment for potential risk factor modification, dietary therapy or pharmacologic therapy may be warranted, if clinically indicated. 6. Mild diffuse bronchial wall thickening with mild centrilobular and paraseptal emphysema; imaging findings suggestive of underlying COPD. Aortic Atherosclerosis (ICD10-I70.0) and Emphysema (ICD10-J43.9). Electronically Signed   By: Vinnie Langton M.D.   On: 02/20/2018 08:17   Ir Removal [employer] W/ [place] W/o Fl Mod Sed  Result Date: 02/18/2018 INDICATION: 72 year old with history of mantle cell lymphoma. Port-A-Cath was placed on 10/30/2017 by Dr. Kathlene Cote. Patient is currently admitted with bacteremia. Request for Port-A-Cath removal. EXAM: REMOVAL RIGHT IJ VEIN PORT-A-CATH MEDICATIONS: Inpatient receiving antibiotics ANESTHESIA/SEDATION: None FLUOROSCOPY TIME:  None COMPLICATIONS: None immediate. PROCEDURE: Informed consent was obtained for Port-A-Cath removal. All questions were addressed. Maximal Sterile Barrier Technique was utilized including caps, mask, sterile gowns, sterile gloves, sterile drape, hand hygiene and skin antiseptic. A timeout was performed prior to the initiation of the procedure. The right chest was prepped and draped in a sterile fashion. Lidocaine with epinephrine was utilized for local anesthesia. An incision was made  over the previously healed surgical incision. Utilizing blunt dissection, the port catheter and reservoir were removed from the underlying subcutaneous tissue in their entirety. The pocket was irrigated with a copious amount of sterile normal saline. The subcutaneous tissue was closed with 3-0 Vicryl interrupted subcutaneous stitches. A 4-0 Vicryl running subcuticular stitch was utilized to approximate the skin. Dermabond was applied. FINDINGS: Healthy-looking subcutaneous pocket. No drainage from the subcutaneous pocket. IMPRESSION: Successful right IJ vein Port-A-Cath explant. Electronically Signed   By: Markus Daft M.D.   On: 02/18/2018 18:22   Vas [place] Lower Extremity Venous (dvt)  Result Date: 02/18/2018  Lower Venous Study Indications: Edema.  Limitations: Body habitus and immobility. Performing Technologist: Lorina Rabon  Examination Guidelines: A complete evaluation includes B-mode imaging, spectral Doppler, color Doppler, and power Doppler as needed of all accessible portions of each  vessel. Bilateral testing is considered an integral part of a complete examination. Limited examinations for reoccurring indications may be performed as noted.  Right Venous Findings: +---------+---------------+---------+-----------+----------+-------+          CompressibilityPhasicitySpontaneityPropertiesSummary +---------+---------------+---------+-----------+----------+-------+ CFV      Full           Yes      Yes                          +---------+---------------+---------+-----------+----------+-------+ SFJ      Full                                                 +---------+---------------+---------+-----------+----------+-------+ FV Prox  Full                                                 +---------+---------------+---------+-----------+----------+-------+ FV Mid   Full                                                  +---------+---------------+---------+-----------+----------+-------+ FV DistalFull                                                 +---------+---------------+---------+-----------+----------+-------+ PFV      Full                                                 +---------+---------------+---------+-----------+----------+-------+ POP      Full           Yes      Yes                          +---------+---------------+---------+-----------+----------+-------+ PTV      Full                                                 +---------+---------------+---------+-----------+----------+-------+ PERO     Full                                                 +---------+---------------+---------+-----------+----------+-------+  Left Venous Findings: +---+---------------+---------+-----------+----------+-------+    CompressibilityPhasicitySpontaneityPropertiesSummary +---+---------------+---------+-----------+----------+-------+ CFVFull           Yes      Yes                          +---+---------------+---------+-----------+----------+-------+    Summary: Right: There is no evidence of deep vein thrombosis in the lower extremity. Left: No evidence of common femoral vein obstruction.  *  See table(s) above for measurements and observations. Electronically signed by Deitra Mayo MD on 02/18/2018 at 7:32:36 PM.    Final     Cardiac Studies   None  Patient Profile     72 y.o. male with Stage 4b mantle cell lymphoma currently on chemo Tx with Bendamustine and Rituximab with subsequent leukopenia who was admitted with body aches, fever, hypotension and dx with MRSA bacteremia and neutropenic septic shock.  He also has HTN, type 2 DM and COPD.  He was hypoxic on admission and started on 100% nonRB and  hypotensive and given IVF resuscitation and pressors with subseuqnet worsening respiratory status and Cxray showed CHF and started on IV Lasix and BiPAP.  Also started on IV  antibx .  Went into afib with RVR with soft BP.  Initially started on IV Amio but felt that this was not having much effect and so this was stopped and he was started on IV Cardizem with resultant hypotension with SBP in the 80's and HR persisting in the 130's in afib.  Currently not on anticoagulation due to severe thrombocytopenia.    Assessment & Plan    1.   Afib with RVR -in NSR on admit and went into afib with RVR  likely related to acute illness -Started on IV amiodarone drip and converted to sinus tachycardia unfortunately converted back to atrial fibrillation with RVR. -He continues to have atrial fibrillation with RVR despite IV amio being uptitrated to 60 mg/h and multiple IV boluses. -He is also now on IV Cardizem drip at 15 mg/h as well BP is fairly stable with systolic blood pressures in the low 100s. -Suspect that ongoing A. fib with RVR related to systemic illness, metabolic derangements and ongoing sepsis and will be unable to achieve adequate rate control or maintain normal sinus rhythm until his respiratory status has improved. -Chest CT today showed new cavitary lesions in the lungs concerning for septic pulmonary emboli versus aggressive infection -Continue to bolus with IV amio and continue IV Cardizem drip. -Will rebolus with 150 mg IV amio now -Need to be aggressive with potassium repletion and keep potassium greater than 4 -Appreciate EP input -they agree that A. fib is being driven by underlying respiratory illness and should improve as respiratory status improves -no anticoagulation due to thrombocytopenia -platelet count today 48 -2D echo showed normal LV function EF 55 to 60%   2.  Severe sepsis with MRSA bacteremia -likely related to Port-A-Cath but given new findings on chest CT with new cavitary lesions in the lungs need to consider septic emboli -TEE would be helpful but given patient's respiratory status he would have to be intubated for procedure -contiue IV  Vanc and Zosyn per CCM  3.  Acute respiratory failure -related to PNA as well as acute pulmonary edema -Despite good diuresis his respiratory status is not improved and now has new cavitary lesions in his lungs consistent with infection versus septic emboli -This is likely the etiology of his persistent hypoxia -continue IV antbx  4.  Acute diastolic CHF -last echo showed normal LVF 10/2017. -likely related to volume resuscitation in setting of septic shock and afib with RVR as well as AKI -He put out 1.8 L L yesterday and is net - 1.67 L -Creatinine seems to have leveled off at 3.36 today.  It was 3.42 yesterday -BNP was only mildly elevated at 199 today -Lasix has been stopped -2D echo with normal LV function  5.   Hypokalemia/Hypomagnesemia -replete  per CCM -need to keep K+>4 and Mag > 2.  6.  Acute Kidney injury -related to septic shock -baseline Creatinine 1 but now up to 3.25>3.42 -Renal US with simple left cyst and no other abnormalities.  7.  Elevated troponin -trop mildly elevated with flat trend (0.25/0.24/0.24) and normal CPK  -this is consistent with demand ischemia in the setting of septic shock, CHF and afib with RVR -await assessment of LVF with echo  Not much else to him at this time.  The patient is critically ill and may need to consider palliative care route going forward given new chest CT findings as he does not seem to be making much headway with improvement of a respiratory standpoint.  The patient is critically ill with multiple organ systems failure and requires high complexity decision making for assessment and support, frequent evaluation and titration of therapies, application of advanced monitoring technologies and extensive interpretation of multiple databases. Critical Care Time devoted to patient care services described in this note independent of APP time is 60 minutes with >50% of time spent in direct patient care.     For questions or  updates, please contact [place] Please consult [email or web address] for contact info under Cardiology/STEMI.      Signed, Fransico Him, MD  02/20/2018, 10:18 AM

## 2018-02-20 NOTE — Progress Notes (Signed)
[facility] for Infectious Disease  Date of Admission:  02/28/2018   Total days of antibiotics 5        Day 1: vanc, zosyn        Day 3: dapto start, vanc, zos stop          Active Problems:   Sepsis ([place])   Severe sepsis ([place])   Septic shock ([place])   AKI (acute kidney injury) ([place])   Hyponatremia   Acute respiratory failure ([place])   Acute renal failure (H[facility])   Bacteremia   Atrial fibrillation with rapid ventricular response (HCC)   Elevated troponin   Acute diastolic CHF (congestive heart failure) ([place])   . docusate sodium  100 mg Oral Daily  . famotidine  20 mg Oral QPM  . feeding supplement (ENSURE ENLIVE)  237 mL Oral BID BM  . gabapentin  300 mg Oral Daily  . insulin aspart  0-9 Units Subcutaneous Q4H  . ipratropium  0.5 mg Nebulization Q6H  . levalbuterol  0.63 mg Nebulization Q6H  . mouth rinse  15 mL Mouth Rinse BID  . polyethylene glycol  17 g Oral Daily  . senna  1 tablet Oral Daily    SUBJECTIVE: Chris Lopez was examined and evaluated at bedside this AM. His respiratory function declined overnight and was put on BiPAP last night. He states he has not had any new productive sputum or cough but states he is experiencing dyspnea. He denies any F/N/V/D/C  Review of Systems: Review of Systems  Constitutional: Negative for chills, fever and malaise/fatigue.  Respiratory: Positive for shortness of breath. Negative for cough and sputum production.   Cardiovascular: Negative for chest pain and palpitations.    Past Medical History:  Diagnosis Date  . Anxiety   . Arthritis   . BPH (benign prostatic hyperplasia)   . Chronic bronchitis   . COPD (chronic obstructive pulmonary disease) ([place])   . Depression   . Diabetes mellitus without complication ([place])    Resolved; not needing medications at this time.  Marland Kitchen GERD (gastroesophageal reflux disease)   . Hypertension   . Lymphoma, mantle cell (HCC)     Social History   Tobacco Use  . Smoking status:  Current Every Day Smoker    Packs/day: 1.25    Years: 50.00    Pack years: 62.50    Types: Cigarettes  . Smokeless tobacco: Never Used  Substance Use Topics  . Alcohol use: No  . Drug use: No    Family History  Problem Relation Age of Onset  . Colon cancer Maternal Grandmother   . Colon cancer Paternal Grandmother   . Heart disease Mother   . Arthritis Mother   . Prostate cancer Father   . Heart attack Brother   . Heart disease Maternal Aunt   . Cancer Maternal Aunt   . Heart disease Maternal Uncle   . Cancer Maternal Uncle   . Heart disease Paternal Aunt   . Heart disease Paternal Uncle   . Heart disease Brother   . Heart attack Brother   . Arthritis Brother    Allergies  Allergen Reactions  . Bee Venom Anaphylaxis    Allergic to bee stings not the kit  . Feldene [Piroxicam] Rash and Dermatitis    OBJECTIVE: Vitals:   02/20/18 0751 02/20/18 0752 02/20/18 0800 02/20/18 1026  BP:   (!) 92/58   Pulse:  (!) 152 (!) 121 (!) 152  Resp:  Marland Kitchen)  27 17 (!) 22  Temp: 99.1 F (37.3 C)     TempSrc: Axillary     SpO2:  95% 93% (!) 88%  Weight:      Height:       Body mass index is 28.05 kg/m. \ Physical Exam:  Constitutional: He is oriented to person, place, and time. He appears well-developed and well-nourished. Appears uncomfortable on BiPAP.  HENT:  Head: Normocephalic and atraumatic.  Mouth/Throat: Oropharynx is clear and moist.  Eyes: Pupils are equal, round, and reactive to light. Conjunctivae and EOM are normal.  Neck: Normal range of motion. Neck supple.  Cardiovascular: Normal heart sounds and intact distal pulses.  Irregularly irregular tachycardic  Pulmonary/Chest: Effort normal. He has rales (bibasilar crackles at lung bases).  Abdominal: Soft. Bowel sounds are normal.  Musculoskeletal: Normal range of motion. He exhibits no edema.  Neurological: He is alert and oriented to person, place, and time.  Skin: Skin is warm and dry.  Chemo port site on  anterior chest wall covered with bandaging without obvious surrounding erythema, warmth, swelling, drainage, bleeding.   Lab Results Lab Results  Component Value Date   WBC 2.8 (L) 02/20/2018   HGB 10.1 (L) 02/20/2018   HCT 27.9 (L) 02/20/2018   MCV 83.3 02/20/2018   PLT 48 (L) 02/20/2018    Lab Results  Component Value Date   CREATININE 3.36 (H) 02/20/2018   BUN 123 (H) 02/20/2018   NA 140 02/20/2018   K 3.3 (L) 02/20/2018   CL 103 02/20/2018   CO2 23 02/20/2018    Lab Results  Component Value Date   ALT 24 03/05/2018   AST 40 03/01/2018   ALKPHOS 71 02/12/2018   BILITOT 1.2 02/27/2018     Microbiology: Recent Results (from the past 240 hour(s))  Culture, blood (Routine x 2)     Status: Abnormal   Collection Time: 02/18/2018 12:51 PM  Result Value Ref Range Status   Specimen Description BLOOD LEFT ANTECUBITAL  Final   Special Requests   Final    BOTTLES DRAWN AEROBIC AND ANAEROBIC Blood Culture adequate volume   Culture  Setup Time   Final    GRAM POSITIVE COCCI IN BOTH AEROBIC AND ANAEROBIC BOTTLES Gram Stain Report Called to,Read Back By and Verified With: BROWN,KYLIE ([place]) _0  BY MATTHEWS, B 11.10.19 [facility] CRITICAL RESULT CALLED TO, READ BACK BY AND VERIFIED WITH: PHARMD RACHEL R [phone] [id number] FCP Performed at [facility], [place] [address] [place], [place] [address]    Culture METHICILLIN RESISTANT STAPHYLOCOCCUS AUREUS (A)  Final   Report Status 02/19/2018 FINAL  Final   Organism ID, Bacteria METHICILLIN RESISTANT STAPHYLOCOCCUS AUREUS  Final      Susceptibility   Methicillin resistant staphylococcus aureus - MIC*    CIPROFLOXACIN >=8 RESISTANT Resistant     ERYTHROMYCIN >=8 RESISTANT Resistant     GENTAMICIN <=0.5 SENSITIVE Sensitive     OXACILLIN >=4 RESISTANT Resistant     TETRACYCLINE <=1 SENSITIVE Sensitive     VANCOMYCIN <=0.5 SENSITIVE Sensitive     TRIMETH/SULFA <=10 SENSITIVE Sensitive     CLINDAMYCIN <=0.25 SENSITIVE  Sensitive     RIFAMPIN <=0.5 SENSITIVE Sensitive     Inducible Clindamycin NEGATIVE Sensitive     * METHICILLIN RESISTANT STAPHYLOCOCCUS AUREUS  Blood Culture ID Panel (Reflexed)     Status: Abnormal   Collection Time: 02/23/2018 12:51 PM  Result Value Ref Range Status   Enterococcus species NOT DETECTED NOT DETECTED Final   Listeria  monocytogenes NOT DETECTED NOT DETECTED Final   Staphylococcus species DETECTED (A) NOT DETECTED Final    Comment: CRITICAL RESULT CALLED TO, READ BACK BY AND VERIFIED WITH: PHARMD RACHEL R [id number] FCP    Staphylococcus aureus (BCID) DETECTED (A) NOT DETECTED Final    Comment: Methicillin (oxacillin)-resistant Staphylococcus aureus (MRSA). MRSA is predictably resistant to beta-lactam antibiotics (except ceftaroline). Preferred therapy is vancomycin unless clinically contraindicated. Patient requires contact precautions if  hospitalized. CRITICAL RESULT CALLED TO, READ BACK BY AND VERIFIED WITH: PHARMD RACHEL R [id number] F[facility]    Methicillin resistance DETECTED (A) NOT DETECTED Final    Comment: CRITICAL RESULT CALLED TO, READ BACK BY AND VERIFIED WITH: PHARMD RACHEL R [id number] [facility]    Streptococcus species NOT DETECTED NOT DETECTED Final   Streptococcus agalactiae NOT DETECTED NOT DETECTED Final   Streptococcus pneumoniae NOT DETECTED NOT DETECTED Final   Streptococcus pyogenes NOT DETECTED NOT DETECTED Final   Acinetobacter baumannii NOT DETECTED NOT DETECTED Final   Enterobacteriaceae species NOT DETECTED NOT DETECTED Final   Enterobacter cloacae complex NOT DETECTED NOT DETECTED Final   Escherichia coli NOT DETECTED NOT DETECTED Final   Klebsiella oxytoca NOT DETECTED NOT DETECTED Final   Klebsiella pneumoniae NOT DETECTED NOT DETECTED Final   Proteus species NOT DETECTED NOT DETECTED Final   Serratia marcescens NOT DETECTED NOT DETECTED Final   Haemophilus influenzae NOT DETECTED NOT DETECTED Final   Neisseria meningitidis NOT DETECTED NOT  DETECTED Final   Pseudomonas aeruginosa NOT DETECTED NOT DETECTED Final   Candida albicans NOT DETECTED NOT DETECTED Final   Candida glabrata NOT DETECTED NOT DETECTED Final   Candida krusei NOT DETECTED NOT DETECTED Final   Candida parapsilosis NOT DETECTED NOT DETECTED Final   Candida tropicalis NOT DETECTED NOT DETECTED Final    Comment: Performed at [facility], [place]. [address] [place], [place] [address]  Culture, blood (Routine x 2)     Status: Abnormal   Collection Time: 02/08/2018  1:08 PM  Result Value Ref Range Status   Specimen Description   Final    BLOOD LEFT HAND BOTTLES DRAWN AEROBIC ONLY Performed at [facility], [address], [place], [place] [address]    Special Requests   Final    Blood Culture results may not be optimal due to an inadequate volume of blood received in culture bottles Performed at [facility], [address] [place], [place] [address]    Culture  Setup Time   Final    GRAM POSITIVE COCCI OTHER SET PREVIOUSLY CALLED 02/16/18 SAME MORPHOLOGY CRITICAL VALUE NOTED.  VALUE IS CONSISTENT WITH PREVIOUSLY REPORTED AND CALLED VALUE.    Culture (A)  Final    STAPHYLOCOCCUS AUREUS SUSCEPTIBILITIES PERFORMED ON PREVIOUS CULTURE WITHIN THE LAST 5 DAYS. Performed at [facility], [place] [address] [place] [address], [place], [place] [address]    Report Status 02/20/2018 FINAL  Final  Urine Culture     Status: None   Collection Time: 02/16/18 11:24 AM  Result Value Ref Range Status   Specimen Description URINE, RANDOM  Final   Special Requests NONE  Final   Culture   Final    NO GROWTH Performed at [facility], [place] [address] [place], [place] [address]    Report Status 02/17/2018 FINAL  Final  Culture, blood (Routine X 2) w Reflex to ID Panel     Status: None (Preliminary result)   Collection Time: 02/16/18 11:35 AM  Result Value Ref Range Status  Specimen Description BLOOD RIGHT HAND  Final   Special Requests   Final    BOTTLES DRAWN AEROBIC ONLY  Blood Culture adequate volume   Culture   Final    NO GROWTH 4 DAYS Performed at [facility], [address] [place], [place] [address]    Report Status PENDING  Incomplete  Culture, blood (Routine X 2) w Reflex to ID Panel     Status: None (Preliminary result)   Collection Time: 02/16/18 11:40 AM  Result Value Ref Range Status   Specimen Description BLOOD RIGHT HAND  Final   Special Requests   Final    BOTTLES DRAWN AEROBIC ONLY Blood Culture adequate volume   Culture   Final    NO GROWTH 4 DAYS Performed at [facility], [place] [address] [place], [place] [address]    Report Status PENDING  Incomplete  Respiratory Panel by PCR     Status: None   Collection Time: 02/16/18 11:46 AM  Result Value Ref Range Status   Adenovirus NOT DETECTED NOT DETECTED Final   Coronavirus 229E NOT DETECTED NOT DETECTED Final   Coronavirus HKU1 NOT DETECTED NOT DETECTED Final   Coronavirus NL63 NOT DETECTED NOT DETECTED Final   Coronavirus OC43 NOT DETECTED NOT DETECTED Final   Metapneumovirus NOT DETECTED NOT DETECTED Final   Rhinovirus / Enterovirus NOT DETECTED NOT DETECTED Final   Influenza A NOT DETECTED NOT DETECTED Final   Influenza B NOT DETECTED NOT DETECTED Final   Parainfluenza Virus 1 NOT DETECTED NOT DETECTED Final   Parainfluenza Virus 2 NOT DETECTED NOT DETECTED Final   Parainfluenza Virus 3 NOT DETECTED NOT DETECTED Final   Parainfluenza Virus 4 NOT DETECTED NOT DETECTED Final   Respiratory Syncytial Virus NOT DETECTED NOT DETECTED Final   Bordetella pertussis NOT DETECTED NOT DETECTED Final   Chlamydophila pneumoniae NOT DETECTED NOT DETECTED Final   Mycoplasma pneumoniae NOT DETECTED NOT DETECTED Final    Comment: Performed at [facility], [place] [address] [place], [place] [address]  MRSA PCR Screening     Status: None   Collection Time: 02/17/18 10:56 AM  Result Value Ref Range Status   MRSA by PCR NEGATIVE NEGATIVE Final    Comment:        The GeneXpert  MRSA Assay (FDA approved for NASAL specimens only), is one component of a comprehensive MRSA colonization surveillance program. It is not intended to diagnose MRSA infection nor to guide or monitor treatment for MRSA infections. Performed at [facility], [place] [address] [place], [place] [address]      ASSESSMENT: Chris Lopez is a 72 yo M w/ stage iv mantle cell lymphoma admit for MRSA bacteremia. Yesterday had worsening respiratory function and chest CT showed septic pulmonary emboli. EP was consulted to better control his heart rate but he continues to endorse tachycardia despite dilt drip therapy. It is imperative that we get a TEE to evaluate his valves but currently quite unstable and he would not be able to tolerate. Recommend adding on doxycycline for the pulmonary infection as dapto is deactivated by lung surfactant.   PLAN: 1. MRSA bacteremia - F/u repeat blood cultures (no growth for 4 days) - C/w dapto - Start doxycycline for pulmonary penetration - TEE once heart rate better controlled (currently 140s)   Mosetta Anis, MD, PGY1 02/20/2018, 10:32 AM

## 2018-02-20 NOTE — Progress Notes (Signed)
RT placed pt on servo in BIPAP mode 12/6 with FIO2 80% d/t pt SpO2 at 88%. RT continued to monitor pt SpO2 with some improvement but not at sat goal of >92%. RT increased pt FIO2 to 100% and pt SpO2 increased to 93%. RN tried to put pt back on NRB 15LPM (100%) to give pt a break and pt began to desat very quickly down to SpO2 of 82%. Pt was placed back on BIPAP to regain sat goal. RT will continue to monitor.

## 2018-02-20 NOTE — Telephone Encounter (Signed)
Call received from patient's wife via voice message; states patient had been unable to call to cancel his appointment 02/18/18, due to being at [facility]. Patient to call when able to re-schedule.

## 2018-02-21 ENCOUNTER — Inpatient Hospital Stay (HOSPITAL_COMMUNITY): Payer: Medicare HMO

## 2018-02-21 LAB — RENAL FUNCTION PANEL
Albumin: 1.7 g/dL — ABNORMAL LOW (ref 3.5–5.0)
Anion gap: 17 — ABNORMAL HIGH (ref 5–15)
BUN: 147 mg/dL — ABNORMAL HIGH (ref 8–23)
CALCIUM: 8.3 mg/dL — AB (ref 8.9–10.3)
CHLORIDE: 107 mmol/L (ref 98–111)
CO2: 17 mmol/L — AB (ref 22–32)
Creatinine, Ser: 3.82 mg/dL — ABNORMAL HIGH (ref 0.61–1.24)
GFR calc Af Amer: 17 mL/min — ABNORMAL LOW (ref >60)
GFR, EST NON AFRICAN AMERICAN: 15 mL/min — AB (ref >60)
Glucose, Bld: 149 mg/dL — ABNORMAL HIGH (ref 70–99)
PHOSPHORUS: 7.2 mg/dL — AB (ref 2.5–4.6)
POTASSIUM: 4.7 mmol/L (ref 3.5–5.1)
Sodium: 141 mmol/L (ref 135–145)

## 2018-02-21 LAB — CULTURE, BLOOD (ROUTINE X 2)
CULTURE: NO GROWTH
CULTURE: NO GROWTH
SPECIAL REQUESTS: ADEQUATE
Special Requests: ADEQUATE

## 2018-02-21 LAB — CBC WITH DIFFERENTIAL/PLATELET
BAND NEUTROPHILS: 12 %
BASOS PCT: 0 %
Band Neutrophils: 5 %
Basophils Absolute: 0 10*3/uL (ref 0.0–0.1)
Basophils Absolute: 0 10*3/uL (ref 0.0–0.1)
Basophils Relative: 0 %
EOS ABS: 0 10*3/uL (ref 0.0–0.5)
EOS PCT: 1 %
Eosinophils Absolute: 0 10*3/uL (ref 0.0–0.5)
Eosinophils Relative: 0 %
HEMATOCRIT: 30.9 % — AB (ref 39.0–52.0)
HEMATOCRIT: 28.1 % — AB (ref 39.0–52.0)
Hemoglobin: 10.6 g/dL — ABNORMAL LOW (ref 13.0–17.0)
Hemoglobin: 9.8 g/dL — ABNORMAL LOW (ref 13.0–17.0)
LYMPHS PCT: 0 %
Lymphocytes Relative: 3 %
Lymphs Abs: 0.1 10*3/uL — ABNORMAL LOW (ref 0.7–4.0)
Lymphs Abs: 0 10*3/uL — ABNORMAL LOW (ref 0.7–4.0)
MCH: 29.2 pg (ref 26.0–34.0)
MCH: 29.7 pg (ref 26.0–34.0)
MCHC: 34.3 g/dL (ref 30.0–36.0)
MCHC: 34.9 g/dL (ref 30.0–36.0)
MCV: 85.1 fL (ref 80.0–100.0)
MCV: 85.2 fL (ref 80.0–100.0)
METAMYELOCYTES PCT: 2 %
MONO ABS: 0.4 10*3/uL (ref 0.1–1.0)
MONOS PCT: 14 %
MONOS PCT: 5 %
Monocytes Absolute: 0.2 10*3/uL (ref 0.1–1.0)
NEUTROS ABS: 3.9 10*3/uL (ref 1.7–7.7)
NRBC: 0 % (ref 0.0–0.2)
Neutro Abs: 2.2 10*3/uL (ref 1.7–7.7)
Neutrophils Relative %: 77 %
Neutrophils Relative %: 81 %
Platelets: 50 10*3/uL — ABNORMAL LOW (ref 150–400)
Platelets: 49 10*3/uL — ABNORMAL LOW (ref 150–400)
RBC: 3.63 MIL/uL — AB (ref 4.22–5.81)
RBC: 3.3 MIL/uL — ABNORMAL LOW (ref 4.22–5.81)
RDW: 17.6 % — ABNORMAL HIGH (ref 11.5–15.5)
RDW: 17.9 % — AB (ref 11.5–15.5)
WBC: 2.7 10*3/uL — AB (ref 4.0–10.5)
WBC: 4.1 10*3/uL (ref 4.0–10.5)
nRBC: 0 /100 WBC
nRBC: 0 % (ref 0.0–0.2)

## 2018-02-21 LAB — GLUCOSE, CAPILLARY
GLUCOSE-CAPILLARY: 120 mg/dL — AB (ref 70–99)
GLUCOSE-CAPILLARY: 123 mg/dL — AB (ref 70–99)
GLUCOSE-CAPILLARY: 144 mg/dL — AB (ref 70–99)
Glucose-Capillary: 141 mg/dL — ABNORMAL HIGH (ref 70–99)
Glucose-Capillary: 154 mg/dL — ABNORMAL HIGH (ref 70–99)
Glucose-Capillary: 131 mg/dL — ABNORMAL HIGH (ref 70–99)

## 2018-02-21 LAB — MAGNESIUM: MAGNESIUM: 2.2 mg/dL (ref 1.7–2.4)

## 2018-02-21 MED ORDER — METHYLPREDNISOLONE SODIUM SUCC 125 MG IJ SOLR
60.0000 mg | Freq: Four times a day (QID) | INTRAMUSCULAR | Status: DC
  Administered 2018-02-21 – 2018-02-23 (×8): 60 mg via INTRAVENOUS
  Filled 2018-02-21 (×8): qty 2

## 2018-02-21 MED ORDER — INSULIN ASPART 100 UNIT/ML ~~LOC~~ SOLN
0.0000 [IU] | SUBCUTANEOUS | Status: DC
  Administered 2018-02-21 (×2): 3 [IU] via SUBCUTANEOUS
  Administered 2018-02-22 (×2): 4 [IU] via SUBCUTANEOUS
  Administered 2018-02-22: 3 [IU] via SUBCUTANEOUS
  Administered 2018-02-22 (×3): 4 [IU] via SUBCUTANEOUS
  Administered 2018-02-23: 7 [IU] via SUBCUTANEOUS
  Administered 2018-02-23 (×3): 4 [IU] via SUBCUTANEOUS
  Administered 2018-02-23 (×3): 7 [IU] via SUBCUTANEOUS
  Administered 2018-02-24 (×2): 4 [IU] via SUBCUTANEOUS

## 2018-02-21 MED ORDER — GLYCERIN (LAXATIVE) 2.1 G RE SUPP
1.0000 | Freq: Every day | RECTAL | Status: DC | PRN
  Administered 2018-02-21 – 2018-02-22 (×3): 1 via RECTAL
  Filled 2018-02-21 (×4): qty 1

## 2018-02-21 MED ORDER — FAMOTIDINE IN NACL 20-0.9 MG/50ML-% IV SOLN
20.0000 mg | INTRAVENOUS | Status: DC
  Administered 2018-02-22 – 2018-02-24 (×3): 20 mg via INTRAVENOUS
  Filled 2018-02-21 (×4): qty 50

## 2018-02-21 MED ORDER — ORAL CARE MOUTH RINSE
15.0000 mL | Freq: Two times a day (BID) | OROMUCOSAL | Status: DC
  Administered 2018-02-21 – 2018-02-25 (×10): 15 mL via OROMUCOSAL

## 2018-02-21 MED ORDER — DOCUSATE SODIUM 100 MG PO CAPS
100.0000 mg | ORAL_CAPSULE | Freq: Every day | ORAL | Status: DC | PRN
  Filled 2018-02-21: qty 1

## 2018-02-21 MED ORDER — CHLORHEXIDINE GLUCONATE 0.12 % MT SOLN
15.0000 mL | Freq: Two times a day (BID) | OROMUCOSAL | Status: DC
  Administered 2018-02-21 – 2018-02-25 (×9): 15 mL via OROMUCOSAL
  Filled 2018-02-21 (×2): qty 15

## 2018-02-21 NOTE — Progress Notes (Addendum)
Late entry: RT transported pt to CT and back on BIPAP without any complications. RT will continue to monitor.

## 2018-02-21 NOTE — Progress Notes (Signed)
NAME:  Chris Lopez, MRN:  [id number], DOB:  1946-01-27, LOS: 0 ADMISSION DATE:  02/07/2018, CONSULTATION DATE: 03/02/2018 REFERRING MD: [facility], CHIEF COMPLAINT: Body aches  Brief History   71 yo male with Stage IVb Mantle cell lymphoma on chemotherapy 2 weeks prior to admission (bendamustine, rituximab) presented with generalized body aches, hypotension from sepsis related to MRSA bacteremia complicated by pneumonia with septic emboli, acute hypoxic respiratory failure, a fib with RVR and acute renal failure.  Past Medical History  BPH, COPD, Depression, DM, GERD, HTN  Significant Hospital Events   11/09 admit from [facility] ED 11/10 DNR 11/11 start IV amiodarone 11/12 removal of Rt chest port 11/13 add diltiazem IV 11/15 add trial of solumedrol for possible chemo pulmonary toxicity  Consults:  11/11 ID >> MRSA bacteremia 11/11 IR >> port removal 11/11 Cardiology >> a fib with RVR, elevated troponin, acute diastolic CHF 00/93 EP cardiology >> a fib  Procedures (surgical and bedside):  Rt chemo port >> 11/12  Significant Diagnostic Tests:  Doppler Rt leg 11/12 >> no DVT Echo 11/12 >> EF 55 to 60%, no vegetations noted HRCT chest 11/13 >> atherosclerosis, diffuse GGO b/l more at bases, consolidation b/l lower lobes Lt > Rt, scattered cavitary nodules, mild air trapping, mild centrilobular and paraseptal emphysema  Micro Data:  Blood 11/09 >> MRSA Respiratory viral panel 11/10 >> negative Urine 11/10 >> negative Blood 11/10 >> negative  Antimicrobials:  11/09 Zosyn> 11/10 11/09 Vancomycin> 11/10 11/11 Daptomycin >  11/14 Doxycycline >  Subjective:  Unable to tolerate transition off Bipap. Remains on 100% FiO2.  Remains tachycardic.    Objective   Blood pressure (!) 105/52, pulse (!) 141, temperature 97.6 F (36.4 C), temperature source Axillary, resp. rate (!) 25, height 6' (1.829 m), weight 94.3 kg, SpO2 (!) 88 %.    FiO2 (%):  [100 %] 100 %    Intake/Output Summary (Last 24 hours) at 02/21/2018 1015 Last data filed at 02/21/2018 0800 Gross per 24 hour  Intake 2949.74 ml  Output 830 ml  Net 2119.74 ml    Examination:  General - somnolent, using accessory muscle, abdominal breathing Eyes - pupils reactive ENT - bipap mask on Cardiac - irregular, no murmur Chest - poor air movement, b/l crackles Abdomen - soft, non tender, decreased bowel sounds Extremities - cool, 1+ edema Skin - no rashes Neuro - opens eyes with stimulation, follows simple commands  CXR 11/15 (reviewed by me) >> no change in b/l ASD   Assessment & Plan:   Acute hypoxic respiratory failure from HCAP with septic emboli. ? Pulmonary toxicity from chemotherapy. Hx of COPD. - DNR/ DNI - continue Bipap for now - goal SpO2 88 to 95% - f/u CXR - scheduled BDs - add trial of solumedrol 11/15  MRSA bacteremia and septic emboli. - port removed 11/12 - too unstable for TEE - continue daptomycin, doxycycline per ID  A fib with RVR. - amiodarone, cardizem per ID - no anticoagulation in setting of thrombocytopenia  Mantle cell lymphoma with chemo induced pancytopenia. - f/u CBC  Acute renal failure with ATN. Metabolic acidosis. - baseline creatinine 1.07 from 01/28/18 - f/u BMET  Steroid induced hyperglycemia. - SSI  Moderate protein calorie malnutrition. - unable to tolerate PO intake while on Bipap   Best practice:  Diet: NPO DVT prophylaxis: SCDs.  GI prophylaxis: pepcid Mobility: bedrest Code Status: DNR Family Communication: updated pt's wife at bedside  CC time 33 minutes  Kadejah Sandiford,  MD [place] 02/21/2018, 10:45 AM

## 2018-02-21 NOTE — Progress Notes (Signed)
Initial Nutrition Assessment  DOCUMENTATION CODES:   Non-severe (moderate) malnutrition in context of chronic illness  INTERVENTION:  -Recommend Ensure Enlive PRN if pt able to take BiPAP off and eat    NUTRITION DIAGNOSIS:   Moderate Malnutrition related to chronic illness(mantle cell lymphoma w/ chemo, COPD) as evidenced by mild fat depletion, moderate fat depletion, moderate muscle depletion.  GOAL:   Patient will meet greater than or equal to 90% of their needs  MONITOR:   PO intake, Supplement acceptance, Weight trends, Labs  REASON FOR ASSESSMENT:   Low Braden    ASSESSMENT:   Mr. Medlen is a 72 yo male with PMH of BPH, DM, depression, anxiety, COPD, chronic bronchitis, GERD, HTN, mantle cell lymphoma undergoing chemo, admitted for sepsis.   Per CCM note pt unable to tolerate transition off bipap and unable to tolerate PO intake. Pt currently NPO. Last intake 25% meal completion of full liquid diet 11/13.   Spoke with nurse who reports prognosis is poor. No plans for intubation at this time. Notes indicate recommendations for palliative care consult.   Visited pt at bedside, he is resting with BiPAP on.  Completed physical exam, findings are mild-moderate fat depletion, moderate muscle depletions, meeting criteria for moderate malnutrition.  Spoke with wife in room who says last time pt had BiPAP off and ate was 2 days ago. Last BM was 6 days ago 11/9. Suppositories being given with no success.   Recommend Ensure Enlive PRN if pt is able to remove BiPAP and eat; however from speaking to RN seems unlikely.   Meds: novolog ss, glycerin suppository,  Labs: CBGs 122-154, phosphorus 7.2   NUTRITION - FOCUSED PHYSICAL EXAM:    Most Recent Value  Orbital Region  Unable to assess  Upper Arm Region  Moderate depletion  Thoracic and Lumbar Region  Mild depletion  Buccal Region  Unable to assess  Temple Region  Mild depletion  Clavicle Bone Region  Moderate depletion   Clavicle and Acromion Bone Region  Moderate depletion  Scapular Bone Region  Moderate depletion  Dorsal Hand  Mild depletion  Patellar Region  Moderate depletion  Anterior Thigh Region  Moderate depletion  Posterior Calf Region  Moderate depletion  Hair  Reviewed  Eyes  Unable to assess  Mouth  Unable to assess  Skin  Reviewed  Nails  Reviewed      Diet Order:   Diet Order            Diet NPO time specified Except for: Ice Chips  Diet effective now              EDUCATION NEEDS:   No education needs have been identified at this time  Skin:  Skin Assessment: Reviewed RN Assessment  Last BM:  11/9  Height:   Ht Readings from Last 1 Encounters:  03/02/2018 6' (1.829 m)    Weight:   Wt Readings from Last 1 Encounters:  02/21/18 94.3 kg    Ideal Body Weight:  80.9 kg  BMI:  Body mass index is 28.2 kg/m.  Estimated Nutritional Needs:   Kcal:  2100-2400  Protein:  105-120 gm  Fluid:  >/=2.1 L    Youssouf Shipley, Dietetic Intern [phone]

## 2018-02-21 NOTE — Progress Notes (Signed)
Progress Note  Patient Name: Chris Lopez Date of Encounter: 02/21/2018  Primary Cardiologist: No primary care provider on file.   Subjective   Somnolent this morning but does not appear to be in distress. O2 sats 92% on BiPAP. HR 110s-120s on exam.   Inpatient Medications    Scheduled Meds: . chlorhexidine  15 mL Mouth Rinse BID  . insulin aspart  0-9 Units Subcutaneous Q4H  . ipratropium  0.5 mg Nebulization Q6H  . levalbuterol  0.63 mg Nebulization Q6H  . mouth rinse  15 mL Mouth Rinse q12n4p   Continuous Infusions: . sodium chloride 50 mL/hr at 02/21/18 0800  . amiodarone 60 mg/hr (02/21/18 0800)  . DAPTOmycin (CUBICIN)  IV Stopped (02/19/18 2032)  . diltiazem (CARDIZEM) infusion 15 mg/hr (02/21/18 0800)  . doxycycline (VIBRAMYCIN) IV Stopped (02/21/18 1607)  . famotidine (PEPCID) IV 20 mg (02/21/18 0909)   PRN Meds: acetaminophen, acetaminophen, fentaNYL (SUBLIMAZE) injection, Glycerin (Adult), lidocaine-EPINEPHrine, ondansetron (ZOFRAN) IV   Vital Signs    Vitals:   02/21/18 0743 02/21/18 0745 02/21/18 0800 02/21/18 0803  BP:   (!) 105/52   Pulse: (!) 111 (!) 123 (!) 141   Resp: (!) 26 (!) 23 (!) 25   Temp:    97.6 F (36.4 C)  TempSrc:    Axillary  SpO2: 91% 90% (!) 88%   Weight:      Height:        Intake/Output Summary (Last 24 hours) at 02/21/2018 0957 Last data filed at 02/21/2018 0800 Gross per 24 hour  Intake 2996.1 ml  Output 880 ml  Net 2116.1 ml   Filed Weights   02/19/18 0200 02/20/18 0500 02/21/18 0435  Weight: 94 kg 93.8 kg 94.3 kg    Telemetry    Persistent atrial fibrillation with RVR - rate generally in the 110s-120s - Personally Reviewed  Physical Exam   GEN: Somnolent, ill appearing gentleman laying in bed in no acute distress. BiPAP mask in place Neck: No JVD, no carotid bruits Cardiac: IRIR, no murmurs, rubs, or gallops.  Respiratory: diffuse rhonchi anteriorly GI: quiet bowel sounds, Soft, no grimacing to palpation,  non-distended  MS: No edema; No deformity. Neuro:  Somnolent - did not wake up for exam Psych: Unable to assess  Labs    Chemistry Recent Labs  Lab 02/18/2018 1253  02/19/18 0441 02/20/18 0349 02/21/18 0301  NA 124*   < > 138 140 141  K 4.2   < > 3.2* 3.3* 4.7  CL 92*   < > 103 103 107  CO2 20*   < > 22 23 17*  GLUCOSE 90   < > 141* 164* 149*  BUN 55*   < > 102* 123* 147*  CREATININE 3.56*   < > 3.42* 3.36* 3.82*  CALCIUM 7.2*   < > 8.4* 8.2* 8.3*  PROT 5.4*  --   --   --   --   ALBUMIN 2.6*  --   --   --  1.7*  AST 40  --   --   --   --   ALT 24  --   --   --   --   ALKPHOS 71  --   --   --   --   BILITOT 1.2  --   --   --   --   GFRNONAA 16*   < > 17* 17* 15*  GFRAA 18*   < > 19* 20* 17*  ANIONGAP 12   < >  13 14 17*   < > = values in this interval not displayed.     Hematology Recent Labs  Lab 02/19/18 0441 02/20/18 0349 02/21/18 0301  WBC 2.5* 2.8* 2.7*  RBC 3.22* 3.35* 3.63*  HGB 9.8* 10.1* 10.6*  HCT 26.8* 27.9* 30.9*  MCV 83.2 83.3 85.1  MCH 30.4 30.1 29.2  MCHC 36.6* 36.2* 34.3  RDW 16.8* 17.0* 17.6*  PLT 40* 48* 50*    Cardiac Enzymes Recent Labs  Lab 02/16/18 1123 02/16/18 1834 02/17/18 0142  TROPONINI 0.25* 0.24* 0.24*   No results for input(s): TROPIPOC in the last 168 hours.   BNP Recent Labs  Lab 02/18/18 0443  BNP 199.0*     DDimer No results for input(s): DDIMER in the last 168 hours.   Radiology    Dg Chest 1 View  Result Date: 02/21/2018 CLINICAL DATA:  Shortness of Breath EXAM: CHEST  1 VIEW COMPARISON:  February 19, 2018 chest radiograph and chest CT; June 29, 2010 FINDINGS: There is interstitial prominence in the mid and lower lung zones, likely interstitial edema. There are small pleural effusions bilaterally. There is bibasilar atelectasis. There is mild consolidation in the medial left base. Heart is mildly enlarged with mild pulmonary venous hypertension. There is aortic atherosclerosis. No adenopathy. There is  postoperative change in the lower cervical spine. IMPRESSION: There is a degree of pulmonary vascular congestion with areas of apparent interstitial pulmonary edema bilaterally, essentially stable. Mild consolidation medial left base is likely due to atelectasis with questionable superimposed pneumonia. More patchy atelectasis is also noted in each lung base with small pleural effusions bilaterally. Stable cardiac silhouette. There is aortic atherosclerosis. Aortic Atherosclerosis (ICD10-I70.0). Electronically Signed   By: Lowella Grip III M.D.   On: 02/21/2018 07:42   Dg Chest 1 View  Result Date: 02/19/2018 CLINICAL DATA:  72 year old male with mantle cell lymphoma undergoing chemotherapy. Neutropenic septic shock, MRSA bacteremia. CHF. Atrial fibrillation with RVR. EXAM: CHEST  1 VIEW COMPARISON:  02/18/2018 and earlier. FINDINGS: Portable AP semi upright view at 1022 hours. Continued low lung volumes. Right chest Port-A-Cath has been removed. Mediastinal contours remain normal. Regressed but not resolved bilateral pulmonary interstitial opacity since 02/16/2018. No pneumothorax or pleural effusion. Visualized tracheal air column is within normal limits. Partially visible ACDF. Stable visible bowel gas pattern. IMPRESSION: 1. Low lung volumes with regressed but not resolved bilateral pulmonary interstitial opacity. Edema is favored over disseminated respiratory infection. 2. No new cardiopulmonary abnormality. Electronically Signed   By: Genevie Ann M.D.   On: 02/19/2018 10:41   Ct Chest High Resolution  Result Date: 02/20/2018 CLINICAL DATA:  72 year old male on BiPAP. Evaluate for interstitial lung disease. EXAM: CT CHEST WITHOUT CONTRAST TECHNIQUE: Multidetector CT imaging of the chest was performed following the standard protocol without intravenous contrast. High resolution imaging of the lungs, as well as inspiratory and expiratory imaging, was performed. COMPARISON:  PET-CT 01/20/2018. FINDINGS:  Cardiovascular: Heart size is mildly enlarged. There is no significant pericardial fluid, thickening or pericardial calcification. There is aortic atherosclerosis, as well as atherosclerosis of the great vessels of the mediastinum and the coronary arteries, including calcified atherosclerotic plaque in the left main, left anterior descending, left circumflex and right coronary arteries. Mediastinum/Nodes: No pathologically enlarged mediastinal or hilar lymph nodes. Please note that accurate exclusion of hilar adenopathy is limited on noncontrast CT scans. Esophagus is unremarkable in appearance. No axillary lymphadenopathy. Lungs/Pleura: Today's study is limited by patient respiratory motion. With these limitations in mind, there  are some areas of relatively diffuse ground-glass attenuation scattered throughout the lungs bilaterally, most evident throughout the mid to lower lungs, new compared to the recent prior study from 01/20/2018. New areas of airspace consolidation are also noted in the lower lobes of the lungs bilaterally (left greater than right), where there also appears to be substantial passive subsegmental atelectasis. A few scattered cavitary nodules are also noted on today's examination, new compared to the prior study, largest of which is in the anterior aspect of the left upper lobe (axial image 85 of series 5) measuring 2.2 x 2.1 cm in diameter, presumably infectious or inflammatory in etiology. No significant regions of traction bronchiectasis or honeycombing are identified. Inspiratory and expiratory imaging demonstrates mild air trapping indicative of small airways disease. Trace bilateral pleural effusions lying dependently. Diffuse bronchial wall thickening with mild centrilobular and paraseptal emphysema. Upper Abdomen: There are a few scattered calcified granulomas throughout the visualized liver and spleen. Aortic atherosclerosis. Musculoskeletal: Orthopedic fixation hardware in the lower  cervical spine incidentally noted. There are no aggressive appearing lytic or blastic lesions noted in the visualized portions of the skeleton. IMPRESSION: 1. Although there are some limitations of today's examination, there are no findings on today's study to strongly suggest interstitial lung disease. Additionally, prior PET-CT 01/20/2018 demonstrated no suspicious findings. 2. Today's study does demonstrate new multifocal cavitary pulmonary nodules, suspicious for potential infectious or inflammatory etiology, including septic embolism. There also appears to be areas of more confluent airspace consolidation in the lower lobes of the lungs bilaterally, which are associated with some significant atelectasis as well. 3. Trace bilateral pleural effusions. 4. Mild cardiomegaly with areas of ground-glass attenuation throughout the mid to lower lungs bilaterally, favored to reflect a background of mild interstitial pulmonary edema. 5. Aortic atherosclerosis, in addition to left main and 3 vessel coronary artery disease. Assessment for potential risk factor modification, dietary therapy or pharmacologic therapy may be warranted, if clinically indicated. 6. Mild diffuse bronchial wall thickening with mild centrilobular and paraseptal emphysema; imaging findings suggestive of underlying COPD. Aortic Atherosclerosis (ICD10-I70.0) and Emphysema (ICD10-J43.9). Electronically Signed   By: Vinnie Langton M.D.   On: 02/20/2018 08:17    Cardiac Studies   Echocardiogram 02/2018: Study Conclusions  - Left ventricle: The cavity size was normal. Wall thickness was   normal. Systolic function was normal. The estimated ejection   fraction was in the range of 55% to 60%.  Impressions:  - There was no evidence of a vegetation. There is extreme   tachycardia during the study, limiting the accuracy of the data.   Consider repeat echo when rate is controlled.  Recommendations:  Consider transesophageal  echocardiography if clinically indicated for high clinical suspicion for endocarditis.  Patient Profile        72 y.o. male with Stage 4b mantle cell lymphoma currently on chemo Tx with Bendamustine and Rituximab with subsequent leukopenia who was admitted with body aches, fever, hypotension and dx with MRSA bacteremia and neutropenic septic shock. He also has HTN, type 2 DM and COPD. Cardiology following for atrial fibrillation with RVR  Assessment & Plan    1. Atrial fibrillation with RVR: rate generally in the 110s-120s this morning - continues to be difficult to control despite IV amiodarone and Diltiazem gtt. Seen by EP 11/13 and felt that afib was primarily driven by underlying respiratory illness and should improve as his respiratory status improved. Anticoagulation not initiated due to thrombocytopenia - Continue IV amiodarone and diltiazem gtt - Continue  aggressive electrolyte repletion - K 4.7 (goal >4) and Mg 2.2 (goal >2) today  2. MRSA bacteremia/sepsis: has port-a-cath for chemotherapy for lymphoma. Also with CT Chest c/f septic emboli. ID following and do not feel TEE is indicated as he will need a prolonged course of IV antibiotics regardless of findings.  - Continue IV antibiotics per primary team/ ID  3. Acute respiratory failure: felt to be 2/2 PNA and septic emboli on CT Chest. No change in status despite adequate diuresis. Still requiring Bipap at this time - Continue IV antibiotics per primary team/ID  4. Acute diastolic CHF: Echo with EF 55-60%. Volume overload felt to be 2/2 fluid resuscitation in the setting of septic shock and Afib RVR. He was diuresed with IV lasix, stopped once euvolemia achieved. Cr up to 3.8 today.  - Continue to hold diuresis at this time  5. AKI: 2/2 septic shock. Renal US without significant findings. Cr up to 3.8 today; baseline 1.0.  - Continue management per primary team  Consider palliative care consult.  For questions or updates,  please contact [place] Please consult [email or web address] for contact info under Cardiology/STEMI.      Signed, Abigail Butts, PA-C  02/21/2018, 9:57 AM   [phone]

## 2018-02-21 NOTE — Progress Notes (Signed)
[facility] for Infectious Disease   Reason for visit: Follow up on bacteremia  Interval History: no new positive cultures; patient is not responsive, remains on bipap; WBC 2.7, afebrile.  Remains on daptomycin and doxycycline   Physical Exam: Constitutional:  Vitals:   02/21/18 0800 02/21/18 0803  BP: (!) 105/52   Pulse: (!) 141   Resp: (!) 25   Temp:  97.6 F (36.4 C)  SpO2: (!) 88%    patient appears in some distress with his breathing Eyes: anicteric HENT: + bipap Respiratory: increased respiratory effort;  Cardiovascular: tachy irr GI: soft, nt, nd MS: no edema Neuro: not responsive  Review of Systems: Unable to be assessed due to mental status  Lab Results  Component Value Date   WBC 2.7 (L) 02/21/2018   HGB 10.6 (L) 02/21/2018   HCT 30.9 (L) 02/21/2018   MCV 85.1 02/21/2018   PLT 50 (L) 02/21/2018    Lab Results  Component Value Date   CREATININE 3.82 (H) 02/21/2018   BUN 147 (H) 02/21/2018   NA 141 02/21/2018   K 4.7 02/21/2018   CL 107 02/21/2018   CO2 17 (L) 02/21/2018    Lab Results  Component Value Date   ALT 24 03/07/2018   AST 40 02/09/2018   ALKPHOS 71 03/07/2018     Microbiology: Recent Results (from the past 240 hour(s))  Culture, blood (Routine x 2)     Status: Abnormal   Collection Time: 02/27/2018 12:51 PM  Result Value Ref Range Status   Specimen Description BLOOD LEFT ANTECUBITAL  Final   Special Requests   Final    BOTTLES DRAWN AEROBIC AND ANAEROBIC Blood Culture adequate volume   Culture  Setup Time   Final    GRAM POSITIVE COCCI IN BOTH AEROBIC AND ANAEROBIC BOTTLES Gram Stain Report Called to,Read Back By and Verified With: BROWN,KYLIE (MOSESCONE) @0826  BY MATTHEWS, B 11.10.19 [facility] CRITICAL RESULT CALLED TO, READ BACK BY AND VERIFIED WITH: PHARMD RACHEL R [id number] FCP Performed at [facility], [place] [address], [place], [place] [address]    Culture METHICILLIN RESISTANT STAPHYLOCOCCUS AUREUS (A)   Final   Report Status 02/19/2018 FINAL  Final   Organism ID, Bacteria METHICILLIN RESISTANT STAPHYLOCOCCUS AUREUS  Final      Susceptibility   Methicillin resistant staphylococcus aureus - MIC*    CIPROFLOXACIN >=8 RESISTANT Resistant     ERYTHROMYCIN >=8 RESISTANT Resistant     GENTAMICIN <=0.5 SENSITIVE Sensitive     OXACILLIN >=4 RESISTANT Resistant     TETRACYCLINE <=1 SENSITIVE Sensitive     VANCOMYCIN <=0.5 SENSITIVE Sensitive     TRIMETH/SULFA <=10 SENSITIVE Sensitive     CLINDAMYCIN <=0.25 SENSITIVE Sensitive     RIFAMPIN <=0.5 SENSITIVE Sensitive     Inducible Clindamycin NEGATIVE Sensitive     * METHICILLIN RESISTANT STAPHYLOCOCCUS AUREUS  Blood Culture ID Panel (Reflexed)     Status: Abnormal   Collection Time: 02/14/2018 12:51 PM  Result Value Ref Range Status   Enterococcus species NOT DETECTED NOT DETECTED Final   Listeria monocytogenes NOT DETECTED NOT DETECTED Final   Staphylococcus species DETECTED (A) NOT DETECTED Final    Comment: CRITICAL RESULT CALLED TO, READ BACK BY AND VERIFIED WITH: PHARMD RACHEL R 1454 [id number] FCP    Staphylococcus aureus (BCID) DETECTED (A) NOT DETECTED Final    Comment: Methicillin (oxacillin)-resistant Staphylococcus aureus (MRSA). MRSA is predictably resistant to beta-lactam antibiotics (except ceftaroline). Preferred therapy is vancomycin unless clinically  contraindicated. Patient requires contact precautions if  hospitalized. CRITICAL RESULT CALLED TO, READ BACK BY AND VERIFIED WITH: PHARMD RACHEL R 1454 [id number] FCP    Methicillin resistance DETECTED (A) NOT DETECTED Final    Comment: CRITICAL RESULT CALLED TO, READ BACK BY AND VERIFIED WITH: PHARMD RACHEL R 1454 [id number] FCP    Streptococcus species NOT DETECTED NOT DETECTED Final   Streptococcus agalactiae NOT DETECTED NOT DETECTED Final   Streptococcus pneumoniae NOT DETECTED NOT DETECTED Final   Streptococcus pyogenes NOT DETECTED NOT DETECTED Final   Acinetobacter baumannii NOT  DETECTED NOT DETECTED Final   Enterobacteriaceae species NOT DETECTED NOT DETECTED Final   Enterobacter cloacae complex NOT DETECTED NOT DETECTED Final   Escherichia coli NOT DETECTED NOT DETECTED Final   Klebsiella oxytoca NOT DETECTED NOT DETECTED Final   Klebsiella pneumoniae NOT DETECTED NOT DETECTED Final   Proteus species NOT DETECTED NOT DETECTED Final   Serratia marcescens NOT DETECTED NOT DETECTED Final   Haemophilus influenzae NOT DETECTED NOT DETECTED Final   Neisseria meningitidis NOT DETECTED NOT DETECTED Final   Pseudomonas aeruginosa NOT DETECTED NOT DETECTED Final   Candida albicans NOT DETECTED NOT DETECTED Final   Candida glabrata NOT DETECTED NOT DETECTED Final   Candida krusei NOT DETECTED NOT DETECTED Final   Candida parapsilosis NOT DETECTED NOT DETECTED Final   Candida tropicalis NOT DETECTED NOT DETECTED Final    Comment: Performed at [facility], [place]. [address], [place], [place] [address]  Culture, blood (Routine x 2)     Status: Abnormal   Collection Time: 02/17/2018  1:08 PM  Result Value Ref Range Status   Specimen Description   Final    BLOOD LEFT HAND BOTTLES DRAWN AEROBIC ONLY Performed at [facility], [address], [place], [place] [address]    Special Requests   Final    Blood Culture results may not be optimal due to an inadequate volume of blood received in culture bottles Performed at [facility], [address], [place], [place] [address]    Culture  Setup Time   Final    GRAM POSITIVE COCCI OTHER SET PREVIOUSLY CALLED 02/16/18 SAME MORPHOLOGY CRITICAL VALUE NOTED.  VALUE IS CONSISTENT WITH PREVIOUSLY REPORTED AND CALLED VALUE.    Culture (A)  Final    STAPHYLOCOCCUS AUREUS SUSCEPTIBILITIES PERFORMED ON PREVIOUS CULTURE WITHIN THE LAST 5 DAYS. Performed at [facility], [place] [address], [place], [place] [address]    Report Status 02/20/2018 FINAL  Final  Urine Culture     Status: None   Collection Time: 02/16/18 11:24 AM    Result Value Ref Range Status   Specimen Description URINE, RANDOM  Final   Special Requests NONE  Final   Culture   Final    NO GROWTH Performed at [facility], [place] [address], [place], [place] [address]    Report Status 02/17/2018 FINAL  Final  Culture, blood (Routine X 2) w Reflex to ID Panel     Status: None   Collection Time: 02/16/18 11:35 AM  Result Value Ref Range Status   Specimen Description BLOOD RIGHT HAND  Final   Special Requests   Final    BOTTLES DRAWN AEROBIC ONLY Blood Culture adequate volume   Culture   Final    NO GROWTH 5 DAYS Performed at [facility], [place] [address], [place], [place] [address]    Report Status 02/21/2018 FINAL  Final  Culture, blood (Routine X 2) w Reflex  to ID Panel     Status: None   Collection Time: 02/16/18 11:40 AM  Result Value Ref Range Status   Specimen Description BLOOD RIGHT HAND  Final   Special Requests   Final    BOTTLES DRAWN AEROBIC ONLY Blood Culture adequate volume   Culture   Final    NO GROWTH 5 DAYS Performed at [facility], [address] [place], [place] [address]    Report Status 02/21/2018 FINAL  Final  Respiratory Panel by PCR     Status: None   Collection Time: 02/16/18 11:46 AM  Result Value Ref Range Status   Adenovirus NOT DETECTED NOT DETECTED Final   Coronavirus 229E NOT DETECTED NOT DETECTED Final   Coronavirus HKU1 NOT DETECTED NOT DETECTED Final   Coronavirus NL63 NOT DETECTED NOT DETECTED Final   Coronavirus OC43 NOT DETECTED NOT DETECTED Final   Metapneumovirus NOT DETECTED NOT DETECTED Final   Rhinovirus / Enterovirus NOT DETECTED NOT DETECTED Final   Influenza A NOT DETECTED NOT DETECTED Final   Influenza B NOT DETECTED NOT DETECTED Final   Parainfluenza Virus 1 NOT DETECTED NOT DETECTED Final   Parainfluenza Virus 2 NOT DETECTED NOT DETECTED Final   Parainfluenza Virus 3 NOT DETECTED NOT DETECTED Final   Parainfluenza Virus 4 NOT DETECTED NOT DETECTED Final   Respiratory  Syncytial Virus NOT DETECTED NOT DETECTED Final   Bordetella pertussis NOT DETECTED NOT DETECTED Final   Chlamydophila pneumoniae NOT DETECTED NOT DETECTED Final   Mycoplasma pneumoniae NOT DETECTED NOT DETECTED Final    Comment: Performed at [facility], [place] [address] [place], [place] [address]  MRSA PCR Screening     Status: None   Collection Time: 02/17/18 10:56 AM  Result Value Ref Range Status   MRSA by PCR NEGATIVE NEGATIVE Final    Comment:        The GeneXpert MRSA Assay (FDA approved for NASAL specimens only), is one component of a comprehensive MRSA colonization surveillance program. It is not intended to diagnose MRSA infection nor to guide or monitor treatment for MRSA infections. Performed at [facility], [place] [address], [place], [place] [address]     Impression/Plan:  1. Bacteremia - on daptomycin and will need a prolonged course.  2.  Septic emboli - likely secondary to endocarditis.  On doxycyline for lung penetration.    3.  Respiratory failure - less responsive, CO2 decreasing.  DNI.    Dr. Baxter Flattery will be on tomorrow

## 2018-02-22 ENCOUNTER — Inpatient Hospital Stay (HOSPITAL_COMMUNITY): Payer: Medicare HMO

## 2018-02-22 DIAGNOSIS — E440 Moderate protein-calorie malnutrition: Secondary | ICD-10-CM

## 2018-02-22 LAB — COMPREHENSIVE METABOLIC PANEL
ALBUMIN: 1.6 g/dL — AB (ref 3.5–5.0)
ALK PHOS: 104 U/L (ref 38–126)
ALT: 46 U/L — AB (ref 0–44)
AST: 41 U/L (ref 15–41)
Anion gap: 17 — ABNORMAL HIGH (ref 5–15)
BILIRUBIN TOTAL: 1.1 mg/dL (ref 0.3–1.2)
BUN: 175 mg/dL — AB (ref 8–23)
CALCIUM: 8.4 mg/dL — AB (ref 8.9–10.3)
CO2: 18 mmol/L — AB (ref 22–32)
CREATININE: 4.68 mg/dL — AB (ref 0.61–1.24)
Chloride: 106 mmol/L (ref 98–111)
GFR calc Af Amer: 13 mL/min — ABNORMAL LOW (ref >60)
GFR calc non Af Amer: 11 mL/min — ABNORMAL LOW (ref >60)
GLUCOSE: 169 mg/dL — AB (ref 70–99)
Potassium: 4 mmol/L (ref 3.5–5.1)
SODIUM: 141 mmol/L (ref 135–145)
TOTAL PROTEIN: 4.3 g/dL — AB (ref 6.5–8.1)

## 2018-02-22 LAB — GLUCOSE, CAPILLARY
GLUCOSE-CAPILLARY: 166 mg/dL — AB (ref 70–99)
GLUCOSE-CAPILLARY: 151 mg/dL — AB (ref 70–99)
Glucose-Capillary: 148 mg/dL — ABNORMAL HIGH (ref 70–99)
Glucose-Capillary: 151 mg/dL — ABNORMAL HIGH (ref 70–99)
Glucose-Capillary: 165 mg/dL — ABNORMAL HIGH (ref 70–99)
Glucose-Capillary: 188 mg/dL — ABNORMAL HIGH (ref 70–99)
Glucose-Capillary: 195 mg/dL — ABNORMAL HIGH (ref 70–99)

## 2018-02-22 MED ORDER — METOPROLOL TARTRATE 5 MG/5ML IV SOLN
2.5000 mg | Freq: Four times a day (QID) | INTRAVENOUS | Status: DC
  Administered 2018-02-22 – 2018-02-23 (×4): 2.5 mg via INTRAVENOUS
  Filled 2018-02-22 (×4): qty 5

## 2018-02-22 MED ORDER — SODIUM BICARBONATE 8.4 % IV SOLN
INTRAVENOUS | Status: DC
  Administered 2018-02-22 – 2018-02-23 (×3): via INTRAVENOUS
  Filled 2018-02-22 (×4): qty 150

## 2018-02-22 NOTE — Progress Notes (Signed)
Patient taken off of bipap and placed on 12L salter high flow nasal cannula.  Will continue to monitor.

## 2018-02-22 NOTE — Progress Notes (Signed)
NAME:  Chris Lopez, MRN:  [id number], DOB:  1945-10-07, LOS: 0 ADMISSION DATE:  02/27/2018, CONSULTATION DATE: 02/16/2018 REFERRING MD: [facility], CHIEF COMPLAINT: Body aches  Brief History   72 yo male with Stage IVb Mantle cell lymphoma on chemotherapy 2 weeks prior to admission (bendamustine, rituximab) presented with generalized body aches, hypotension from sepsis related to MRSA bacteremia complicated by pneumonia with septic emboli, acute hypoxic respiratory failure, a fib with RVR and acute renal failure.  Past Medical History  BPH, COPD, Depression, DM, GERD, HTN  Significant Hospital Events   11/09 admit from [facility] ED 11/10 DNR 11/11 start IV amiodarone 11/12 removal of Rt chest port 11/13 add diltiazem IV 11/15 add trial of solumedrol for possible chemo pulmonary toxicity  Consults:  11/11 ID >> MRSA bacteremia 11/11 IR >> port removal 11/11 Cardiology >> a fib with RVR, elevated troponin, acute diastolic CHF 09/73 EP cardiology >> a fib  Procedures (surgical and bedside):  Rt chemo port >> 11/12  Significant Diagnostic Tests:  Doppler Rt leg 11/12 >> no DVT Echo 11/12 >> EF 55 to 60%, no vegetations noted HRCT chest 11/13 >> atherosclerosis, diffuse GGO b/l more at bases, consolidation b/l lower lobes Lt > Rt, scattered cavitary nodules, mild air trapping, mild centrilobular and paraseptal emphysema  Micro Data:  Blood 11/09 >> MRSA Respiratory viral panel 11/10 >> negative Urine 11/10 >> negative Blood 11/10 >> negative  Antimicrobials:  11/09 Zosyn> 11/10 11/09 Vancomycin> 11/10 11/11 Daptomycin >  11/14 Doxycycline >  Subjective:  Transitioned to HF O2 this AM.  Feels like he is getting more air in.  Still feels weak.  Denies chest pain or abdominal pain.  Objective   Blood pressure 140/61, pulse 90, temperature (!) 96.1 F (35.6 C), temperature source Axillary, resp. rate 11, height 6' (1.829 m), weight 96.6 kg, SpO2 96 %.    FiO2  (%):  [60 %-90 %] 60 %   Intake/Output Summary (Last 24 hours) at 02/22/2018 1229 Last data filed at 02/22/2018 0900 Gross per 24 hour  Intake 2411.17 ml  Output 300 ml  Net 2111.17 ml    Examination:  General - alert, appears fatigued Eyes - pupils reactive ENT - no sinus tenderness, no stridor Cardiac - irregular Chest - better air movement, b/l faint crackles Abdomen - soft, non tender, + bowel sounds Extremities - no cyanosis, clubbing, or edema Skin - no rashes Neuro - moves extremities, follows commands  CXR 11/16 (reviewed by me) >> better aeration  Assessment & Plan:   Acute hypoxic respiratory failure from HCAP with septic emboli. ? Pulmonary toxicity from chemotherapy. Hx of COPD. - DNR/DNI - oxygen to keep SpO2 88 to 95% - Bipap prn - continue solumedrol  - scheduled BDs  MRSA bacteremia and septic emboli. - port removed 11/12 - too unstable for TEE - daptomycin, doxycycline per ID  A fib with RVR. - heart rate improved 11/16 - amiodarone, cardizem per cardiology - no anticoagulation in setting of thrombocytopenia  Mantle cell lymphoma with chemo induced pancytopenia. - f/u CBC  Acute renal failure with ATN. Metabolic acidosis. - baseline creatinine 1.07 from 01/28/18 - f/u BMET - add HCO3 to IV fluid - not an appropriate candidate for HD  Steroid induced hyperglycemia. - SSI  Moderate protein calorie malnutrition. - unable to tolerate PO intake while on Bipap   Best practice:  Diet: NPO DVT prophylaxis: SCDs.  GI prophylaxis: pepcid Mobility: bedrest Code Status: DNR Family Communication:  updated wife at bedside  Labs:   CMP Latest Ref Rng & Units 02/22/2018 02/21/2018 02/20/2018  Glucose 70 - 99 mg/dL 169(H) 149(H) 164(H)  BUN 8 - 23 mg/dL 175(H) 147(H) 123(H)  Creatinine 0.61 - 1.24 mg/dL 4.68(H) 3.82(H) 3.36(H)  Sodium 135 - 145 mmol/L 141 141 140  Potassium 3.5 - 5.1 mmol/L 4.0 4.7 3.3(L)  Chloride 98 - 111 mmol/L 106 107  103  CO2 22 - 32 mmol/L 18(L) 17(L) 23  Calcium 8.9 - 10.3 mg/dL 8.4(L) 8.3(L) 8.2(L)  Total Protein 6.5 - 8.1 g/dL 4.3(L) - -  Total Bilirubin 0.3 - 1.2 mg/dL 1.1 - -  Alkaline Phos 38 - 126 U/L 104 - -  AST 15 - 41 U/L 41 - -  ALT 0 - 44 U/L 46(H) - -   CBC Latest Ref Rng & Units 02/21/2018 02/21/2018 02/20/2018  WBC 4.0 - 10.5 K/uL 4.1 2.7(L) 2.8(L)  Hemoglobin 13.0 - 17.0 g/dL 9.8(L) 10.6(L) 10.1(L)  Hematocrit 39.0 - 52.0 % 28.1(L) 30.9(L) 27.9(L)  Platelets 150 - 400 K/uL 49(L) 50(L) 48(L)   ABG    Component Value Date/Time   PHART 7.401 02/20/2018 2153   PCO2ART 35.0 02/20/2018 2153   PO2ART 68.0 (L) 02/20/2018 2153   HCO3 21.8 02/20/2018 2153   TCO2 23 02/20/2018 2153   ACIDBASEDEF 3.0 (H) 02/20/2018 2153   O2SAT 94.0 02/20/2018 2153   CBG (last 3)  Recent Labs    02/22/18 0424 02/22/18 0734 02/22/18 1124  GLUCAP 151* 166* 151*    CC time 32 minutes  Chesley Mires, MD [place] 02/22/2018, 12:29 PM

## 2018-02-22 NOTE — Progress Notes (Signed)
Progress Note  Patient Name: Chris Lopez Date of Encounter: 02/22/2018  Primary Cardiologist: No primary care provider on file.   Subjective   Remains very lethargic.  Required high dose O2 at 100% yesterday but now weaned down to 60%.  HR better in afib at 103bpm on Amio 60mg /hr.  Inpatient Medications    Scheduled Meds: . chlorhexidine  15 mL Mouth Rinse BID  . insulin aspart  0-20 Units Subcutaneous Q4H  . ipratropium  0.5 mg Nebulization Q6H  . levalbuterol  0.63 mg Nebulization Q6H  . mouth rinse  15 mL Mouth Rinse q12n4p  . methylPREDNISolone (SOLU-MEDROL) injection  60 mg Intravenous Q6H   Continuous Infusions: . sodium chloride 50 mL/hr at 02/22/18 0900  . amiodarone 60 mg/hr (02/22/18 0900)  . DAPTOmycin (CUBICIN)  IV Stopped (02/21/18 2048)  . diltiazem (CARDIZEM) infusion 15 mg/hr (02/22/18 0900)  . doxycycline (VIBRAMYCIN) IV Stopped (02/22/18 0249)  . famotidine (PEPCID) IV     PRN Meds: acetaminophen, fentaNYL (SUBLIMAZE) injection, Glycerin (Adult), ondansetron (ZOFRAN) IV   Vital Signs    Vitals:   02/22/18 0735 02/22/18 0800 02/22/18 0803 02/22/18 0900  BP:  110/74 110/74 129/65  Pulse:  89 96 86  Resp:  19 (!) 27 (!) 23  Temp: (!) 97 F (36.1 C)     TempSrc: Axillary     SpO2:  100% 98% 97%  Weight:      Height:        Intake/Output Summary (Last 24 hours) at 02/22/2018 0955 Last data filed at 02/22/2018 0900 Gross per 24 hour  Intake 2852.75 ml  Output 337.5 ml  Net 2515.25 ml   Filed Weights   02/20/18 0500 02/21/18 0435 02/22/18 0500  Weight: 93.8 kg 94.3 kg 96.6 kg    Telemetry    Atrial fibrillation with HR 103bpm  - Personally Reviewed  ECG    No new EKG to review - Personally Reviewed  Physical Exam   GEN: thin cachectic appearing Neck: No JVD Cardiac: irregularly irregular, no murmurs, rubs, or gallops.  Respiratory: coarse BS and rhonchi at the bases GI: Soft, nontender, non-distended  MS: No edema; No  deformity. Neuro:  cannot assess due to lethargy Psych: cannot assess due to lethargy  Labs    Chemistry Recent Labs  Lab 02/09/2018 1253  02/20/18 0349 02/21/18 0301 02/22/18 0242  NA 124*   < > 140 141 141  K 4.2   < > 3.3* 4.7 4.0  CL 92*   < > 103 107 106  CO2 20*   < > 23 17* 18*  GLUCOSE 90   < > 164* 149* 169*  BUN 55*   < > 123* 147* 175*  CREATININE 3.56*   < > 3.36* 3.82* 4.68*  CALCIUM 7.2*   < > 8.2* 8.3* 8.4*  PROT 5.4*  --   --   --  4.3*  ALBUMIN 2.6*  --   --  1.7* 1.6*  AST 40  --   --   --  41  ALT 24  --   --   --  46*  ALKPHOS 71  --   --   --  104  BILITOT 1.2  --   --   --  1.1  GFRNONAA 16*   < > 17* 15* 11*  GFRAA 18*   < > 20* 17* 13*  ANIONGAP 12   < > 14 17* 17*   < > = values in this  interval not displayed.     Hematology Recent Labs  Lab 02/20/18 0349 02/21/18 0301 02/21/18 1106  WBC 2.8* 2.7* 4.1  RBC 3.35* 3.63* 3.30*  HGB 10.1* 10.6* 9.8*  HCT 27.9* 30.9* 28.1*  MCV 83.3 85.1 85.2  MCH 30.1 29.2 29.7  MCHC 36.2* 34.3 34.9  RDW 17.0* 17.6* 17.9*  PLT 48* 50* 49*    Cardiac Enzymes Recent Labs  Lab 02/16/18 1123 02/16/18 1834 02/17/18 0142  TROPONINI 0.25* 0.24* 0.24*   No results for input(s): TROPIPOC in the last 168 hours.   BNP Recent Labs  Lab 02/18/18 0443  BNP 199.0*     DDimer No results for input(s): DDIMER in the last 168 hours.   Radiology    Dg Chest 1 View  Result Date: 02/21/2018 CLINICAL DATA:  Shortness of Breath EXAM: CHEST  1 VIEW COMPARISON:  February 19, 2018 chest radiograph and chest CT; June 29, 2010 FINDINGS: There is interstitial prominence in the mid and lower lung zones, likely interstitial edema. There are small pleural effusions bilaterally. There is bibasilar atelectasis. There is mild consolidation in the medial left base. Heart is mildly enlarged with mild pulmonary venous hypertension. There is aortic atherosclerosis. No adenopathy. There is postoperative change in the lower cervical  spine. IMPRESSION: There is a degree of pulmonary vascular congestion with areas of apparent interstitial pulmonary edema bilaterally, essentially stable. Mild consolidation medial left base is likely due to atelectasis with questionable superimposed pneumonia. More patchy atelectasis is also noted in each lung base with small pleural effusions bilaterally. Stable cardiac silhouette. There is aortic atherosclerosis. Aortic Atherosclerosis (ICD10-I70.0). Electronically Signed   By: Lowella Grip III M.D.   On: 02/21/2018 07:42   Dg Chest Port 1 View  Result Date: 02/22/2018 CLINICAL DATA:  Respiratory failure EXAM: PORTABLE CHEST 1 VIEW COMPARISON:  02/21/2018 FINDINGS: Cardiac shadow is stable. Lungs are well aerated bilaterally. Previously seen vascular congestion has improved slightly in the interval from the prior exam. Some more focal atelectatic changes are noted in the left mid lung. Stable interstitial infiltrate is noted on the right likely related to edema. IMPRESSION: Improved aeration bilaterally although persistent interstitial infiltrate and atelectatic changes are noted as described. Electronically Signed   By: Inez Catalina M.D.   On: 02/22/2018 07:56    Cardiac Studies   Echocardiogram 02/2018: Study Conclusions  - Left ventricle: The cavity size was normal. Wall thickness was normal. Systolic function was normal. The estimated ejection fraction was in the range of 55% to 60%.  Impressions:  - There was no evidence of a vegetation. There is extreme tachycardia during the study, limiting the accuracy of the data. Consider repeat echo when rate is controlled.  Recommendations: Consider transesophageal echocardiography if clinically indicated for high clinical suspicion for endocarditis.  Patient Profile     72 y.o. male with Stage 4b mantle cell lymphoma currently on chemo Tx with Bendamustine and Rituximab with subsequent leukopenia who was admitted with  body aches, fever, hypotension and dx with MRSA bacteremia and neutropenic septic shock. He also has HTN, type 2 DM and COPD. Cardiology following for atrial fibrillation with RVR  Assessment & Plan    1. Atrial fibrillation with RVR -Remains in afib but HR improved and now at 103bpm -given high O2 requirements will try to wean down IV Amio to 30mg /hr - Continue IV diltiazem gtt - BP improved so start Lopressor 2.5mg  IV q6 hours and titrate as needed for HR control  2. MRSA bacteremia/sepsis -  had port-a-cath for chemotherapy for lymphoma  removed a few days ago. -CT Chest c/f septic emboli.  -ID following and do not feel TEE is indicated as he will need a prolonged course of IV antibiotics regardless of findings.  -Continue IV antibiotics per primary team/ ID  3. Acute respiratory failure -felt to be 2/2 PNA and septic emboli on CT Chest.  -No change in status despite adequate diuresis.  -Still requiring Bipap at this time and required high dose O2 at 100% yesterday but now weaned down to 60% -Continue IV antibiotics per primary team/ID  4. Acute diastolic CHF -Echo with EF 55-60%.  -Volume overload felt to be 2/2 fluid resuscitation in the setting of septic shock and Afib RVR.  -He was diuresed with IV lasix but stopped once euvolemia achieved and worsening renal function -Continue to hold diuresis at this time  5. AKI -2/2 septic shock.  -Renal US without significant findings.  -Cr continues to trend upward (3.82>4.68) - Continue management per primary team  The patient is critically ill with multiple organ systems failure and requires high complexity decision making for assessment and support, frequent evaluation and titration of therapies, application of advanced monitoring technologies and extensive interpretation of multiple databases. Critical Care Time devoted to patient care services described in this note independent of APP time is 60 minutes with >50% of time spent  in direct patient care.        For questions or updates, please contact [place] Please consult [email or web address] for contact info under Cardiology/STEMI.      Signed, Fransico Him, MD  02/22/2018, 9:55 AM

## 2018-02-22 NOTE — Progress Notes (Signed)
RT called to patient room due to patient having a drop in sats to the 70s.  Placed patient back on bipap on 100% FIO2.  Patient's sats began to improved and once were stabilized at 98% decreased FIO2 back to 60% due to sat goal being from 88%-95%.  Will continue to monitor.

## 2018-02-23 ENCOUNTER — Inpatient Hospital Stay (HOSPITAL_COMMUNITY): Payer: Medicare HMO

## 2018-02-23 LAB — CBC
HEMATOCRIT: 24.1 % — AB (ref 39.0–52.0)
Hemoglobin: 8.8 g/dL — ABNORMAL LOW (ref 13.0–17.0)
MCH: 28.6 pg (ref 26.0–34.0)
MCHC: 36.5 g/dL — AB (ref 30.0–36.0)
MCV: 78.2 fL — ABNORMAL LOW (ref 80.0–100.0)
Platelets: 57 10*3/uL — ABNORMAL LOW (ref 150–400)
RBC: 3.08 MIL/uL — ABNORMAL LOW (ref 4.22–5.81)
RDW: 17.2 % — AB (ref 11.5–15.5)
WBC: 6 10*3/uL (ref 4.0–10.5)
nRBC: 0 % (ref 0.0–0.2)

## 2018-02-23 LAB — GLUCOSE, CAPILLARY
GLUCOSE-CAPILLARY: 175 mg/dL — AB (ref 70–99)
GLUCOSE-CAPILLARY: 205 mg/dL — AB (ref 70–99)
Glucose-Capillary: 206 mg/dL — ABNORMAL HIGH (ref 70–99)
Glucose-Capillary: 177 mg/dL — ABNORMAL HIGH (ref 70–99)
Glucose-Capillary: 202 mg/dL — ABNORMAL HIGH (ref 70–99)
Glucose-Capillary: 214 mg/dL — ABNORMAL HIGH (ref 70–99)

## 2018-02-23 LAB — BASIC METABOLIC PANEL
ANION GAP: 19 — AB (ref 5–15)
BUN: 209 mg/dL — ABNORMAL HIGH (ref 8–23)
CO2: 20 mmol/L — ABNORMAL LOW (ref 22–32)
CREATININE: 4.92 mg/dL — AB (ref 0.61–1.24)
Calcium: 8.6 mg/dL — ABNORMAL LOW (ref 8.9–10.3)
Chloride: 105 mmol/L (ref 98–111)
GFR calc Af Amer: 12 mL/min — ABNORMAL LOW (ref >60)
GFR, EST NON AFRICAN AMERICAN: 11 mL/min — AB (ref >60)
Glucose, Bld: 180 mg/dL — ABNORMAL HIGH (ref 70–99)
POTASSIUM: 4.9 mmol/L (ref 3.5–5.1)
SODIUM: 144 mmol/L (ref 135–145)

## 2018-02-23 MED ORDER — METHYLPREDNISOLONE SODIUM SUCC 125 MG IJ SOLR
40.0000 mg | Freq: Four times a day (QID) | INTRAMUSCULAR | Status: DC
  Administered 2018-02-23 – 2018-02-25 (×11): 40 mg via INTRAVENOUS
  Filled 2018-02-23 (×11): qty 2

## 2018-02-23 MED ORDER — METOPROLOL TARTRATE 5 MG/5ML IV SOLN
5.0000 mg | Freq: Four times a day (QID) | INTRAVENOUS | Status: DC
  Administered 2018-02-23 – 2018-02-25 (×10): 5 mg via INTRAVENOUS
  Filled 2018-02-23 (×11): qty 5

## 2018-02-23 NOTE — Progress Notes (Signed)
NAME:  Chris Lopez, MRN:  [id number], DOB:  December 28, 1945, LOS: 0 ADMISSION DATE:  02/23/2018, CONSULTATION DATE: 02/16/2018 REFERRING MD: [facility], CHIEF COMPLAINT: Body aches  Brief History   72 yo male with Stage IVb Mantle cell lymphoma on chemotherapy 2 weeks prior to admission (bendamustine, rituximab) presented with generalized body aches, hypotension from sepsis related to MRSA bacteremia complicated by pneumonia with septic emboli, acute hypoxic respiratory failure, a fib with RVR and acute renal failure.  Past Medical History  BPH, COPD, Depression, DM, GERD, HTN  Significant Hospital Events   11/09 admit from [facility] ED 11/10 DNR 11/11 start IV amiodarone 11/12 removal of Rt chest port 11/13 add diltiazem IV 11/15 add trial of solumedrol for possible chemo pulmonary toxicity 11/16 add HCO3 to IV fluid  Consults:  11/11 ID >> MRSA bacteremia 11/11 IR >> port removal 11/11 Cardiology >> a fib with RVR, elevated troponin, acute diastolic CHF 67/59 EP cardiology >> a fib  Procedures (surgical and bedside):  Rt chemo port >> 11/12  Significant Diagnostic Tests:  Doppler Rt leg 11/12 >> no DVT Echo 11/12 >> EF 55 to 60%, no vegetations noted HRCT chest 11/13 >> atherosclerosis, diffuse GGO b/l more at bases, consolidation b/l lower lobes Lt > Rt, scattered cavitary nodules, mild air trapping, mild centrilobular and paraseptal emphysema  Micro Data:  Blood 11/09 >> MRSA Respiratory viral panel 11/10 >> negative Urine 11/10 >> negative Blood 11/10 >> negative  Antimicrobials:  11/09 Zosyn> 11/10 11/09 Vancomycin> 11/10 11/11 Daptomycin >  11/14 Doxycycline >  Subjective:  Feels like he is getting his energy back.  Tolerated some time off Bipap yesterday.  Not much appetite.  Denies chest/abdominal pain.   Objective   Blood pressure 114/65, pulse 75, temperature (!) 95.9 F (35.5 C), temperature source Axillary, resp. rate (!) 21, height 6' (1.829 m),  weight 97.6 kg, SpO2 97 %.    FiO2 (%):  [60 %-80 %] 60 %   Intake/Output Summary (Last 24 hours) at 02/23/2018 0758 Last data filed at 02/23/2018 1638 Gross per 24 hour  Intake 2801.67 ml  Output 1275 ml  Net 1526.67 ml    Examination:  General - more alert Eyes - pupils reactive ENT - Bipap mask on Cardiac - irregular Chest - scattered rhonchi Abdomen - soft, non tender, + bowel sounds Extremities - no cyanosis, clubbing, or edema Skin - no rashes Neuro - moves extremities, follows commands Psych - normal mood and behavior  CXR 11/17 (reviewed by me) >> slight decrease in b/l ASD  Assessment & Plan:   Acute hypoxic respiratory failure from HCAP with septic emboli. ? Pulmonary toxicity from chemotherapy. Hx of COPD. - DNR/DNI - oxygen to keep SpO2 88 to 95% - will try on heated high flow oxygen with Bipap as needed - change solumedrol to 40 mg q6h - scheduled BDs  MRSA bacteremia and septic emboli. - port removed 11/12 - too unstable for TEE - daptomycin, doxycycline per ID  A fib with RVR. - HR improved - amiodarone, cardizem per cardiology - no anticoagulation in setting of thrombocytopenia  Mantle cell lymphoma with chemo induced pancytopenia. - improving - f/u CBC  Acute renal failure with ATN. Metabolic acidosis. - baseline creatinine 1.07 from 01/28/18 - still making urine - don't think he would be appropriate candidate for HD >> defer renal assessment - continue HCO3 in IV fluid; decrease rate to 50 ml/hr - f/u BMET  Steroid induced hyperglycemia. - SSI  Moderate protein calorie malnutrition. - try him on clear liquids if he can maintain himself off Bipap for parts of the day   Best practice:  Diet: clear liquids DVT prophylaxis: SCDs.  GI prophylaxis: pepcid Mobility: bedrest Code Status: DNR Family Communication: updated wife at bedside  Labs:   CMP Latest Ref Rng & Units 02/23/2018 02/22/2018 02/21/2018  Glucose 70 - 99 mg/dL  180(H) 169(H) 149(H)  BUN 8 - 23 mg/dL 209(H) 175(H) 147(H)  Creatinine 0.61 - 1.24 mg/dL 4.92(H) 4.68(H) 3.82(H)  Sodium 135 - 145 mmol/L 144 141 141  Potassium 3.5 - 5.1 mmol/L 4.9 4.0 4.7  Chloride 98 - 111 mmol/L 105 106 107  CO2 22 - 32 mmol/L 20(L) 18(L) 17(L)  Calcium 8.9 - 10.3 mg/dL 8.6(L) 8.4(L) 8.3(L)  Total Protein 6.5 - 8.1 g/dL - 4.3(L) -  Total Bilirubin 0.3 - 1.2 mg/dL - 1.1 -  Alkaline Phos 38 - 126 U/L - 104 -  AST 15 - 41 U/L - 41 -  ALT 0 - 44 U/L - 46(H) -   CBC Latest Ref Rng & Units 02/23/2018 02/21/2018 02/21/2018  WBC 4.0 - 10.5 K/uL 6.0 4.1 2.7(L)  Hemoglobin 13.0 - 17.0 g/dL 8.8(L) 9.8(L) 10.6(L)  Hematocrit 39.0 - 52.0 % 24.1(L) 28.1(L) 30.9(L)  Platelets 150 - 400 K/uL 57(L) 49(L) 50(L)   ABG    Component Value Date/Time   PHART 7.401 02/20/2018 2153   PCO2ART 35.0 02/20/2018 2153   PO2ART 68.0 (L) 02/20/2018 2153   HCO3 21.8 02/20/2018 2153   TCO2 23 02/20/2018 2153   ACIDBASEDEF 3.0 (H) 02/20/2018 2153   O2SAT 94.0 02/20/2018 2153   CBG (last 3)  Recent Labs    02/22/18 2341 02/23/18 0403 02/23/18 [place], MD [place] [facility] 02/23/2018, 7:58 AM

## 2018-02-23 NOTE — Progress Notes (Signed)
Patient placed on heated high-flow nasal cannula per MD order.  Patient tolerating well on 20L and FIO2 of 70%.  Patient now able to cough up secretions.  Will continue to monitor.

## 2018-02-23 NOTE — Progress Notes (Signed)
Progress Note  Patient Name: Chris Lopez Date of Encounter: 02/23/2018  Primary Cardiologist: No primary care provider on file.   Subjective   More awake this am.  Eating this am.  HR much improved in the low 100's.    Inpatient Medications    Scheduled Meds: . chlorhexidine  15 mL Mouth Rinse BID  . insulin aspart  0-20 Units Subcutaneous Q4H  . ipratropium  0.5 mg Nebulization Q6H  . levalbuterol  0.63 mg Nebulization Q6H  . mouth rinse  15 mL Mouth Rinse q12n4p  . methylPREDNISolone (SOLU-MEDROL) injection  40 mg Intravenous Q6H  . metoprolol tartrate  2.5 mg Intravenous Q6H   Continuous Infusions: . amiodarone 30 mg/hr (02/23/18 0554)  . DAPTOmycin (CUBICIN)  IV Stopped (02/21/18 2048)  . diltiazem (CARDIZEM) infusion 15 mg/hr (02/23/18 0500)  . doxycycline (VIBRAMYCIN) IV Stopped (02/23/18 0243)  . famotidine (PEPCID) IV Stopped (02/22/18 1117)  .  sodium bicarbonate  infusion 1000 mL 50 mL/hr at 02/23/18 0833   PRN Meds: acetaminophen, fentaNYL (SUBLIMAZE) injection, Glycerin (Adult), ondansetron (ZOFRAN) IV   Vital Signs    Vitals:   02/23/18 0329 02/23/18 0410 02/23/18 0739 02/23/18 0825  BP: 114/65     Pulse: 75   98  Resp: (!) 21   15  Temp:  (!) 97.2 F (36.2 C) (!) 95.9 F (35.5 C)   TempSrc:  Axillary Axillary   SpO2: 97%   93%  Weight:      Height:        Intake/Output Summary (Last 24 hours) at 02/23/2018 0956 Last data filed at 02/23/2018 1829 Gross per 24 hour  Intake 2607.84 ml  Output 1275 ml  Net 1332.84 ml   Filed Weights   02/21/18 0435 02/22/18 0500 02/23/18 0130  Weight: 94.3 kg 96.6 kg 97.6 kg    Telemetry    Atrial fibrillation with HR 100bpm  - Personally Reviewed  ECG    No new EKG to review - Personally Reviewed  Physical Exam   GEN: thin and cachectic appearing HEENT: Normal NECK: No JVD; No carotid bruits LYMPHATICS: No lymphadenopathy CARDIAC:irregularly irregular, no murmurs, rubs, gallops RESPIRATORY:   Clear to auscultation without rales, wheezing or rhonchi  ABDOMEN: Soft, non-tender, non-distended MUSCULOSKELETAL:  No edema; No deformity  SKIN: Warm and dry NEUROLOGIC:  Alert and oriented x 3 PSYCHIATRIC:  Normal affect    Labs    Chemistry Recent Labs  Lab 02/21/18 0301 02/22/18 0242 02/23/18 0316  NA 141 141 144  K 4.7 4.0 4.9  CL 107 106 105  CO2 17* 18* 20*  GLUCOSE 149* 169* 180*  BUN 147* 175* 209*  CREATININE 3.82* 4.68* 4.92*  CALCIUM 8.3* 8.4* 8.6*  PROT  --  4.3*  --   ALBUMIN 1.7* 1.6*  --   AST  --  41  --   ALT  --  46*  --   ALKPHOS  --  104  --   BILITOT  --  1.1  --   GFRNONAA 15* 11* 11*  GFRAA 17* 13* 12*  ANIONGAP 17* 17* 19*     Hematology Recent Labs  Lab 02/21/18 0301 02/21/18 1106 02/23/18 0316  WBC 2.7* 4.1 6.0  RBC 3.63* 3.30* 3.08*  HGB 10.6* 9.8* 8.8*  HCT 30.9* 28.1* 24.1*  MCV 85.1 85.2 78.2*  MCH 29.2 29.7 28.6  MCHC 34.3 34.9 36.5*  RDW 17.6* 17.9* 17.2*  PLT 50* 49* 57*    Cardiac Enzymes Recent Labs  Lab 02/16/18 1123 02/16/18 1834 02/17/18 0142  TROPONINI 0.25* 0.24* 0.24*   No results for input(s): TROPIPOC in the last 168 hours.   BNP Recent Labs  Lab 02/18/18 0443  BNP 199.0*     DDimer No results for input(s): DDIMER in the last 168 hours.   Radiology    Dg Chest Port 1 View  Result Date: 02/23/2018 CLINICAL DATA:  Respiratory failure EXAM: PORTABLE CHEST 1 VIEW COMPARISON:  02/22/2018 FINDINGS: Mild patchy right upper lobe opacity, atelectasis versus pneumonia, unchanged. Mild patchy bilateral lower lobe opacities, likely atelectasis. No definite pleural effusions. No frank interstitial edema. The heart is normal in size. Degenerative changes of the visualized thoracolumbar spine. Cervical spine fixation hardware. IMPRESSION: Mild patchy right upper lobe opacity, atelectasis versus pneumonia, unchanged. Mild patchy bilateral lower lobe opacities, likely atelectasis. Electronically Signed   By:  Julian Hy M.D.   On: 02/23/2018 07:13   Dg Chest Port 1 View  Result Date: 02/22/2018 CLINICAL DATA:  Respiratory failure EXAM: PORTABLE CHEST 1 VIEW COMPARISON:  02/21/2018 FINDINGS: Cardiac shadow is stable. Lungs are well aerated bilaterally. Previously seen vascular congestion has improved slightly in the interval from the prior exam. Some more focal atelectatic changes are noted in the left mid lung. Stable interstitial infiltrate is noted on the right likely related to edema. IMPRESSION: Improved aeration bilaterally although persistent interstitial infiltrate and atelectatic changes are noted as described. Electronically Signed   By: Inez Catalina M.D.   On: 02/22/2018 07:56    Cardiac Studies   Echocardiogram 02/2018: Study Conclusions  - Left ventricle: The cavity size was normal. Wall thickness was normal. Systolic function was normal. The estimated ejection fraction was in the range of 55% to 60%.  Impressions:  - There was no evidence of a vegetation. There is extreme tachycardia during the study, limiting the accuracy of the data. Consider repeat echo when rate is controlled.  Recommendations: Consider transesophageal echocardiography if clinically indicated for high clinical suspicion for endocarditis.  Patient Profile     72 y.o. male with Stage 4b mantle cell lymphoma currently on chemo Tx with Bendamustine and Rituximab with subsequent leukopenia who was admitted with body aches, fever, hypotension and dx with MRSA bacteremia and neutropenic septic shock. He also has HTN, type 2 DM and COPD. Cardiology following for atrial fibrillation with RVR  Assessment & Plan    1. Atrial fibrillation with RVR -Remains in afib but HR continues to improved and now in low 100's -continue on IV Amio to 30mg /hr for now until taking adequate PO and pills and then change to PO - Continue IV diltiazem gtt - Will increase lopressor to 5mg  IV q6 hours and hope to  try to wean off Amio to reduce risk of lung toxicity since he is on high O2 concentration -not candidate for anticoagulation due to thrombocytopenia  2. MRSA bacteremia/sepsis -had port-a-cath for chemotherapy for lymphoma  removed a few days ago. -CT Chest c/w HCAP and septic emboli.  -ID following and do not feel TEE is indicated as he will need a prolonged course of IV antibiotics regardless of findings.  -Continue IV antibiotics per primary team/ ID  3. Acute respiratory failure -felt to be 2/2 PNA and septic emboli on CT Chest.  -No change in status despite adequate diuresis.  -now off BiPAP and on Williams on 70% FIO2 -Continue IV antibiotics per primary team/ID  4. Acute diastolic CHF -Echo with EF 55-60%.  -Volume overload felt to be 2/2  fluid resuscitation in the setting of septic shock and Afib RVR.  -He was diuresed with IV lasix but stopped once euvolemia achieved and worsening renal function -Continue to hold diuresis at this time as creatinine continues to trend upward  5. AKI -2/2 septic shock.  -Renal US without significant findings.  -Cr continues to trend upward (3.82>4.68>4.92) -Continue management per primary team  The patient is critically ill with multiple organ systems failure and requires high complexity decision making for assessment and support, frequent evaluation and titration of therapies, application of advanced monitoring technologies and extensive interpretation of multiple databases. Critical Care Time devoted to patient care services described in this note independent of APP time is 60 minutes with >50% of time spent in direct patient care.        For questions or updates, please contact [place] Please consult [email or web address] for contact info under Cardiology/STEMI.      Signed, Fransico Him, MD  02/23/2018, 9:56 AM

## 2018-02-24 ENCOUNTER — Inpatient Hospital Stay (HOSPITAL_COMMUNITY): Payer: Medicare HMO

## 2018-02-24 DIAGNOSIS — L8990 Pressure ulcer of unspecified site, unspecified stage: Secondary | ICD-10-CM

## 2018-02-24 DIAGNOSIS — R0902 Hypoxemia: Secondary | ICD-10-CM

## 2018-02-24 LAB — CBC
HEMATOCRIT: 25 % — AB (ref 39.0–52.0)
Hemoglobin: 9.3 g/dL — ABNORMAL LOW (ref 13.0–17.0)
MCH: 28.4 pg (ref 26.0–34.0)
MCHC: 37.2 g/dL — ABNORMAL HIGH (ref 30.0–36.0)
MCV: 76.5 fL — ABNORMAL LOW (ref 80.0–100.0)
NRBC: 0.4 % — AB (ref 0.0–0.2)
PLATELETS: 60 10*3/uL — AB (ref 150–400)
RBC: 3.27 MIL/uL — ABNORMAL LOW (ref 4.22–5.81)
RDW: 16.7 % — AB (ref 11.5–15.5)
WBC: 9 10*3/uL (ref 4.0–10.5)

## 2018-02-24 LAB — GLUCOSE, CAPILLARY
GLUCOSE-CAPILLARY: 164 mg/dL — AB (ref 70–99)
GLUCOSE-CAPILLARY: 184 mg/dL — AB (ref 70–99)
GLUCOSE-CAPILLARY: 189 mg/dL — AB (ref 70–99)
GLUCOSE-CAPILLARY: 168 mg/dL — AB (ref 70–99)
Glucose-Capillary: 208 mg/dL — ABNORMAL HIGH (ref 70–99)
Glucose-Capillary: 190 mg/dL — ABNORMAL HIGH (ref 70–99)

## 2018-02-24 LAB — BASIC METABOLIC PANEL
Anion gap: 19 — ABNORMAL HIGH (ref 5–15)
BUN: 214 mg/dL — ABNORMAL HIGH (ref 8–23)
CALCIUM: 8.3 mg/dL — AB (ref 8.9–10.3)
CHLORIDE: 105 mmol/L (ref 98–111)
CO2: 21 mmol/L — ABNORMAL LOW (ref 22–32)
CREATININE: 4.75 mg/dL — AB (ref 0.61–1.24)
GFR, EST AFRICAN AMERICAN: 13 mL/min — AB (ref >60)
GFR, EST NON AFRICAN AMERICAN: 11 mL/min — AB (ref >60)
Glucose, Bld: 173 mg/dL — ABNORMAL HIGH (ref 70–99)
Potassium: 5 mmol/L (ref 3.5–5.1)
SODIUM: 145 mmol/L (ref 135–145)

## 2018-02-24 MED ORDER — SODIUM CHLORIDE 0.9 % IV SOLN
INTRAVENOUS | Status: DC | PRN
  Administered 2018-02-24 – 2018-02-25 (×3): 500 mL via INTRAVENOUS

## 2018-02-24 MED ORDER — ACETAMINOPHEN 325 MG PO TABS: 650.0000 mg | ORAL_TABLET | Freq: Four times a day (QID) | ORAL | Status: DC | PRN

## 2018-02-24 MED ORDER — INSULIN ASPART 100 UNIT/ML ~~LOC~~ SOLN: 0.0000 [IU] | Freq: Every day | SUBCUTANEOUS | Status: DC

## 2018-02-24 MED ORDER — FAMOTIDINE 20 MG PO TABS
20.0000 mg | ORAL_TABLET | Freq: Every day | ORAL | Status: DC
  Administered 2018-02-24: 20 mg via ORAL
  Filled 2018-02-24 (×2): qty 1

## 2018-02-24 MED ORDER — INSULIN ASPART 100 UNIT/ML ~~LOC~~ SOLN
0.0000 [IU] | Freq: Three times a day (TID) | SUBCUTANEOUS | Status: DC
  Administered 2018-02-24: 4 [IU] via SUBCUTANEOUS
  Administered 2018-02-24: 7 [IU] via SUBCUTANEOUS
  Administered 2018-02-25 (×3): 4 [IU] via SUBCUTANEOUS

## 2018-02-24 MED ORDER — GUAIFENESIN ER 600 MG PO TB12
1200.0000 mg | ORAL_TABLET | Freq: Two times a day (BID) | ORAL | Status: DC | PRN
  Administered 2018-02-25: 1200 mg via ORAL
  Filled 2018-02-24 (×3): qty 2

## 2018-02-24 NOTE — Progress Notes (Signed)
Patient taken off of bipap and placed on high flow nasal cannula due to patient continuously tearing bipap mask off of his face.  Will continue to monitor.

## 2018-02-24 NOTE — Progress Notes (Signed)
Patient placed back on bipap due to patient sats reading 81% and maintaining.  Patient initially began trying to tear bipap mask off of his face however finally settled down after some coaching.  Currently tolerating well.  Will continue to monitor.

## 2018-02-24 NOTE — Progress Notes (Addendum)
Progress Note  Patient Name: Chris Lopez Date of Encounter: 02/24/2018  Primary Cardiologist: New  Subjective   Denies CP   Breathing is fair    Inpatient Medications    Scheduled Meds: . chlorhexidine  15 mL Mouth Rinse BID  . insulin aspart  0-20 Units Subcutaneous Q4H  . ipratropium  0.5 mg Nebulization Q6H  . levalbuterol  0.63 mg Nebulization Q6H  . mouth rinse  15 mL Mouth Rinse q12n4p  . methylPREDNISolone (SOLU-MEDROL) injection  40 mg Intravenous Q6H  . metoprolol tartrate  5 mg Intravenous Q6H   Continuous Infusions: . amiodarone 30 mg/hr (02/24/18 0700)  . DAPTOmycin (CUBICIN)  IV Stopped (02/23/18 2140)  . diltiazem (CARDIZEM) infusion 15 mg/hr (02/24/18 0700)  . doxycycline (VIBRAMYCIN) IV Stopped (02/24/18 0149)  . famotidine (PEPCID) IV 20 mg (02/23/18 1027)  .  sodium bicarbonate  infusion 1000 mL 50 mL/hr at 02/24/18 0700   PRN Meds: acetaminophen, fentaNYL (SUBLIMAZE) injection, Glycerin (Adult), ondansetron (ZOFRAN) IV   Vital Signs    Vitals:   02/24/18 0500 02/24/18 0600 02/24/18 0700 02/24/18 0730  BP: 108/63 106/69 102/77   Pulse: 82 95 84   Resp: 20 13 14    Temp:    (!) 96.4 F (35.8 C)  TempSrc:    Axillary  SpO2: (!) 86% (!) 87% (!) 85%   Weight: 100.4 kg     Height:        Intake/Output Summary (Last 24 hours) at 02/24/2018 0808 Last data filed at 02/24/2018 0700 Gross per 24 hour  Intake 2473.81 ml  Output 950 ml  Net 1523.81 ml   Filed Weights   02/22/18 0500 02/23/18 0130 02/24/18 0500  Weight: 96.6 kg 97.6 kg 100.4 kg    Telemetry   Afib   90s   - Personally Reviewed   Physical Exam   GEN: thin cachectic appearing Neck: No JVD Cardiac: irregularly irregular, no murmurs, rubs, or gallops.  Respiratory: Coarse BS   GI: Soft, nontender, non-distended  MS: No edema; No deformity. Neuro:  cannot assess due to lethargy Psych: cannot assess due to lethargy  Labs    Chemistry Recent Labs  Lab 02/21/18 0301  02/22/18 0242 02/23/18 0316 02/24/18 0429  NA 141 141 144 145  K 4.7 4.0 4.9 5.0  CL 107 106 105 105  CO2 17* 18* 20* 21*  GLUCOSE 149* 169* 180* 173*  BUN 147* 175* 209* 214*  CREATININE 3.82* 4.68* 4.92* 4.75*  CALCIUM 8.3* 8.4* 8.6* 8.3*  PROT  --  4.3*  --   --   ALBUMIN 1.7* 1.6*  --   --   AST  --  41  --   --   ALT  --  46*  --   --   ALKPHOS  --  104  --   --   BILITOT  --  1.1  --   --   GFRNONAA 15* 11* 11* 11*  GFRAA 17* 13* 12* 13*  ANIONGAP 17* 17* 19* 19*     Hematology Recent Labs  Lab 02/21/18 1106 02/23/18 0316 02/24/18 0429  WBC 4.1 6.0 9.0  RBC 3.30* 3.08* 3.27*  HGB 9.8* 8.8* 9.3*  HCT 28.1* 24.1* 25.0*  MCV 85.2 78.2* 76.5*  MCH 29.7 28.6 28.4  MCHC 34.9 36.5* 37.2*  RDW 17.9* 17.2* 16.7*  PLT 49* 57* 60*    Cardiac Enzymes No results for input(s): TROPONINI in the last 168 hours. No results for input(s): TROPIPOC in  the last 168 hours.   BNP Recent Labs  Lab 02/18/18 0443  BNP 199.0*     DDimer No results for input(s): DDIMER in the last 168 hours.   Radiology    Dg Chest Port 1 View  Result Date: 02/23/2018 CLINICAL DATA:  Respiratory failure EXAM: PORTABLE CHEST 1 VIEW COMPARISON:  02/22/2018 FINDINGS: Mild patchy right upper lobe opacity, atelectasis versus pneumonia, unchanged. Mild patchy bilateral lower lobe opacities, likely atelectasis. No definite pleural effusions. No frank interstitial edema. The heart is normal in size. Degenerative changes of the visualized thoracolumbar spine. Cervical spine fixation hardware. IMPRESSION: Mild patchy right upper lobe opacity, atelectasis versus pneumonia, unchanged. Mild patchy bilateral lower lobe opacities, likely atelectasis. Electronically Signed   By: Julian Hy M.D.   On: 02/23/2018 07:13    Cardiac Studies   Echocardiogram 02/2018: Study Conclusions  - Left ventricle: The cavity size was normal. Wall thickness was normal. Systolic function was normal. The estimated  ejection fraction was in the range of 55% to 60%.  Impressions:  - There was no evidence of a vegetation. There is extreme tachycardia during the study, limiting the accuracy of the data. Consider repeat echo when rate is controlled.  Recommendations: Consider transesophageal echocardiography if clinically indicated for high clinical suspicion for endocarditis.  Patient Profile     72 y.o. male with Stage 4b mantle cell lymphoma currently on chemo Tx with Bendamustine and Rituximab with subsequent leukopenia who was admitted with body aches, fever, hypotension and dx with MRSA bacteremia and neutropenic septic shock. He also has HTN, type 2 DM and COPD. Cardiology following for atrial fibrillation with RVR  Assessment & Plan    1. Atrial fibrillation with RVR  Heart rates are better   I would keep on amiodarone and dilt IV for now until he is clearly tolerated Platelets remain low   61 K    2. MRSA bacteremia/sepsis -Continue IV antibiotics per primary team/ ID   3. Acute diastolic CHF -Echo with EF 55-60%.  Follow I/O closely    5. AKI -2/2 septic shock.  -Cr 4.75        For questions or updates, please contact [place] Please consult [email or web address] for contact info under Cardiology/STEMI.      Signed, Dorris Carnes, MD  02/24/2018, 8:08 AM

## 2018-02-24 NOTE — Progress Notes (Signed)
RT note: patient taken off of bipap and placed on heated high-flow nasal cannula at 35L with FIO2 of 100%.  Currently tolerating well.  Will continue to monitor.

## 2018-02-24 NOTE — Progress Notes (Signed)
NAME:  Chris Lopez, MRN:  [id number], DOB:  09/04/1945, LOS: 0 ADMISSION DATE:  02/27/2018, CONSULTATION DATE: 02/25/2018 REFERRING MD: [facility], CHIEF COMPLAINT: Body aches  Brief History   72 yo male with Stage IVb Mantle cell lymphoma on chemotherapy 2 weeks prior to admission (bendamustine, rituximab) presented with generalized body aches, hypotension from sepsis related to MRSA bacteremia complicated by pneumonia with septic emboli, acute hypoxic respiratory failure, a fib with RVR and acute renal failure.  Past Medical History  BPH, COPD, Depression, DM, GERD, HTN  Significant Hospital Events   11/09 admit from [facility] ED 11/10 DNR 11/11 start IV amiodarone 11/12 removal of Rt chest port 11/13 add diltiazem IV 11/15 add trial of solumedrol for possible chemo pulmonary toxicity 11/16 add HCO3 to IV fluid 11/18 tolerating HF oxygen during the day, d/c HCO3 from IV fluid  Consults:  11/11 ID >> MRSA bacteremia 11/11 IR >> port removal 11/11 Cardiology >> a fib with RVR, elevated troponin, acute diastolic CHF 71/21 EP cardiology >> a fib  Procedures (surgical and bedside):  Rt chemo port >> 11/12  Significant Diagnostic Tests:  Doppler Rt leg 11/12 >> no DVT Echo 11/12 >> EF 55 to 60%, no vegetations noted HRCT chest 11/13 >> atherosclerosis, diffuse GGO b/l more at bases, consolidation b/l lower lobes Lt > Rt, scattered cavitary nodules, mild air trapping, mild centrilobular and paraseptal emphysema  Micro Data:  Blood 11/09 >> MRSA Respiratory viral panel 11/10 >> negative Urine 11/10 >> negative Blood 11/10 >> negative  Antimicrobials:  11/09 Zosyn> 11/10 11/09 Vancomycin> 11/10 11/11 Daptomycin >  11/14 Doxycycline >  Subjective:  Confused overnight.  Better this AM.  Stayed off Bipap during day.     Objective   Blood pressure (!) 102/58, pulse (!) 55, temperature (!) 96 F (35.6 C), temperature source Axillary, resp. rate 14, height 6' (1.829  m), weight 100.4 kg, SpO2 92 %.    FiO2 (%):  [30 %-100 %] 100 %   Intake/Output Summary (Last 24 hours) at 02/24/2018 1117 Last data filed at 02/24/2018 1000 Gross per 24 hour  Intake 2476.51 ml  Output 1300 ml  Net 1176.51 ml    Examination:  General - alert Eyes - pupils reactive ENT - no sinus tenderness, no stridor Cardiac - irregular Chest - b/l rhonchi Abdomen - soft, non tender, + bowel sounds Extremities - no cyanosis, clubbing, or edema Skin - no rashes Neuro - normal strength, moves extremities, follows commands Psych - intermittent mild confusion  CXR 11/18 (reviewed by me) >> no change in ASD  Assessment & Plan:   Acute hypoxic respiratory failure from HCAP with septic emboli. ? Pulmonary toxicity from chemotherapy. Hx of COPD. - DNR/DNI - high flow oxygen to keep SpO2 88 to 95% - Bipap prn - continue solumedrol 40 mg q6h for pneumonitis - scheduled BDs  MRSA bacteremia and septic emboli. - port removed 11/12 - too unstable for TEE - daptomycin, doxycycline per ID  A fib with RVR. - amiodarone, cardizem per cardiology - no anticoagulation in setting of thrombocytopenia  Mantle cell lymphoma with chemo induced pancytopenia. - improving - f/u CBC  Acute renal failure with ATN. Metabolic acidosis. - baseline creatinine 1.07 from 01/28/18 - d/c HCO3 from IV fluid - renal fx improved some 11/18 - don't think he is suitable candidate for HD - f/u BMET  Steroid induced hyperglycemia. - SSI  Moderate protein calorie malnutrition. - change to full liquids  Best practice:  Diet: full liquids DVT prophylaxis: SCDs.  GI prophylaxis: pepcid Mobility: bedrest Code Status: DNR Family Communication: updated wife at bedside  Labs:   CMP Latest Ref Rng & Units 02/24/2018 02/23/2018 02/22/2018  Glucose 70 - 99 mg/dL 173(H) 180(H) 169(H)  BUN 8 - 23 mg/dL 214(H) 209(H) 175(H)  Creatinine 0.61 - 1.24 mg/dL 4.75(H) 4.92(H) 4.68(H)  Sodium 135 -  145 mmol/L 145 144 141  Potassium 3.5 - 5.1 mmol/L 5.0 4.9 4.0  Chloride 98 - 111 mmol/L 105 105 106  CO2 22 - 32 mmol/L 21(L) 20(L) 18(L)  Calcium 8.9 - 10.3 mg/dL 8.3(L) 8.6(L) 8.4(L)  Total Protein 6.5 - 8.1 g/dL - - 4.3(L)  Total Bilirubin 0.3 - 1.2 mg/dL - - 1.1  Alkaline Phos 38 - 126 U/L - - 104  AST 15 - 41 U/L - - 41  ALT 0 - 44 U/L - - 46(H)   CBC Latest Ref Rng & Units 02/24/2018 02/23/2018 02/21/2018  WBC 4.0 - 10.5 K/uL 9.0 6.0 4.1  Hemoglobin 13.0 - 17.0 g/dL 9.3(L) 8.8(L) 9.8(L)  Hematocrit 39.0 - 52.0 % 25.0(L) 24.1(L) 28.1(L)  Platelets 150 - 400 K/uL 60(L) 57(L) 49(L)   ABG    Component Value Date/Time   PHART 7.401 02/20/2018 2153   PCO2ART 35.0 02/20/2018 2153   PO2ART 68.0 (L) 02/20/2018 2153   HCO3 21.8 02/20/2018 2153   TCO2 23 02/20/2018 2153   ACIDBASEDEF 3.0 (H) 02/20/2018 2153   O2SAT 94.0 02/20/2018 2153   CBG (last 3)  Recent Labs    02/24/18 0521 02/24/18 0729 02/24/18 [place], MD [facility] 02/24/2018, 11:17 AM

## 2018-02-24 NOTE — Progress Notes (Signed)
[facility] for Infect[facility] Disease    Date of Admission:  03/08/2018   Total days of antibiotics 9        Day 7 dapto        Day 5 doxy           ID: Chris Lopez is a 72 y.o. male with MRSA bacteremia with presumed endocarditis due to pulmonary septic emboli c/b afib with rvr, on amio and dilt gtt, and AKI, and Acute respiratory failure. Active Problems:   Sepsis ([place])   Severe sepsis ([place])   Septic shock ([facility])   AKI (acute kidney injury) ([place])   Hyponatremia   Acute respiratory failure ([facility])   Acute renal failure (H[facility])   Bacteremia   Atrial fibrillation with rapid ventricular response ([facility])   Elevated troponin   Acute diastolic CHF (congestive heart failure) (H[facility])   Malnutrition of moderate degree   Pressure injury of skin    Subjective: Afebrile. Currently on high flow oxygen though having low sats at 81-82%.   Medications:  . chlorhexidine  15 mL Mouth Rinse BID  . famotidine  20 mg Oral QHS  . insulin aspart  0-20 Units Subcutaneous TID WC  . insulin aspart  0-5 Units Subcutaneous QHS  . ipratropium  0.5 mg Nebulization Q6H  . levalbuterol  0.63 mg Nebulization Q6H  . mouth rinse  15 mL Mouth Rinse q12n4p  . methylPREDNISolone (SOLU-MEDROL) injection  40 mg Intravenous Q6H  . metoprolol tartrate  5 mg Intravenous Q6H    Objective: Vital signs in last 24 hours: Temp:  [96 F (35.6 C)-97.7 F (36.5 C)] 97.7 F (36.5 C) (11/18 1501) Pulse Rate:  [36-144] 69 (11/18 1400) Resp:  [12-21] 17 (11/18 1400) BP: (94-134)/(58-77) 102/59 (11/18 1400) SpO2:  [85 %-95 %] 89 % (11/18 1400) FiO2 (%):  [30 %-100 %] 100 % (11/18 1501) Weight:  [100.4 kg] 100.4 kg (11/18 0500) Physical Exam  Constitutional: He is oriented to person, place, and time. He appears chronically ill and well-nourished. No distress.  HENT: wearing high flow oxygen Mouth/Throat: Oropharynx is clear and moist. No oropharyngeal exudate.  Cardiovascular: Normal rate, regular rhythm and normal  heart sounds. Exam reveals no gallop and no friction rub.  No murmur heard.  Pulmonary/Chest: Effort normal and breath sounds -bilateral rhonchi Abdominal: Soft. Bowel sounds are normal. He exhibits no distension. There is no tenderness.  Ext: trace pitting edema Neurological: He is alert and oriented to person, place, and time.  Skin: Skin is warm and dry. No rash noted. No erythema.  Psychiatric: He has a normal mood and affect. His behavior is normal.     Lab Results Recent Labs    02/23/18 0316 02/24/18 0429  WBC 6.0 9.0  HGB 8.8* 9.3*  HCT 24.1* 25.0*  NA 144 145  K 4.9 5.0  CL 105 105  CO2 20* 21*  BUN 209* 214*  CREATININE 4.92* 4.75*   Liver Panel Recent Labs    02/22/18 0242  PROT 4.3*  ALBUMIN 1.6*  AST 41  ALT 46*  ALKPHOS 104  BILITOT 1.1    Microbiology: 11/10 blood cx ngtd Studies/Results: Dg Chest Port 1 View  Result Date: 02/24/2018 CLINICAL DATA:  Respiratory failure. EXAM: PORTABLE CHEST 1 VIEW COMPARISON:  02/23/2018 and CT chest 02/19/2018. FINDINGS: Patient's head obscures the superior mediastinum. Heart size is grossly stable. Thoracic aorta is calcified. Mixed interstitial and airspace opacification bilaterally, similar to yesterday's exam. IMPRESSION: 1. Interstitial and airspace  opacification appears similar to yesterday's exam and may be due to edema or pneumonia. 2.  Aortic atherosclerosis (ICD10-170.0). Electronically Signed   By: Lorin Picket M.D.   On: 02/24/2018 08:17   Dg Chest Port 1 View  Result Date: 02/23/2018 CLINICAL DATA:  Respiratory failure EXAM: PORTABLE CHEST 1 VIEW COMPARISON:  02/22/2018 FINDINGS: Mild patchy right upper lobe opacity, atelectasis versus pneumonia, unchanged. Mild patchy bilateral lower lobe opacities, likely atelectasis. No definite pleural effusions. No frank interstitial edema. The heart is normal in size. Degenerative changes of the visualized thoracolumbar spine. Cervical spine fixation hardware.  IMPRESSION: Mild patchy right upper lobe opacity, atelectasis versus pneumonia, unchanged. Mild patchy bilateral lower lobe opacities, likely atelectasis. Electronically Signed   By: Julian Hy M.D.   On: 02/23/2018 07:13     Assessment/Plan: MRSA bacteremia = continue on renally dosed daptomycin at 8mg /kg/QOD. Recommend to treat for 6 wk  MRSA pneumonia, presumed septic emboli = continue on doxycycline. Thrombocytopenia precludes using linezolid  Pancytopenia 2/2 sepsis = improving, continue to follow [facility] for [facility] Cell: [phone] Pager: [phone]  02/24/2018, 4:13 PM

## 2018-02-24 NOTE — Progress Notes (Addendum)
Pt with progressive restlessness, picking at lines and frequently removing gown and nasal cannula, ongoing confusion unchanged. No change in accessory muscle use, pt denies sob. Oxygen sat 85% on HFNC at 40 %. Called [facility] nurse to inform of changes. Will give fentanyl ivp and have RT place pt back on bipap

## 2018-02-25 ENCOUNTER — Ambulatory Visit (HOSPITAL_COMMUNITY): Payer: Medicare HMO | Admitting: Hematology

## 2018-02-25 ENCOUNTER — Other Ambulatory Visit (HOSPITAL_COMMUNITY): Payer: Medicare HMO

## 2018-02-25 ENCOUNTER — Ambulatory Visit (HOSPITAL_COMMUNITY): Payer: Medicare HMO

## 2018-02-25 LAB — GLUCOSE, CAPILLARY
GLUCOSE-CAPILLARY: 169 mg/dL — AB (ref 70–99)
Glucose-Capillary: 189 mg/dL — ABNORMAL HIGH (ref 70–99)
Glucose-Capillary: 165 mg/dL — ABNORMAL HIGH (ref 70–99)

## 2018-02-25 LAB — CBC
HEMATOCRIT: 25.5 % — AB (ref 39.0–52.0)
HEMOGLOBIN: 9.1 g/dL — AB (ref 13.0–17.0)
MCH: 28.3 pg (ref 26.0–34.0)
MCHC: 35.7 g/dL (ref 30.0–36.0)
MCV: 79.2 fL — AB (ref 80.0–100.0)
Platelets: 61 10*3/uL — ABNORMAL LOW (ref 150–400)
RBC: 3.22 MIL/uL — ABNORMAL LOW (ref 4.22–5.81)
RDW: 18 % — ABNORMAL HIGH (ref 11.5–15.5)
WBC: 11.1 10*3/uL — ABNORMAL HIGH (ref 4.0–10.5)
nRBC: 0.4 % — ABNORMAL HIGH (ref 0.0–0.2)

## 2018-02-25 LAB — BASIC METABOLIC PANEL
Anion gap: 16 — ABNORMAL HIGH (ref 5–15)
BUN: 207 mg/dL — AB (ref 8–23)
CHLORIDE: 106 mmol/L (ref 98–111)
CO2: 25 mmol/L (ref 22–32)
Calcium: 8.3 mg/dL — ABNORMAL LOW (ref 8.9–10.3)
Creatinine, Ser: 4.47 mg/dL — ABNORMAL HIGH (ref 0.61–1.24)
GFR calc Af Amer: 14 mL/min — ABNORMAL LOW (ref >60)
GFR calc non Af Amer: 12 mL/min — ABNORMAL LOW (ref >60)
GLUCOSE: 202 mg/dL — AB (ref 70–99)
POTASSIUM: 3.3 mmol/L — AB (ref 3.5–5.1)
Sodium: 147 mmol/L — ABNORMAL HIGH (ref 135–145)

## 2018-02-25 LAB — CK: Total CK: 148 U/L (ref 49–397)

## 2018-02-25 MED ORDER — POTASSIUM CHLORIDE 10 MEQ/100ML IV SOLN
10.0000 meq | INTRAVENOUS | Status: AC
  Administered 2018-02-25 (×4): 10 meq via INTRAVENOUS
  Filled 2018-02-25 (×4): qty 100

## 2018-02-25 MED ORDER — FUROSEMIDE 10 MG/ML IJ SOLN
INTRAMUSCULAR | Status: AC
  Filled 2018-02-25: qty 4

## 2018-02-25 MED ORDER — FUROSEMIDE 10 MG/ML IJ SOLN
40.0000 mg | Freq: Once | INTRAMUSCULAR | Status: AC
  Administered 2018-02-25: 40 mg via INTRAVENOUS
  Filled 2018-02-25: qty 4

## 2018-02-25 MED ORDER — DILTIAZEM HCL 60 MG PO TABS
60.0000 mg | ORAL_TABLET | Freq: Three times a day (TID) | ORAL | Status: DC
  Administered 2018-02-25 (×2): 60 mg via ORAL
  Filled 2018-02-25 (×3): qty 1

## 2018-02-25 NOTE — Progress Notes (Signed)
Spoke with Dr. Domingo Cocking of [facility] who agreed to take patient on their service.  Transferring patient to stepdown bed. Appreciate [facility]'s assistance in taking over care.  Harolyn Rutherford, DO [facility], PGY-2

## 2018-02-25 NOTE — Progress Notes (Addendum)
Pharmacy Antibiotic Note  Chris Lopez is a 72 y.o. male admitted on 02/12/2018 with MRSA bacteremia.  Pharmacy has been consulted for daptomycin dosing. Patient has a history of mantle cell lymphoma currently undergoing chemotherapy. Patient has a port in place. WBC currently 11.1 and afebrile. Scr today is up to 4.47 (Baseline around 1). CK today 148, up from 82 on 11/11.  Plan: This patient's current antibiotics will be continued without adjustments. Continue daptomycin 852m IV q48h. Continue to monitor daily Scr, WBC, temp, and s/sx infection. Continue to monitor weekly CK.  Height: 6' (182.9 cm) Weight: 217 lb 6 oz (98.6 kg) IBW/kg (Calculated) : 77.6  Temp (24hrs), Avg:96.9 F (36.1 C), Min:96 F (35.6 C), Max:97.7 F (36.5 C)  Recent Labs  Lab 02/21/18 0301 02/21/18 1106 02/22/18 0242 02/23/18 0316 02/24/18 0429 02/25/18 0611  WBC 2.7* 4.1  --  6.0 9.0 11.1*  CREATININE 3.82*  --  4.68* 4.92* 4.75* 4.47*    Estimated Creatinine Clearance: 18.4 mL/min (A) (by C-G formula based on SCr of 4.47 mg/dL (H)).    Allergies  Allergen Reactions  . Bee Venom Anaphylaxis    Allergic to bee stings not the kit  . Feldene [Piroxicam] Rash and Dermatitis    Antimicrobials this admission: 11/9 Vanc >>11/11 11/9 Zosyn >>11/11 11/11 Dapto>>  Dose adjustments this admission: n/a  Microbiology results: 11/9 BCx: 1/2 MRSA 11/10 Bcx: NGTD 11/10 Ucx: NGTD 11/11 MRSA by PCR: Negative  Thank you for allowing pharmacy to be a part of this patient's care.  TEvalyn Casco11/19/2019 10:01 AM

## 2018-02-25 NOTE — Progress Notes (Signed)
NAME:  Chris Lopez, MRN:  [id number], DOB:  11/17/1945, LOS: 0 ADMISSION DATE:  02/14/2018, CONSULTATION DATE: 02/22/2018 REFERRING MD: [facility], CHIEF COMPLAINT: Body aches  Brief History   72 yo male with Stage IVb Mantle cell lymphoma on chemotherapy 2 weeks prior to admission (bendamustine, rituximab) presented with generalized body aches, hypotension from sepsis related to MRSA bacteremia complicated by pneumonia with septic emboli, acute hypoxic respiratory failure, a fib with RVR and acute renal failure.  Past Medical History  BPH, COPD, Depression, DM, GERD, HTN  Significant Hospital Events   11/09 admit from [facility] ED 11/10 DNR 11/11 start IV amiodarone 11/12 removal of Rt chest port 11/13 add diltiazem IV 11/15 add trial of solumedrol for possible chemo pulmonary toxicity 11/16 add HCO3 to IV fluid 11/18 tolerating HF oxygen during the day, d/c HCO3 from IV fluid  Consults:  11/11 ID >> MRSA bacteremia 11/11 IR >> port removal 11/11 Cardiology >> a fib with RVR, elevated troponin, acute diastolic CHF 84/13 EP cardiology >> a fib  Procedures (surgical and bedside):  Rt chemo port >> 11/12  Significant Diagnostic Tests:  Doppler Rt leg 11/12 >> no DVT Echo 11/12 >> EF 55 to 60%, no vegetations noted HRCT chest 11/13 >> atherosclerosis, diffuse GGO b/l more at bases, consolidation b/l lower lobes Lt > Rt, scattered cavitary nodules, mild air trapping, mild centrilobular and paraseptal emphysema  Micro Data:  Blood 11/09 >> MRSA Respiratory viral panel 11/10 >> negative Urine 11/10 >> negative Blood 11/10 >> negative  Antimicrobials:  11/09 Zosyn> 11/10 11/09 Vancomycin> 11/10 11/11 Daptomycin >  11/14 Doxycycline >  Subjective:  Patient responds to verbal commands and has appropriate responses. No pain at this time. Denies any chest pain or discomfort. Did not tolerate BiPap overnight.    Objective   Blood pressure (!) 126/57, pulse 70,  temperature (!) 96.5 F (35.8 C), temperature source Axillary, resp. rate (!) 22, height 6' (1.829 m), weight 98.6 kg, SpO2 91 %.    FiO2 (%):  [90 %-100 %] 100 %   Intake/Output Summary (Last 24 hours) at 02/25/2018 0852 Last data filed at 02/25/2018 0800 Gross per 24 hour  Intake 1726.02 ml  Output 2170 ml  Net -443.98 ml    Examination:  General - alert and awake, responds appropriately to questions Eyes - pupils reactive Cardiac - normal rate, irregularly irregular rhythm Chest - scattered bilateral rhonchi Abdomen - soft, non-tender, + bowel sounds Extremities - no cyanosis, no edema Skin - no rashes Neuro - no gross deficits Psych - intermittent mild confusion  Assessment & Plan:   Acute hypoxic respiratory failure from HCAP with septic emboli. ? Pulmonary toxicity from chemotherapy. Hx of COPD. - DNR/DNI - high flow oxygen to keep SpO2 88 to 95% - Bipap prn - continue solumedrol 40 mg q6h for pneumonitis, day 3 - scheduled BDs  MRSA bacteremia and septic emboli. - port removed 11/12 - too unstable for TEE - daptomycin, doxycycline per ID  A fib with RVR. - discont amiodarone, cardizem per cardiology; appreciate their recs - no anticoagulation in setting of thrombocytopenia  Mantle cell lymphoma with chemo induced pancytopenia. - stable, improving - f/u CBC  Hypokalemia - 40mEq IV x 4 today - Recheck BMP tomorrow  Acute renal failure with ATN. Metabolic acidosis. - baseline creatinine 1.07 from 01/28/18 - renal fx mild improvement but remains significantly elevated - f/u BMET  Steroid induced hyperglycemia. - SSI  Moderate protein calorie malnutrition. -  change to full liquids  Best practice:  Diet: full liquids DVT prophylaxis: SCDs.  GI prophylaxis: pepcid Mobility: bedrest Code Status: DNR Family Communication: updated wife at bedside  Labs:   CMP Latest Ref Rng & Units 02/25/2018 02/24/2018 02/23/2018  Glucose 70 - 99 mg/dL 202(H)  173(H) 180(H)  BUN 8 - 23 mg/dL 207(H) 214(H) 209(H)  Creatinine 0.61 - 1.24 mg/dL 4.47(H) 4.75(H) 4.92(H)  Sodium 135 - 145 mmol/L 147(H) 145 144  Potassium 3.5 - 5.1 mmol/L 3.3(L) 5.0 4.9  Chloride 98 - 111 mmol/L 106 105 105  CO2 22 - 32 mmol/L 25 21(L) 20(L)  Calcium 8.9 - 10.3 mg/dL 8.3(L) 8.3(L) 8.6(L)  Total Protein 6.5 - 8.1 g/dL - - -  Total Bilirubin 0.3 - 1.2 mg/dL - - -  Alkaline Phos 38 - 126 U/L - - -  AST 15 - 41 U/L - - -  ALT 0 - 44 U/L - - -   CBC Latest Ref Rng & Units 02/25/2018 02/24/2018 02/23/2018  WBC 4.0 - 10.5 K/uL 11.1(H) 9.0 6.0  Hemoglobin 13.0 - 17.0 g/dL 9.1(L) 9.3(L) 8.8(L)  Hematocrit 39.0 - 52.0 % 25.5(L) 25.0(L) 24.1(L)  Platelets 150 - 400 K/uL 61(L) 60(L) 57(L)   ABG    Component Value Date/Time   PHART 7.401 02/20/2018 2153   PCO2ART 35.0 02/20/2018 2153   PO2ART 68.0 (L) 02/20/2018 2153   HCO3 21.8 02/20/2018 2153   TCO2 23 02/20/2018 2153   ACIDBASEDEF 3.0 (H) 02/20/2018 2153   O2SAT 94.0 02/20/2018 2153   CBG (last 3)  Recent Labs    02/24/18 1925 02/24/18 2201 02/25/18 [place], DO [facility], PGY-2 02/25/2018, 9:24 AM

## 2018-02-25 NOTE — Progress Notes (Signed)
Progress Note  Patient Name: Chris Lopez Date of Encounter: 02/25/2018  Primary Cardiologist: New  Subjective   Pt resting   Appears comfortable  Inpatient Medications    Scheduled Meds: . chlorhexidine  15 mL Mouth Rinse BID  . famotidine  20 mg Oral QHS  . insulin aspart  0-20 Units Subcutaneous TID WC  . insulin aspart  0-5 Units Subcutaneous QHS  . ipratropium  0.5 mg Nebulization Q6H  . levalbuterol  0.63 mg Nebulization Q6H  . mouth rinse  15 mL Mouth Rinse q12n4p  . methylPREDNISolone (SOLU-MEDROL) injection  40 mg Intravenous Q6H  . metoprolol tartrate  5 mg Intravenous Q6H   Continuous Infusions: . sodium chloride 10 mL/hr at 02/25/18 0800  . amiodarone 30 mg/hr (02/25/18 0800)  . DAPTOmycin (CUBICIN)  IV Stopped (02/23/18 2140)  . diltiazem (CARDIZEM) infusion 15 mg/hr (02/25/18 0800)  . doxycycline (VIBRAMYCIN) IV Stopped (02/25/18 0153)   PRN Meds: sodium chloride, acetaminophen, fentaNYL (SUBLIMAZE) injection, Glycerin (Adult), guaiFENesin, ondansetron (ZOFRAN) IV   Vital Signs    Vitals:   02/25/18 0600 02/25/18 0700 02/25/18 0745 02/25/18 0800  BP: 102/64 112/72  (!) 126/57  Pulse: 85 98  70  Resp: 16 19  (!) 22  Temp:   (!) 96.5 F (35.8 C)   TempSrc:   Axillary   SpO2: (!) 89% 91%  91%  Weight:      Height:        Intake/Output Summary (Last 24 hours) at 02/25/2018 0839 Last data filed at 02/25/2018 0800 Gross per 24 hour  Intake 1726.02 ml  Output 2170 ml  Net -443.98 ml   Filed Weights   02/23/18 0130 02/24/18 0500 02/25/18 0500  Weight: 97.6 kg 100.4 kg 98.6 kg    Telemetry   afib  90s   Personally Reviewed   Physical Exam   GEN: thin cachectic appearing Neck: No JVD Cardiac: irregularly irregular, no murmurs, rubs, or gallops.  Respiratory: Bilateral rhonchi GI: Soft, nontender, non-distended  MS: Tr edema; No deformity.  Labs    Chemistry Recent Labs  Lab 02/21/18 0301 02/22/18 0242 02/23/18 0316  02/24/18 0429 02/25/18 0611  NA 141 141 144 145 147*  K 4.7 4.0 4.9 5.0 3.3*  CL 107 106 105 105 106  CO2 17* 18* 20* 21* 25  GLUCOSE 149* 169* 180* 173* 202*  BUN 147* 175* 209* 214* 207*  CREATININE 3.82* 4.68* 4.92* 4.75* 4.47*  CALCIUM 8.3* 8.4* 8.6* 8.3* 8.3*  PROT  --  4.3*  --   --   --   ALBUMIN 1.7* 1.6*  --   --   --   AST  --  41  --   --   --   ALT  --  46*  --   --   --   ALKPHOS  --  104  --   --   --   BILITOT  --  1.1  --   --   --   GFRNONAA 15* 11* 11* 11* 12*  GFRAA 17* 13* 12* 13* 14*  ANIONGAP 17* 17* 19* 19* 16*     Hematology Recent Labs  Lab 02/23/18 0316 02/24/18 0429 02/25/18 0611  WBC 6.0 9.0 11.1*  RBC 3.08* 3.27* 3.22*  HGB 8.8* 9.3* 9.1*  HCT 24.1* 25.0* 25.5*  MCV 78.2* 76.5* 79.2*  MCH 28.6 28.4 28.3  MCHC 36.5* 37.2* 35.7  RDW 17.2* 16.7* 18.0*  PLT 57* 60* 61*    Cardiac  Enzymes No results for input(s): TROPONINI in the last 168 hours. No results for input(s): TROPIPOC in the last 168 hours.   BNP No results for input(s): BNP, PROBNP in the last 168 hours.   DDimer No results for input(s): DDIMER in the last 168 hours.   Radiology    Dg Chest Port 1 View  Result Date: 02/24/2018 CLINICAL DATA:  Respiratory failure. EXAM: PORTABLE CHEST 1 VIEW COMPARISON:  02/23/2018 and CT chest 02/19/2018. FINDINGS: Patient's head obscures the superior mediastinum. Heart size is grossly stable. Thoracic aorta is calcified. Mixed interstitial and airspace opacification bilaterally, similar to yesterday's exam. IMPRESSION: 1. Interstitial and airspace opacification appears similar to yesterday's exam and may be due to edema or pneumonia. 2.  Aortic atherosclerosis (ICD10-170.0). Electronically Signed   By: Lorin Picket M.D.   On: 02/24/2018 08:17    Cardiac Studies   Echocardiogram 02/2018: Study Conclusions  - Left ventricle: The cavity size was normal. Wall thickness was normal. Systolic function was normal. The estimated  ejection fraction was in the range of 55% to 60%.  Impressions:  - There was no evidence of a vegetation. There is extreme tachycardia during the study, limiting the accuracy of the data. Consider repeat echo when rate is controlled.  Recommendations: Consider transesophageal echocardiography if clinically indicated for high clinical suspicion for endocarditis.  Patient Profile     72 y.o. male with Stage 4b mantle cell lymphoma currently on chemo Tx with Bendamustine and Rituximab with subsequent leukopenia who was admitted with body aches, fever, hypotension and dx with MRSA bacteremia and neutropenic septic shock. He also has HTN, type 2 DM and COPD. Cardiology following for atrial fibrillation with RVR  Assessment & Plan    1. Atrial fibrillation with RVR  Keep on amiodarone IV for now   Switch dilt to po 60 q 8 hours   Follow BP and HR    2. MRSA bacteremia/sepsis -Continue IV antibiotics per primary team/ ID   3. Acute diastolic CHF -Echo with EF 55-60%.  Follow I/O closely    5. AKI -2/2 septic shock.  -Cr 4.75        For questions or updates, please contact [place] Please consult [email or web address] for contact info under Cardiology/STEMI.      Signed, Dorris Carnes, MD  02/25/2018, 8:39 AM

## 2018-02-25 NOTE — Care Management Note (Signed)
Case Management Note  Patient Details  Name: Chris Lopez MRN: [id number] Date of Birth: 1945/10/26  Subjective/Objective:   Pt admitted with sepsis, PNA and septic emboli              Action/Plan:  PTA from home with wife   Expected Discharge Date:                  Expected Discharge Plan:  (From home with wife)  In-House Referral:  Clinical Social Work  Discharge planning Services  CM Consult  Post Acute Care Choice:    Choice offered to:     DME Arranged:    DME Agency:     HH Arranged:    [place] Agency:     Status of Service:     If discussed at [employer], dates discussed:    Additional Comments: 02/25/2018  Pt is now on/off BIPAP including HFNC.  Pt has MRSA bacteremia and will need long term IV antibiotics.  Pt on Amio and Cardizem drip for A fib, IV steroids.  LTACH referral ordered - CM discussed with attending Dr Chase Caller - attending feels that pt would not be appropriate for discharge to  [facility]  until next week based on current acuity- if pt remains appropriate at that time CM is to make official referral.  CM provided tentative referral to both [facility] and will continue to follow pt  02/20/18 Pt now requiring continuous BIPAP, on Cardizem and amiodarone drip for a fib.  CM will continue to follow for discharge needs Maryclare Labrador, RN 02/25/2018, 12:04 PM

## 2018-02-25 NOTE — Progress Notes (Deleted)
[address], [place] [address]   CLINIC:  Medical Oncology/Hematology  PCP:  Celene Squibb, MD [place] [address] [phone]   REASON FOR VISIT: Follow-up for mantle cell lymphoma   CURRENT THERAPY: Rituximab and Bendamustine  BRIEF ONCOLOGIC HISTORY:    Lymphoma, mantle cell ([place])   10/17/2017 Initial Diagnosis    Mantle cell lymphoma ([place])    10/31/2017 -  Chemotherapy    The patient had palonosetron (ALOXI) injection 0.25 mg, 0.25 mg, Intravenous,  Once, 4 of 4 cycles Administration: 0.25 mg (11/04/2017), 0.25 mg (12/02/2017), 0.25 mg (12/30/2017), 0.25 mg (01/28/2018) pegfilgrastim (NEULASTA ONPRO KIT) injection 6 mg, 6 mg, Subcutaneous, Once, 4 of 4 cycles Administration: 6 mg (11/05/2017), 6 mg (12/03/2017), 6 mg (12/31/2017), 6 mg (01/29/2018) riTUXimab (RITUXAN) 800 mg in sodium chloride 0.9 % 250 mL (2.4242 mg/mL) infusion, 375 mg/m2 = 800 mg, Intravenous,  Once, 4 of 4 cycles Administration: 800 mg (11/04/2017), 800 mg (12/02/2017), 800 mg (12/30/2017), 800 mg (01/28/2018) bendamustine (BENDEKA) 200 mg in sodium chloride 0.9 % 50 mL (3.4483 mg/mL) chemo infusion, 90 mg/m2 = 200 mg, Intravenous,  Once, 4 of 4 cycles Administration: 200 mg (11/04/2017), 200 mg (11/05/2017), 200 mg (12/02/2017), 200 mg (12/30/2017), 200 mg (12/03/2017), 200 mg (12/31/2017), 200 mg (01/28/2018), 200 mg (01/29/2018)  for chemotherapy treatment.       INTERVAL HISTORY:  Mr. Chris Lopez 72 y.o. male returns for routine follow-up    REVIEW OF SYSTEMS:  Review of Systems - Oncology   PAST MEDICAL/SURGICAL HISTORY:  Past Medical History:  Diagnosis Date  . Anxiety   . Arthritis   . BPH (benign prostatic hyperplasia)   . Chronic bronchitis   . COPD (chronic obstructive pulmonary disease) ([place])   . Depression   . Diabetes mellitus without complication ([place])    Resolved; not needing medications at this time.  Marland Kitchen GERD (gastroesophageal reflux  disease)   . Hypertension   . Lymphoma, mantle cell ([place])    Past Surgical History:  Procedure Laterality Date  . BIOPSY  10/07/2017   Procedure: BIOPSY;  Surgeon: Daneil Dolin, MD;  [facility];  Service: Endoscopy;;  gastroesophageal junction bx ileocecal valve  . carpal tunnel right hand    . CERVICAL FUSION    . COLONOSCOPY WITH PROPOFOL N/A 10/07/2017   Procedure: COLONOSCOPY WITH PROPOFOL;  Surgeon: Daneil Dolin, MD;  Location: [facility];  Service: Endoscopy;  Laterality: N/A;  10:00am  . ESOPHAGOGASTRODUODENOSCOPY (EGD) WITH PROPOFOL N/A 10/07/2017   Procedure: ESOPHAGOGASTRODUODENOSCOPY (EGD) WITH PROPOFOL;  Surgeon: Daneil Dolin, MD;  Location: [facility];  Service: Endoscopy;  Laterality: N/A;  . IR IMAGING GUIDED PORT INSERTION  10/30/2017  . IR REMOVAL TUN ACCESS W/ PORT W/O FL MOD SED  02/18/2018  . left knee replacement    . left knee revised X 3    . multiple knee arthroscopies    . POLYPECTOMY  10/07/2017   Procedure: POLYPECTOMY;  Surgeon: Daneil Dolin, MD;  Location: [facility];  Service: Endoscopy;;  colon  . right knee replacement    . tarsal tunnel repair    . THYROIDECTOMY, PARTIAL    . TONSILLECTOMY       SOCIAL HISTORY:  Social History   Socioeconomic History  . Marital status: Married    Spouse name: Jonelle Sidle  . Number of children: 4  . Years of education: Not on file  .  Highest education level: Bachelor's degree (e.g., BA, AB, BS)  Occupational History  . 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 Financial resource strain: Somewhat hard  . Food insecurity:    Worry: Sometimes true    Inability: Never true  . Transportation needs:    Medical: No    Non-medical: No  Tobacco Use  . Smoking status: Current Every Day Smoker    Packs/day: 1.25    Years: 50.00    Pack years: 62.50     Types: Cigarettes  . Smokeless tobacco: Never Used  Substance and Sexual Activity  . Alcohol use: No  . Drug use: No  . Sexual activity: Yes    Partners: Female    Birth control/protection: None    Comment: spouse  Lifestyle  . Physical activity:    Days per week: 3 days    Minutes per session: 150+ min  . Stress: Very much  Relationships  . Social connections:    Talks on phone: Once a week    Gets together: Never    Attends religious service: Never    Active member of club or organization: No    Attends meetings of clubs or organizations: Never    Relationship status: Married  . Intimate partner violence:    Fear of current or ex partner: No    Emotionally abused: No    Physically abused: No    Forced sexual activity: No  Other Topics Concern  . Not on file  Social History Narrative  . Not on file    FAMILY HISTORY:  Family History  Problem Relation Age of Onset  . Colon cancer Maternal Grandmother   . Colon cancer Paternal Grandmother   . Heart disease Mother   . Arthritis Mother   . Prostate cancer Father   . Heart attack Brother   . Heart disease Maternal Aunt   . Cancer Maternal Aunt   . Heart disease Maternal Uncle   . Cancer Maternal Uncle   . Heart disease Paternal Aunt   . Heart disease Paternal Uncle   . Heart disease Brother   . Heart attack Brother   . Arthritis Brother     CURRENT MEDICATIONS:  Facility-Administered Encounter Medications as of 02/25/2018  Medication  . 0.9 %  sodium chloride infusion  . acetaminophen (TYLENOL) tablet 650 mg  . amiodarone (NEXTERONE PREMIX) 360-4.14 MG/200ML-% (1.8 mg/mL) IV infusion  . chlorhexidine (PERIDEX) 0.12 % solution 15 mL  . DAPTOmycin (CUBICIN) 800 mg in sodium chloride 0.9 % IVPB  . diltiazem (CARDIZEM) 100 mg in dextrose 5% 186m (1 mg/mL) infusion  . doxycycline (VIBRAMYCIN) 100 mg in sodium chloride 0.9 % 250 mL IVPB  . famotidine (PEPCID) tablet 20 mg  . fentaNYL (SUBLIMAZE) injection 50  mcg  . Glycerin (Adult) 2.1 g suppository 1 suppository  . guaiFENesin (MUCINEX) 12 hr tablet 1,200 mg  . insulin aspart (novoLOG) injection 0-20 Units  . insulin aspart (novoLOG) injection 0-5 Units  . ipratropium (ATROVENT) nebulizer solution 0.5 mg  . levalbuterol (XOPENEX) nebulizer solution 0.63 mg  . MEDLINE mouth rinse  . methylPREDNISolone sodium succinate (SOLU-MEDROL) 125 mg/2 mL injection 40 mg  . metoprolol tartrate (LOPRESSOR) injection 5 mg  . ondansetron (ZOFRAN) injection 4 mg   Outpatient Encounter Medications as of 02/25/2018  Medication  Sig Note  . acyclovir (ZOVIRAX) 400 MG tablet Take 1 tablet (400 mg total) by mouth 2 (two) times daily. 02/16/2018: Newly prescribed (not taking)  . albuterol (PROVENTIL HFA;VENTOLIN HFA) 108 (90 Base) MCG/ACT inhaler Inhale 2 puffs into the lungs every 6 (six) hours as needed for wheezing or shortness of breath.   . allopurinol (ZYLOPRIM) 300 MG tablet Take 1 tablet (300 mg total) by mouth daily. (Patient not taking: Reported on 02/16/2018)   . amLODipine (NORVASC) 5 MG tablet Take 5 mg by mouth every evening.   Marland Kitchen aspirin 325 MG tablet Take 325 mg by mouth daily.    Marland Kitchen atorvastatin (LIPITOR) 20 MG tablet Take 20 mg by mouth every evening. Reported on 06/07/2015   . bendamustine in sodium chloride 0.9 % 50 mL Inject into the vein once.   . diclofenac (VOLTAREN) 75 MG EC tablet TAKE 1 TABLET TWICE DAILY   . diphenhydrAMINE (BENADRYL) 25 MG tablet Take 1 tablet (25 mg total) by mouth every 6 (six) hours as needed for itching.   . gabapentin (NEURONTIN) 600 MG tablet Take 600 mg by mouth 3 (three) times daily.   Marland Kitchen lidocaine-prilocaine (EMLA) cream Apply to affected area once   . LORazepam (ATIVAN) 2 MG tablet Take 2 mg by mouth at bedtime.   . metoprolol (LOPRESSOR) 50 MG tablet Take 50 mg by mouth every evening.    . nitroGLYCERIN (NITROSTAT) 0.4 MG SL tablet Place 0.4 mg under the tongue every 5 (five) minutes x 3 doses as needed. For chest  pain   . omeprazole (PRILOSEC OTC) 20 MG tablet Take 20 mg by mouth 2 (two) times daily. Reported on 06/07/2015   . oxyCODONE-acetaminophen (PERCOCET) 7.5-325 MG tablet Take 1 tablet by mouth every 4 (four) hours as needed. 02/16/2018: LF @ [facility] on 02-06-18 #90 DS 15  . prochlorperazine (COMPAZINE) 10 MG tablet Take 1 tablet (10 mg total) by mouth every 6 (six) hours as needed (Nausea or vomiting).   . riTUXimab (RITUXAN IV) Inject into the vein.   . Tamsulosin HCl (FLOMAX) 0.4 MG CAPS Take 0.4 mg by mouth daily. Reported on 06/07/2015   . venlafaxine (EFFEXOR-XR) 150 MG 24 hr capsule Take 150 mg by mouth every evening.      ALLERGIES:  Allergies  Allergen Reactions  . Bee Venom Anaphylaxis    Allergic to bee stings not the kit  . Feldene [Piroxicam] Rash and Dermatitis     PHYSICAL EXAM:  ECOG Performance status: 1  There were no vitals filed for this visit. There were no vitals filed for this visit.  Physical Exam   LABORATORY DATA:  I have reviewed the labs as listed.  CBC    Component Value Date/Time   WBC 11.1 (H) 02/25/2018 0611   RBC 3.22 (L) 02/25/2018 0611   HGB 9.1 (L) 02/25/2018 0611   HCT 25.5 (L) 02/25/2018 0611   PLT 61 (L) 02/25/2018 0611   MCV 79.2 (L) 02/25/2018 0611   MCH 28.3 02/25/2018 0611   MCHC 35.7 02/25/2018 0611   RDW 18.0 (H) 02/25/2018 0611   LYMPHSABS 0.0 (L) 02/21/2018 1106   MONOABS 0.2 02/21/2018 1106   EOSABS 0.0 02/21/2018 1106   BASOSABS 0.0 02/21/2018 1106   CMP Latest Ref Rng & Units 02/25/2018 02/24/2018 02/23/2018  Glucose 70 - 99 mg/dL 202(H) 173(H) 180(H)  BUN 8 - 23 mg/dL 207(H) 214(H) 209(H)  Creatinine 0.61 - 1.24 mg/dL 4.47(H) 4.75(H) 4.92(H)  Sodium 135 - 145 mmol/L 147(H) 145  144  Potassium 3.5 - 5.1 mmol/L 3.3(L) 5.0 4.9  Chloride 98 - 111 mmol/L 106 105 105  CO2 22 - 32 mmol/L 25 21(L) 20(L)  Calcium 8.9 - 10.3 mg/dL 8.3(L) 8.3(L) 8.6(L)  Total Protein 6.5 - 8.1 g/dL - - -  Total Bilirubin 0.3 - 1.2 mg/dL - - -    Alkaline Phos 38 - 126 U/L - - -  AST 15 - 41 U/L - - -  ALT 0 - 44 U/L - - -       DIAGNOSTIC IMAGING:  *The following radiologic images and reports have been reviewed independently and agree with below findings.      ASSESSMENT & PLAN:   No problem-specific Assessment & Plan notes found for this encounter.      Orders placed this encounter:  No orders of the defined types were placed in this encounter.     Derek Jack, MD [place] [phone]

## 2018-02-26 ENCOUNTER — Ambulatory Visit (HOSPITAL_COMMUNITY): Payer: Medicare HMO

## 2018-02-26 ENCOUNTER — Telehealth: Payer: Self-pay

## 2018-02-26 DIAGNOSIS — R0902 Hypoxemia: Secondary | ICD-10-CM

## 2018-02-26 LAB — GLUCOSE, CAPILLARY: Glucose-Capillary: 149 mg/dL — ABNORMAL HIGH (ref 70–99)

## 2018-02-26 MED ORDER — NALOXONE HCL 0.4 MG/ML IJ SOLN: 0.4000 mg | INTRAMUSCULAR | Status: DC | PRN

## 2018-02-26 MED ORDER — MORPHINE 100MG IN NS 100ML (1MG/ML) PREMIX INFUSION: 1.0000 mg/h | INTRAVENOUS | Status: DC

## 2018-02-26 MED ORDER — LORAZEPAM 2 MG/ML IJ SOLN: 2.0000 mg | INTRAMUSCULAR | Status: DC | PRN

## 2018-02-26 MED ORDER — DIPHENHYDRAMINE HCL 12.5 MG/5ML PO ELIX
12.5000 mg | ORAL_SOLUTION | Freq: Four times a day (QID) | ORAL | Status: DC | PRN
  Filled 2018-02-26: qty 5

## 2018-02-26 MED ORDER — SODIUM CHLORIDE 0.9% FLUSH: 9.0000 mL | INTRAVENOUS | Status: DC | PRN

## 2018-02-26 MED ORDER — DIPHENHYDRAMINE HCL 50 MG/ML IJ SOLN: 12.5000 mg | Freq: Four times a day (QID) | INTRAMUSCULAR | Status: DC | PRN

## 2018-03-04 ENCOUNTER — Other Ambulatory Visit (HOSPITAL_COMMUNITY): Payer: Medicare HMO

## 2018-03-04 ENCOUNTER — Ambulatory Visit (HOSPITAL_COMMUNITY): Payer: Medicare HMO | Admitting: Hematology

## 2018-03-04 ENCOUNTER — Ambulatory Visit (HOSPITAL_COMMUNITY): Payer: Medicare HMO

## 2018-03-05 ENCOUNTER — Ambulatory Visit (HOSPITAL_COMMUNITY): Payer: Medicare HMO

## 2018-03-09 NOTE — Progress Notes (Signed)
Pt expired at 0336 with wife at bedside.  Absence of heart tones verified with Jabier Gauss, RN.

## 2018-03-09 NOTE — Progress Notes (Signed)
[facility] Progress Note Patient Name: Chris Lopez DOB: [birth year] MRN: [id number]   Date of Service  03-03-18  HPI/Events of Note  DNR with declining clinical status. Family being contacted to update and possibly transition to comfort measures  eICU Interventions  No eicu interventions, await family arrival        Frederik Pear 03/03/2018, 12:37 AM

## 2018-03-09 NOTE — Progress Notes (Signed)
Dr. Lucile Shutters in [place] made aware that pt is possibly too obtunded at this time to utilize PCA pump.

## 2018-03-09 NOTE — Telephone Encounter (Signed)
On 03/11/18 I received a d/c from [facility] (faxed). The d/c is for cremation.  The patient is a patient of Doctor Ramaswamy.   DC will be taken to [phone] for signature.  On 02/28/18 I received the dc from Doctor Ramaswamy. I got the dc ready and faxed the dc to the funeral home per the funeral home request.

## 2018-03-09 NOTE — Progress Notes (Signed)
Pt's spouse Evaan Tidwell made aware of pt's change in condition via telephone.

## 2018-03-09 NOTE — Progress Notes (Signed)
Contacted [facility]-link for pt's change in condition.  Awaiting Dr. Lucile Shutters to call back.

## 2018-03-09 NOTE — Progress Notes (Signed)
eLink Physician-Brief Progress Note Patient Name: LONIE NEWSHAM DOB: Feb 04, 1946 MRN: [id number]   Date of Service  03-28-2018  HPI/Events of Note  Pt declining clinically. Family requests transition to comfort measures.  eICU Interventions  Order for comfort morphine and ativan entered. Full comfort orders to be entered by primary PCCM attending physician in AM        [facility] Elayna Tobler 28-Mar-2018, 1:33 AM

## 2018-03-09 DEATH — deceased

## 2018-03-12 ENCOUNTER — Telehealth: Payer: Self-pay

## 2018-03-12 NOTE — Telephone Encounter (Signed)
On 03/12/18 I received a d/c from [facility] (original). DC is for cremation.  Patient is a patient of Doctor Ramaswamy.  DC will be taken to Pulmonary Unit for signature.  ON 03/14/18 I received dc back from Doctor Ramaswamy. I got the dc ready and called the funeral home to let the know the dc was mailed to [facility] [employer] per the funeral home request.

## 2018-04-09 NOTE — Discharge Summary (Signed)
DISCHARGE SUMMARY    Date of admit: 02/13/2018 12:42 PM Date of discharge: 03/07/18  5:00 AM Length of Stay: 11 days  PCP is Celene Squibb, MD   PROBLEM LIST Active Problems:   Sepsis ([place]) - MRSA BActeremia   Severe sepsis ([place])   Septic shock ([place])   AKI (acute kidney injury) ([place])   Hyponatremia   Acute respiratory failure ([place])   Acute renal failure ([place])   Bacteremia   Atrial fibrillation with rapid ventricular response (H[facility])   Elevated troponin   Acute diastolic CHF (congestive heart failure) (H[facility])   Malnutrition of moderate degree   Pressure injury of skin   Hypoxia    SUMMARY Chris Lopez was 73 y.o. patient with    has a past medical history of Anxiety, Arthritis, BPH (benign prostatic hyperplasia), Chronic bronchitis, COPD (chronic obstructive pulmonary disease) ([place]), Depression, Diabetes mellitus without complication ([place]), GERD (gastroesophageal reflux disease), Hypertension, and Lymphoma, mantle cell ([place]).   has a past surgical history that includes left knee replacement; left knee revised X 3; right knee replacement; tarsal tunnel repair; carpal tunnel right hand; Cervical fusion; Thyroidectomy, partial; Tonsillectomy; multiple knee arthroscopies; Colonoscopy with propofol (N/A, 10/07/2017); Esophagogastroduodenoscopy (egd) with propofol (N/A, 10/07/2017); biopsy (10/07/2017); polypectomy (10/07/2017); IR IMAGING GUIDED PORT INSERTION (10/30/2017); and IR REMOVAL TUN ACCESS W/ PORT W/O FL MOD SED (02/18/2018).   Admitted on 02/10/2018 with  Brief History   73 yo male with Stage IVb Mantle cell lymphoma on chemotherapy 2 weeks prior to admission (bendamustine, rituximab) presented with generalized body aches, hypotension from sepsis related to MRSA bacteremia complicated by pneumonia with septic emboli, acute hypoxic respiratory failure, a fib with RVR and acute renal failure.  Past Medical History  BPH, COPD, Depression, DM, GERD, HTN  Significant  Hospital Events   11/09 admit from [facility] ED. Blood culture with MRSA 11/10 DNR 11/11 start IV amiodarone 11/12 removal of Rt chest port 11/13 add diltiazem IV 11/13- HRCT chest 11/13 >> atherosclerosis, diffuse GGO b/l more at bases, consolidation b/l lower lobes Lt > Rt, scattered cavitary nodules, mild air trapping, mild centrilobular and paraseptal emphysema 11/15 add trial of solumedrol for possible chemo pulmonary toxicity 11/16 add HCO3 to IV fluid 11/18 tolerating HF oxygen during the day, d/c HCO3 from IV fluid 1/19 - not improving. Needing bipap. Later progressive decline. Comfort care measures with morphine gtt initiated and patient died 03-07-18          SIGNED Dr. Brand Males, M.D., F.C.C.P Pulmonary and Critical Care Medicine Staff Physician [facility] Pager: [phone], If no answer or between  15:00h - 7:00h: call [phone]  03/20/2018 5:07 AM

## 2019-03-03 IMAGING — XA IR FLUORO GUIDE CV LINE*L*
1 series · 1 of 1 positions shown · non-contrast
Comparison: none

CLINICAL DATA: Mantle cell lymphoma and need for to begin
chemotherapy.

[Series 300: fl - angio · 1 of 1 slices shown]
[im 1/1]
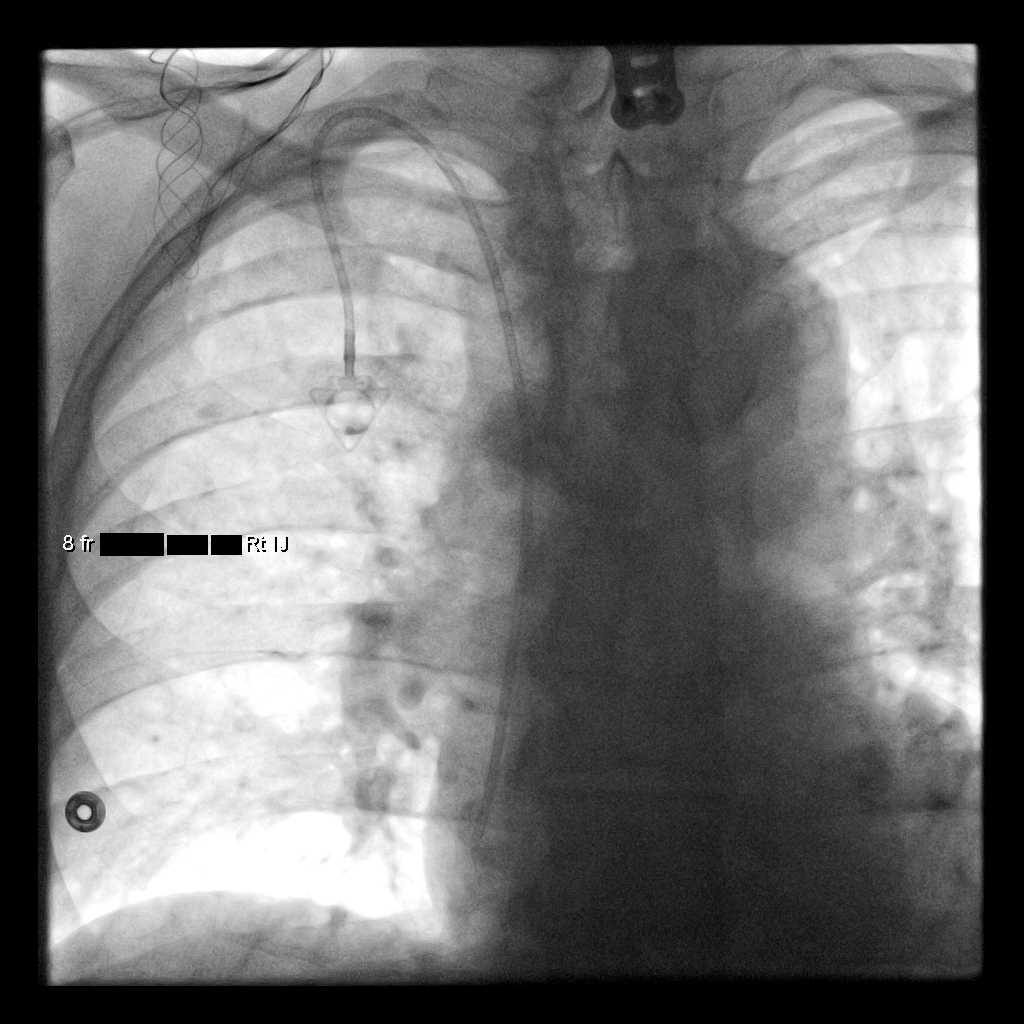

[1 of 1 positions shown; findings below may reference images not displayed]

EXAM:
IMPLANTED PORT A CATH PLACEMENT WITH ULTRASOUND AND FLUOROSCOPIC
GUIDANCE

ANESTHESIA/SEDATION:
2.0 mg IV Versed; 100 mcg IV Fentanyl

Total Moderate Sedation Time:  27 minutes

The patient's level of consciousness and physiologic status were
continuously monitored during the procedure by Radiology nursing.

Additional Medications: 2 g IV Ancef.

FLUOROSCOPY TIME:  18 seconds.  4.6 mGy.

PROCEDURE:
The procedure, risks, benefits, and alternatives were explained to
the patient. Questions regarding the procedure were encouraged and
answered. The patient understands and consents to the procedure. A
time-out was performed prior to initiating the procedure.

Ultrasound was utilized to confirm patency of the right internal
jugular vein. The right neck and chest were prepped with
chlorhexidine in a sterile fashion, and a sterile drape was applied
covering the operative field. Maximum barrier sterile technique with
sterile gowns and gloves were used for the procedure. Local
anesthesia was provided with 1% lidocaine.

After creating a small venotomy incision, a 21 gauge needle was
advanced into the right internal jugular vein under direct,
real-time ultrasound guidance. Ultrasound image documentation was
performed. After securing guidewire access, an 8 Fr dilator was
placed. A J-wire was kinked to measure appropriate catheter length.

A subcutaneous port pocket was then created along the upper chest
wall utilizing sharp and blunt dissection. Portable cautery was
utilized. The pocket was irrigated with sterile saline.

A single lumen power injectable port was chosen for placement. The 8
Fr catheter was tunneled from the port pocket site to the venotomy
incision. The port was placed in the pocket. External catheter was
trimmed to appropriate length based on guidewire measurement.

At the venotomy, an 8 Fr peel-away sheath was placed over a
guidewire. The catheter was then placed through the sheath and the
sheath removed. Final catheter positioning was confirmed and
documented with a fluoroscopic spot image. The port was accessed
with a needle and aspirated and flushed with heparinized saline. The
access needle was removed.

The venotomy and port pocket incisions were closed with subcutaneous
3-0 Monocryl and subcuticular 4-0 Vicryl. Dermabond was applied to
both incisions.

COMPLICATIONS:
COMPLICATIONS
None
FINDINGS: After catheter placement, the tip lies at the Huh junction.
The catheter aspirates normally and is ready for immediate use.
IMPRESSION: Placement of single lumen port a cath via right internal jugular
vein. The catheter tip lies at the Huh junction. A power
injectable port a cath was placed and is ready for immediate use.

## 2019-06-21 IMAGING — DX DG ANKLE PORT 2V*R*
2 series · 2 of 2 positions shown · non-contrast
Comparison: None.

CLINICAL DATA: Pain with redness and swelling.  No known trauma.

EXAM:
PORTABLE RIGHT ANKLE - 2 VIEW

[ankle lat]
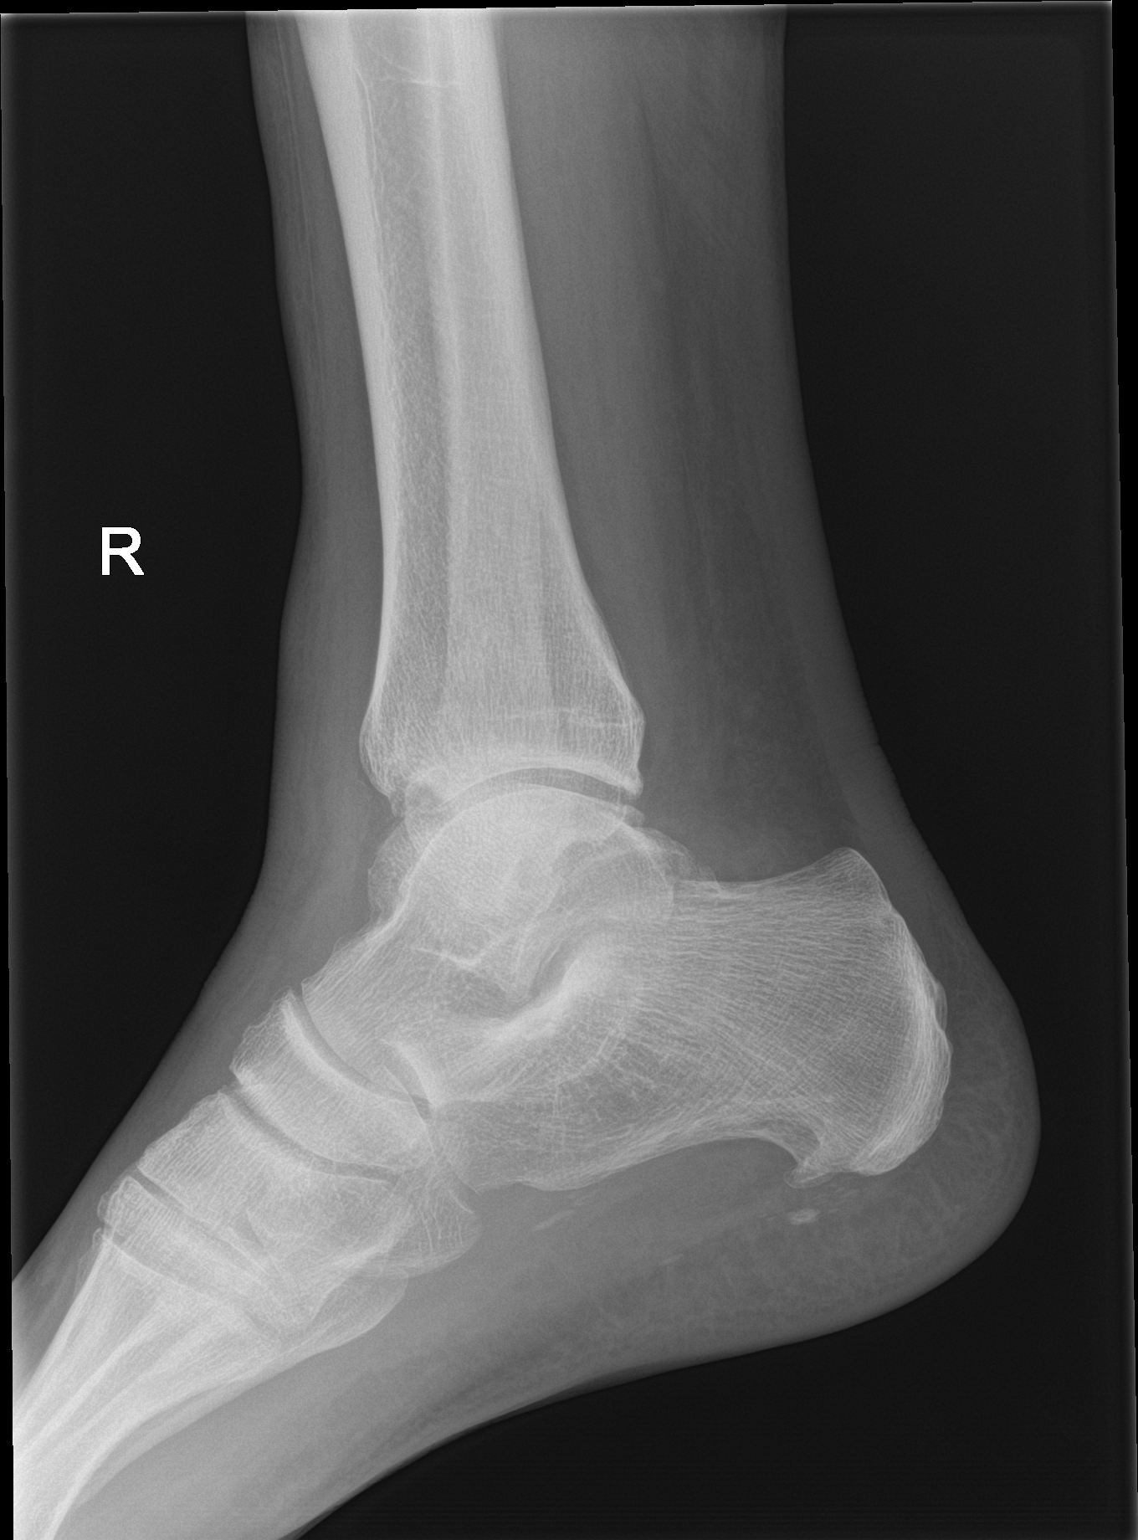

[ankle ap]
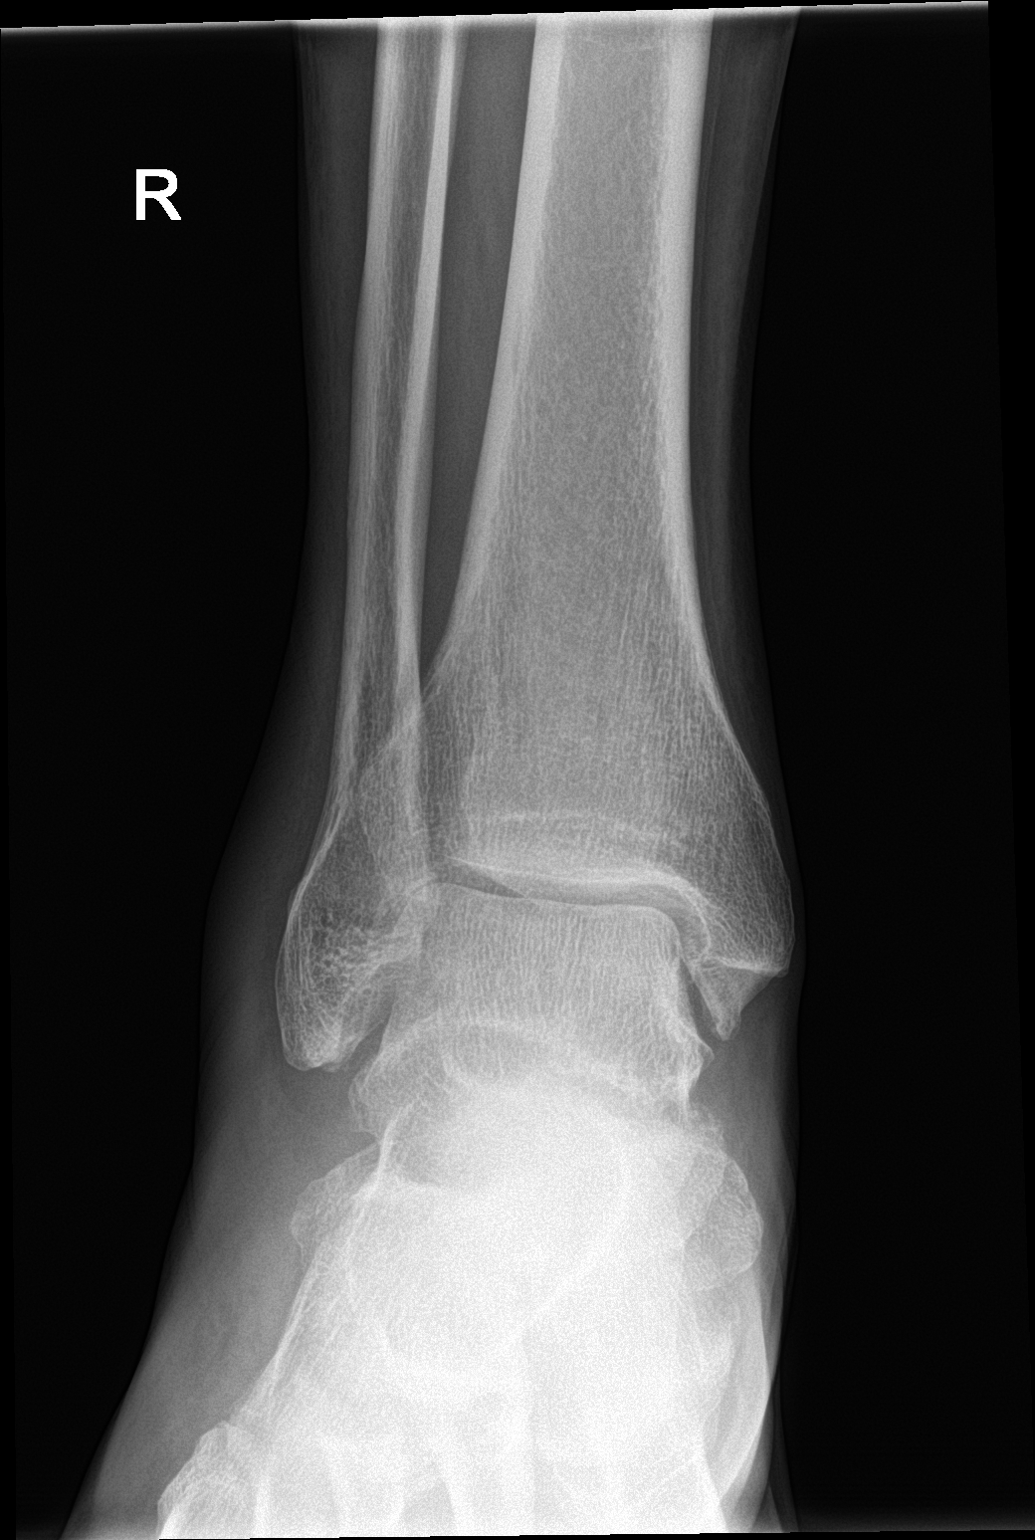

[2 of 2 positions shown; findings below may reference images not displayed]

FINDINGS: Soft tissue swelling, particularly laterally. Calcification in the
plantar fascia. No acute fracture. Lucency over the anterior aspect
of the distal tibia on the lateral view only. No other acute
abnormalities.
IMPRESSION: 1. Soft tissue swelling.
2. No acute fracture.
3. Rounded in the anterior aspect of they tibia on the lateral view
only is age indeterminate. Recommend clinical correlation.

## 2019-06-21 IMAGING — DX DG CHEST 1V PORT
1 series · 1 of 1 positions shown · non-contrast
Comparison: Yesterday

CLINICAL DATA: Acute respiratory failure

EXAM:
PORTABLE CHEST 1 VIEW

[chest]
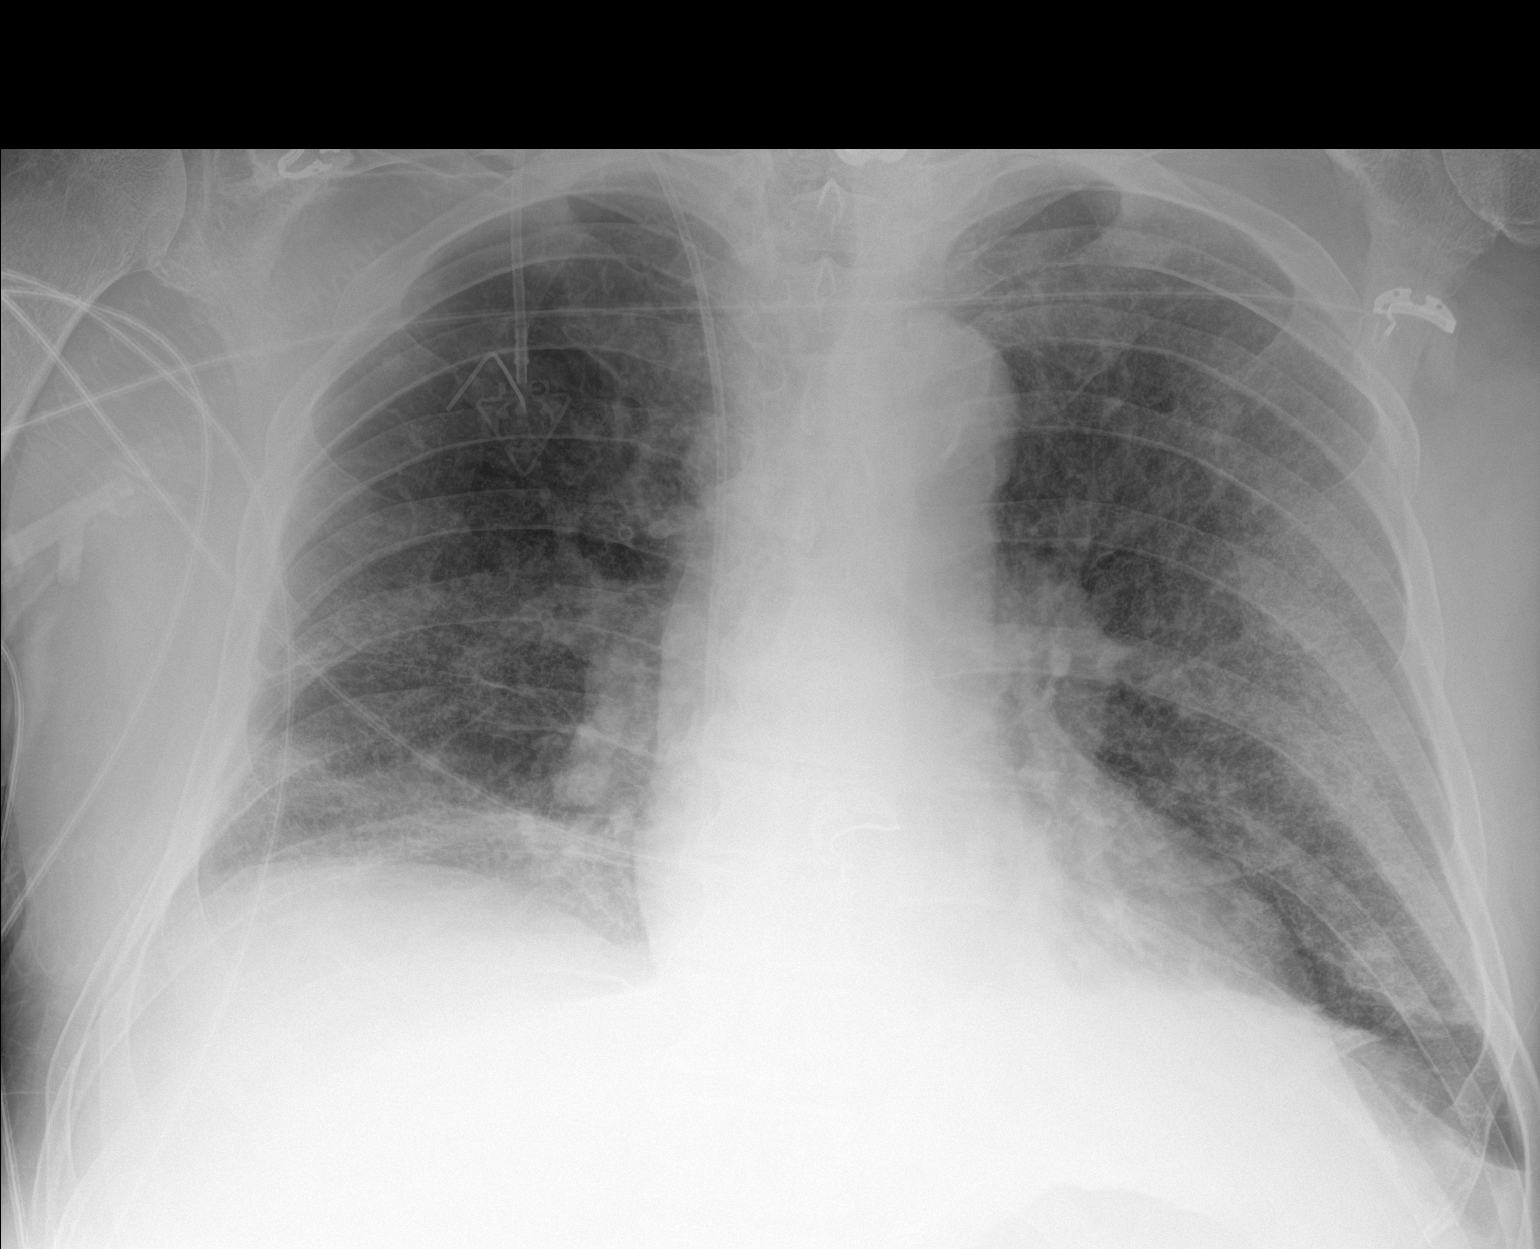

[1 of 1 positions shown; findings below may reference images not displayed]

FINDINGS: Right-sided port catheter with tip at the upper right atrium.

Low volumes with generalized interstitial coarsening. Asymmetric
opacity in the right mid lower lung slight perihilar improvement
from before.

No effusion or pneumothorax.  Normal heart size.
IMPRESSION: 1. Low volumes with bilateral interstitial coarsening favoring
edema.
2. Asymmetric hazy lung opacity which may be atelectasis or
pneumonia.
